# Patient Record
Sex: Male | Born: 1965 | Race: White | Hispanic: No | Marital: Married | Smoking: Never smoker
Health system: Southern US, Community
[De-identification: ages and names within clinical notes are randomized; demographics above are authoritative.]

## PROBLEM LIST (undated history)

## (undated) DIAGNOSIS — M109 Gout, unspecified: Secondary | ICD-10-CM

## (undated) DIAGNOSIS — K219 Gastro-esophageal reflux disease without esophagitis: Secondary | ICD-10-CM

## (undated) DIAGNOSIS — E785 Hyperlipidemia, unspecified: Secondary | ICD-10-CM

## (undated) DIAGNOSIS — I10 Essential (primary) hypertension: Secondary | ICD-10-CM

## (undated) HISTORY — PX: ABDOMINAL HERNIA REPAIR: SHX539

## (undated) HISTORY — DX: Gastro-esophageal reflux disease without esophagitis: K21.9

## (undated) HISTORY — DX: Essential (primary) hypertension: I10

## (undated) HISTORY — PX: HERNIA REPAIR: SHX51

---

## 2000-03-22 ENCOUNTER — Ambulatory Visit (HOSPITAL_BASED_OUTPATIENT_CLINIC_OR_DEPARTMENT_OTHER): Admission: RE | Admit: 2000-03-22 | Discharge: 2000-03-23 | Payer: Self-pay | Admitting: General Surgery

## 2012-10-14 ENCOUNTER — Ambulatory Visit (INDEPENDENT_AMBULATORY_CARE_PROVIDER_SITE_OTHER): Payer: BC Managed Care – PPO

## 2012-10-14 ENCOUNTER — Other Ambulatory Visit: Payer: Self-pay | Admitting: Orthopedic Surgery

## 2012-10-14 DIAGNOSIS — M25559 Pain in unspecified hip: Secondary | ICD-10-CM

## 2012-10-14 DIAGNOSIS — R52 Pain, unspecified: Secondary | ICD-10-CM

## 2013-05-26 LAB — LIPID PANEL
Cholesterol: 178 mg/dL (ref 0–200)
HDL: 30 mg/dL — AB (ref 35–70)
LDL Cholesterol: 118 mg/dL
Triglycerides: 149 mg/dL (ref 40–160)

## 2013-05-26 LAB — CBC AND DIFFERENTIAL
HCT: 13 % — AB (ref 41–53)
Hemoglobin: 40 g/dL — AB (ref 13.5–17.5)
PLATELETS: 184 10*3/uL (ref 150–399)
WBC: 5.8 10^3/mL

## 2013-05-26 LAB — BASIC METABOLIC PANEL
Creatinine: 1.2 mg/dL (ref 0.6–1.3)
Glucose: 103 mg/dL
Potassium: 3.9 mmol/L (ref 3.4–5.3)
Sodium: 141 mmol/L (ref 137–147)

## 2013-05-26 LAB — HEPATIC FUNCTION PANEL
ALT: 20 U/L (ref 10–40)
AST: 18 U/L (ref 14–40)

## 2013-05-26 LAB — TSH: TSH: 1.83 u[IU]/mL (ref 0.41–5.90)

## 2014-05-07 ENCOUNTER — Ambulatory Visit (INDEPENDENT_AMBULATORY_CARE_PROVIDER_SITE_OTHER): Payer: BC Managed Care – PPO | Admitting: Family Medicine

## 2014-05-07 ENCOUNTER — Encounter: Payer: Self-pay | Admitting: Family Medicine

## 2014-05-07 VITALS — BP 131/86 | HR 84 | Ht 68.0 in | Wt 188.0 lb

## 2014-05-07 DIAGNOSIS — I10 Essential (primary) hypertension: Secondary | ICD-10-CM

## 2014-05-07 DIAGNOSIS — K219 Gastro-esophageal reflux disease without esophagitis: Secondary | ICD-10-CM | POA: Insufficient documentation

## 2014-05-07 DIAGNOSIS — R252 Cramp and spasm: Secondary | ICD-10-CM

## 2014-05-07 DIAGNOSIS — R079 Chest pain, unspecified: Secondary | ICD-10-CM

## 2014-05-07 NOTE — Progress Notes (Signed)
CC: Damon Conrad is a 48 y.o. male is here for Establish Care and Chest Pain   Subjective: HPI:  Pleasant 48 year old here to establish care  Patient reports episodes of chest pain have been occurring over the past one to 2 months. They're not predictable. He describes 2 distinct recurring episodes one of which is described as a sharpness in his left breast that occurs for only a second and comes and goes in both exertional and rest situations. He's only had 2 of these over the last 2 months. He also describes a chest heaviness that comes on gradually and will persist for about 30 minutes nothing particularly makes better or worse it has only occurred when he is exerting himself at work though. He believes he's had a handful of these episodes over the past 2 months.  For the past week he's begun starting taking an aspirin daily but there's been no other interventions. He is not a smoker and has no family history of heart disease. He denies any pulmonary complaints or muscle skeletal complaints, specifically he's been unable to reproduce any component of his above pains by pressing on his chest.  Complains of right calf cramping that occurs most nights of the week over the past months. He described as a severe tightness that improves with stretching. Nothing particular makes better or worse, he's tried stopping his blood pressure medication for a few days however it did not help or worsen his cramping. He denies swelling of the extremities nor edema. Denies weakness or limb claudication when active  Review of Systems - General ROS: negative for - chills, fever, night sweats, weight gain or weight loss Ophthalmic ROS: negative for - decreased vision Psychological ROS: negative for - anxiety or depression ENT ROS: negative for - hearing change, nasal congestion, tinnitus or allergies Hematological and Lymphatic ROS: negative for - bleeding problems, bruising or swollen lymph nodes Breast ROS:  negative Respiratory ROS: no cough, shortness of breath, or wheezing Cardiovascular ROS: no dyspnea on exertion Gastrointestinal ROS: no abdominal pain, change in bowel habits, or black or bloody stools Genito-Urinary ROS: negative for - genital discharge, genital ulcers, incontinence or abnormal bleeding from genitals Musculoskeletal ROS: negative for - joint pain or muscle pain other than that described above Neurological ROS: negative for - headaches or memory loss Dermatological ROS: negative for lumps, mole changes, rash and skin lesion changes  History reviewed. No pertinent past medical history.  Past Surgical History  Procedure Laterality Date  . Abdominal hernia repair      x 2   Family History  Problem Relation Age of Onset  . Alcohol abuse Father   . Cancer Mother   . Hypertension Mother     History   Social History  . Marital Status: Single    Spouse Name: N/A    Number of Children: N/A  . Years of Education: N/A   Occupational History  . Not on file.   Social History Main Topics  . Smoking status: Never Smoker   . Smokeless tobacco: Not on file  . Alcohol Use: No  . Drug Use: No  . Sexual Activity: Yes    Partners: Female   Other Topics Concern  . Not on file   Social History Narrative  . No narrative on file     Objective: BP 131/86  Pulse 84  Ht 5\' 8"  (1.727 m)  Wt 188 lb (85.276 kg)  BMI 28.59 kg/m2  General: Alert and Oriented, No Acute Distress  HEENT: Pupils equal, round, reactive to light. Conjunctivae clear.  Moist membranes pharynx unremarkable Lungs: Clear to auscultation bilaterally, no wheezing/ronchi/rales.  Comfortable work of breathing. Good air movement. Cardiac: Regular rate and rhythm. Normal S1/S2.  No murmurs, rubs, nor gallops.  No carotid bruits Abdomen: Mild obesity soft nontender Extremities: No peripheral edema.  Strong peripheral pulses. Full range of motion and strength of the right knee and ankle. Pain is not  reproduced with palpation of the gastroc muscle belly. No overlying skin changes at the site of his calf pain. Mental Status: No depression, anxiety, nor agitation. Skin: Warm and dry. No overlying skin changes on the chest  Assessment & Plan: Damon Conrad was seen today for establish care and chest pain.  Diagnoses and associated orders for this visit:  Essential hypertension, benign  Gastroesophageal reflux disease, esophagitis presence not specified  Chest pain, unspecified chest pain type - PR ELECTROCARDIOGRAM, COMPLETE - COMPLETE METABOLIC PANEL WITH GFR - CBC - Lipid panel - Ambulatory referral to Cardiology  Calf cramp    Essential hypertension: Controlled continue Diovan pending metabolic panel Calf cramp: High suspicion of electrolyte abnormality therefore checking metabolic panel GERD: Continue Nexium Chest pain: Rule out anemia, checking labs above to look for cardiac risk factors. Discussed my suspicion that his chest pressure is angina. I encouraged him to continue taking aspirin daily until he can be seen by cardiology, I believe it is very likely that he will need a stress test and I have prepared him for this possibility. An EKG was obtained showing normal sinus rhythm, normal axis, normal intervals, a Q wave in III but no other pathologic Q waves. There was no ST segment elevation or depression, T wave morphology was unremarkable.   Return in about 4 weeks (around 06/04/2014).

## 2014-05-08 ENCOUNTER — Encounter: Payer: Self-pay | Admitting: Family Medicine

## 2014-05-08 DIAGNOSIS — E785 Hyperlipidemia, unspecified: Secondary | ICD-10-CM | POA: Insufficient documentation

## 2014-05-08 LAB — COMPLETE METABOLIC PANEL WITH GFR
ALBUMIN: 3.8 g/dL (ref 3.5–5.2)
ALT: 22 U/L (ref 0–53)
AST: 22 U/L (ref 0–37)
Alkaline Phosphatase: 67 U/L (ref 39–117)
BUN: 17 mg/dL (ref 6–23)
CALCIUM: 9.3 mg/dL (ref 8.4–10.5)
CHLORIDE: 102 meq/L (ref 96–112)
CO2: 27 mEq/L (ref 19–32)
Creat: 1.25 mg/dL (ref 0.50–1.35)
GFR, Est African American: 78 mL/min
GFR, Est Non African American: 67 mL/min
Glucose, Bld: 90 mg/dL (ref 70–99)
POTASSIUM: 3.8 meq/L (ref 3.5–5.3)
SODIUM: 139 meq/L (ref 135–145)
TOTAL PROTEIN: 7.2 g/dL (ref 6.0–8.3)
Total Bilirubin: 0.5 mg/dL (ref 0.2–1.2)

## 2014-05-08 LAB — CBC
HCT: 41.2 % (ref 39.0–52.0)
Hemoglobin: 13.8 g/dL (ref 13.0–17.0)
MCH: 29.3 pg (ref 26.0–34.0)
MCHC: 33.5 g/dL (ref 30.0–36.0)
MCV: 87.5 fL (ref 78.0–100.0)
PLATELETS: 194 10*3/uL (ref 150–400)
RBC: 4.71 MIL/uL (ref 4.22–5.81)
RDW: 13.7 % (ref 11.5–15.5)
WBC: 6.7 10*3/uL (ref 4.0–10.5)

## 2014-05-08 LAB — LIPID PANEL
Cholesterol: 168 mg/dL (ref 0–200)
HDL: 33 mg/dL — ABNORMAL LOW (ref >39)
LDL CALC: 92 mg/dL (ref 0–99)
Total CHOL/HDL Ratio: 5.1 Ratio
Triglycerides: 213 mg/dL — ABNORMAL HIGH (ref <150)
VLDL: 43 mg/dL — ABNORMAL HIGH (ref 0–40)

## 2014-05-15 ENCOUNTER — Encounter: Payer: Self-pay | Admitting: *Deleted

## 2014-05-16 ENCOUNTER — Ambulatory Visit (INDEPENDENT_AMBULATORY_CARE_PROVIDER_SITE_OTHER): Payer: PRIVATE HEALTH INSURANCE | Admitting: Cardiology

## 2014-05-16 ENCOUNTER — Ambulatory Visit: Payer: PRIVATE HEALTH INSURANCE | Admitting: Cardiology

## 2014-05-16 ENCOUNTER — Encounter: Payer: Self-pay | Admitting: Cardiology

## 2014-05-16 VITALS — BP 130/86 | HR 82 | Ht 68.0 in | Wt 190.0 lb

## 2014-05-16 DIAGNOSIS — R079 Chest pain, unspecified: Secondary | ICD-10-CM

## 2014-05-16 DIAGNOSIS — I10 Essential (primary) hypertension: Secondary | ICD-10-CM

## 2014-05-16 NOTE — Assessment & Plan Note (Signed)
Blood pressure controlled. Continue present medications. 

## 2014-05-16 NOTE — Patient Instructions (Signed)
Your physician recommends that you schedule a follow-up appointment in: AS NEEDED  Your physician has requested that you have an exercise tolerance test. For further information please visit [email or web address]. Please also follow instruction sheet, as given.   Exercise Stress Electrocardiogram An exercise stress electrocardiogram is a test to check how blood flows to your heart. It is done to find areas of poor blood flow. You will need to walk on a treadmill for this test. The electrocardiogram will record your heartbeat when you are at rest and when you are exercising. BEFORE THE PROCEDURE  Do not have drinks with caffeine or foods with caffeine for 24 hours before the test, or as told by your doctor.  Follow your doctor's instructions about eating and drinking before the test.  Ask your doctor what medicines you should or should not take before the test. Take your medicines with water unless told by your doctor not to.  If you use an inhaler, bring it with you to the test.  Bring a snack to eat after the test.  Do not  smoke for 4 hours before the test.  Wear comfortable shoes and clothing. PROCEDURE  You will have patches put on your chest. Small areas of your chest may need to be shaved. Wires will be connected to the patches.  Your heart rate will be watched while you are resting and while you are exercising.  You will walk on the treadmill. The treadmill will slowly get faster to raise your heart rate.  The test will take about 1-2 hours. AFTER THE PROCEDURE  Your heart rate and blood pressure will be watched after the test.  You may return to your normal diet, activities, and medicines or as told by your doctor. Document Released: 04/06/2008 Document Revised: 08/09/2013 Document Reviewed: 06/26/2013 ExitCare Patient Information 2015 [employer]. This information is not intended to replace advice given to you by your health care provider. Make sure you discuss any  questions you have with your health care provider.   

## 2014-05-16 NOTE — Progress Notes (Signed)
     HPI: 48 year old male for evaluation of chest pain. Laboratories in July of 2015 revealed Normal potassium, liver functions and hemoglobin. Patient has had intermittent chest pain for the past 2-3 months. The pain is in the left breast area. It is described as an ache and occasional sharp pain. There is also occasional pressure. The pain is not pleuritic, positional, related to food or exertional. No radiation. No associated symptoms. A sharp pain lasts 1-2 seconds. He had pressure that lasted one hour. He has not had pain since July 6. He did not have dyspnea on exertion, orthopnea, PND, pedal edema, syncope.  Current Outpatient Prescriptions  Medication Sig Dispense Refill  . aspirin 325 MG tablet Take 325 mg by mouth daily.      Marland Kitchen. esomeprazole (NEXIUM) 20 MG capsule Take 1 capsule (20 mg total) by mouth daily at 12 noon.      . valsartan-hydrochlorothiazide (DIOVAN HCT) 80-12.5 MG per tablet 1/2 tab po qd       No current facility-administered medications for this visit.    No Known Allergies  Past Medical History  Diagnosis Date  . HTN (hypertension), benign   . GERD (gastroesophageal reflux disease)     Past Surgical History  Procedure Laterality Date  . Abdominal hernia repair      x 2    History   Social History  . Marital Status: Single    Spouse Name: N/A    Number of Children: N/A  . Years of Education: N/A   Occupational History  . Not on file.   Social History Main Topics  . Smoking status: Never Smoker   . Smokeless tobacco: Not on file  . Alcohol Use: Yes     Comment: Rarely  . Drug Use: No  . Sexual Activity: Yes    Partners: Female   Other Topics Concern  . Not on file   Social History Narrative  . No narrative on file    Family History  Problem Relation Age of Onset  . Alcohol abuse Father   . Cancer Mother   . Hypertension Mother     ROS: no fevers or chills, productive cough, hemoptysis, dysphasia, odynophagia, melena,  hematochezia, dysuria, hematuria, rash, seizure activity, orthopnea, PND, pedal edema, claudication. Remaining systems are negative.  Physical Exam:   Blood pressure 130/86, pulse 82, height 5\' 8"  (1.727 m), weight 190 lb (86.183 kg).  General:  Well developed/well nourished in NAD Skin warm/dry Patient not depressed No peripheral clubbing Back-normal HEENT-normal/normal eyelids Neck supple/normal carotid upstroke bilaterally; no bruits; no JVD; no thyromegaly chest - CTA/ normal expansion CV - RRR/normal S1 and S2; no murmurs, rubs or gallops;  PMI nondisplaced Abdomen -NT/ND, no HSM, no mass, + bowel sounds, no bruit 2+ femoral pulses, no bruits Ext-no edema, chords, 2+ DP Neuro-grossly nonfocal  ECG 05/07/2014-sinus rhythm with no ST changes.

## 2014-05-16 NOTE — Assessment & Plan Note (Signed)
Symptoms are atypical.Electrocardiogram normal. Plan exercise treadmill for risk stratification. 

## 2014-05-24 ENCOUNTER — Encounter: Payer: Self-pay | Admitting: Family Medicine

## 2014-06-05 LAB — ANA
ANA TITER ADD: NEGATIVE
CCP IGG ANTIBODIES: 12
SED RATE: 21

## 2014-06-14 ENCOUNTER — Encounter (HOSPITAL_COMMUNITY): Payer: PRIVATE HEALTH INSURANCE

## 2014-06-19 ENCOUNTER — Telehealth (HOSPITAL_COMMUNITY): Payer: Self-pay

## 2014-06-19 NOTE — Telephone Encounter (Signed)
Encounter complete. 

## 2014-06-20 ENCOUNTER — Ambulatory Visit (HOSPITAL_COMMUNITY)
Admission: RE | Admit: 2014-06-20 | Discharge: 2014-06-20 | Disposition: A | Discharge status: Home / Self Care | Payer: PRIVATE HEALTH INSURANCE | Source: Ambulatory Visit | Attending: Cardiology | Admitting: Cardiology

## 2014-06-20 DIAGNOSIS — R079 Chest pain, unspecified: Secondary | ICD-10-CM | POA: Diagnosis not present

## 2014-06-20 NOTE — Procedures (Addendum)
Exercise Treadmill Test  Test  Exercise Tolerance Test Ordering MD: Olga MillersBrian Crenshaw, MD  Interpreting MD:   Unique Test No: 1  Treadmill:  1  Indication for ETT: chest pain - rule out ischemia  Contraindication to ETT: No   Stress Modality: exercise - treadmill  Cardiac Imaging Performed: non   Protocol: standard Bruce - maximal  Max BP:  162/77  Max MPHR (bpm): 171 85% MPR (bpm):  145  MPHR obtained (bpm):  155 % MPHR obtained:  91  Reached 85% MPHR (min:sec): 7:55 Total Exercise Time (min-sec):  9:00  Workload in METS:  10.10 Borg Scale:   Reason ETT Terminated:  fatigue    ST Segment Analysis At Rest: normal ST segments - no evidence of significant ST depression With Exercise: upsloping ST segment depression  Other Information Arrhythmia:  No Angina during ETT:  absent (0) Quality of ETT:  diagnostic  ETT Interpretation:  normal - no evidence of ischemia by ST analysis  Comments: <1 mm upsloping ST depression with exercise Good exercise tolerance Normal BP response to exercise No chest pain  Chrystie NoseKenneth C. Vyctoria Dickman, MD, [facility] Attending Cardiologist [facility]

## 2014-10-24 ENCOUNTER — Other Ambulatory Visit: Payer: Self-pay | Admitting: Family Medicine

## 2014-10-24 ENCOUNTER — Ambulatory Visit (INDEPENDENT_AMBULATORY_CARE_PROVIDER_SITE_OTHER): Payer: PRIVATE HEALTH INSURANCE

## 2014-10-24 ENCOUNTER — Ambulatory Visit (INDEPENDENT_AMBULATORY_CARE_PROVIDER_SITE_OTHER): Payer: PRIVATE HEALTH INSURANCE | Admitting: Family Medicine

## 2014-10-24 ENCOUNTER — Encounter: Payer: Self-pay | Admitting: Family Medicine

## 2014-10-24 VITALS — BP 143/90 | HR 59 | Wt 187.0 lb

## 2014-10-24 DIAGNOSIS — I10 Essential (primary) hypertension: Secondary | ICD-10-CM

## 2014-10-24 DIAGNOSIS — M79672 Pain in left foot: Secondary | ICD-10-CM

## 2014-10-24 DIAGNOSIS — M7732 Calcaneal spur, left foot: Secondary | ICD-10-CM

## 2014-10-24 MED ORDER — MELOXICAM 15 MG PO TABS: 15.0000 mg | ORAL_TABLET | Freq: Every day | ORAL | Status: DC

## 2014-10-24 MED ORDER — METOPROLOL SUCCINATE ER 25 MG PO TB24: 25.0000 mg | ORAL_TABLET | Freq: Every day | ORAL | Status: DC

## 2014-10-24 NOTE — Progress Notes (Signed)
CC: Damon Conrad is a 48 y.o. male is here for left foot pain   Subjective: HPI:  Left heel pain localized on the bottom of the heel it is nonradiating that has been present for one month on a daily basis. Slightly improved with Tylenol. He's changed his shoes from [employer] to new balance with no benefit or worsening of the pain. It is barely noticeable when resting however whenever weightbearing it is worse. He denies any recent overuse, trauma or accidents. Denies joint pain elsewhere. Denies any overlying skin changes at the site of discomfort.  Follow-up essential hypertension: Patient did some experimentation at home and noticed that he had chest pain only on the days that he was taking valsartan-hydrochlorothiazide. On days that he did not take the see had absolutely no pain in his right chest similar to the pain he was experiencing when I first met him. Since I saw him last he had an exercise stress test that was reportedly negative. Nothing else seems to influence his chest pain. He denies cough, shortness of breath, or peripheral edema   Review Of Systems Outlined In HPI  Past Medical History  Diagnosis Date  . HTN (hypertension), benign   . GERD (gastroesophageal reflux disease)     Past Surgical History  Procedure Laterality Date  . Abdominal hernia repair Bilateral     x 2   Family History  Problem Relation Age of Onset  . Alcohol abuse Father   . Cancer Mother   . Hypertension Mother     History   Social History  . Marital Status: Single    Spouse Name: N/A    Number of Children: N/A  . Years of Education: N/A   Occupational History  . Not on file.   Social History Main Topics  . Smoking status: Never Smoker   . Smokeless tobacco: Not on file  . Alcohol Use: Yes     Comment: Rarely  . Drug Use: No  . Sexual Activity:    Partners: Female   Other Topics Concern  . Not on file   Social History Narrative     Objective: BP 143/90 mmHg  Pulse 59  Wt 187 lb  (84.823 kg)  General: Alert and Oriented, No Acute Distress HEENT: Pupils equal, round, reactive to light. Conjunctivae clear.  Moist mucous membranes Lungs: Clear and comfortable work of breathing Cardiac: Regular rate and rhythm.  Left foot: No pain at the lateral or medial malleoli. No pain at the base of the fifth metatarsal. No pain with passive inversion or eversion of the ankle. No pain at the posterior aspect of the calcaneus. Pain is reproduced with firm palpation at the inferior calcaneus. No overlying skin changes at site of discomfort Extremities: No peripheral edema.  Strong peripheral pulses.  Mental Status: No depression, anxiety, nor agitation. Skin: Warm and dry.  Assessment & Plan: Damon Conrad was seen today for left foot pain.  Diagnoses and associated orders for this visit:  Heel pain, left - meloxicam (MOBIC) 15 MG tablet; Take 1 tablet (15 mg total) by mouth daily. - DG Foot 2 Views Left; Future  Essential hypertension - metoprolol succinate (TOPROL-XL) 25 MG 24 hr tablet; Take 1 tablet (25 mg total) by mouth daily.    Heel pain: Suspect plantar fasciitis versus stress fracture. Will obtain x-ray today and if normal begin meloxicam and home rehabilitative exercises that were provided to the patient. We will call him with results later today if available Essential hypertension: Uncontrolled  chronic condition agree with this decision to stop valsartan-hydrochlorothiazide, begin metoprolol   Return in about 3 months (around 01/23/2015) for BP.

## 2014-11-13 ENCOUNTER — Telehealth: Payer: Self-pay

## 2014-11-13 NOTE — Telephone Encounter (Signed)
Damon FearingJames states he had an allergic reaction to meloxicam. He has a rash on both legs. He stopped taking the medication and the rash resolved. He restarted the meloxicam and the rash returned. He would like a different medication. Please advise.

## 2014-11-14 MED ORDER — DICLOFENAC SODIUM 50 MG PO TBEC: DELAYED_RELEASE_TABLET | ORAL | Status: DC

## 2014-11-14 NOTE — Telephone Encounter (Signed)
Left detailed message.   

## 2014-11-14 NOTE — Telephone Encounter (Signed)
Diclofenac has been sent to his [facility] as a substitution for meloxicam.

## 2015-01-03 ENCOUNTER — Ambulatory Visit (INDEPENDENT_AMBULATORY_CARE_PROVIDER_SITE_OTHER): Payer: PRIVATE HEALTH INSURANCE | Admitting: Family Medicine

## 2015-01-03 ENCOUNTER — Encounter: Payer: Self-pay | Admitting: Family Medicine

## 2015-01-03 ENCOUNTER — Ambulatory Visit (INDEPENDENT_AMBULATORY_CARE_PROVIDER_SITE_OTHER): Payer: PRIVATE HEALTH INSURANCE

## 2015-01-03 VITALS — BP 138/91 | HR 68 | Wt 189.0 lb

## 2015-01-03 DIAGNOSIS — R079 Chest pain, unspecified: Secondary | ICD-10-CM

## 2015-01-03 DIAGNOSIS — B9689 Other specified bacterial agents as the cause of diseases classified elsewhere: Secondary | ICD-10-CM

## 2015-01-03 DIAGNOSIS — I10 Essential (primary) hypertension: Secondary | ICD-10-CM

## 2015-01-03 DIAGNOSIS — K219 Gastro-esophageal reflux disease without esophagitis: Secondary | ICD-10-CM

## 2015-01-03 DIAGNOSIS — A499 Bacterial infection, unspecified: Secondary | ICD-10-CM

## 2015-01-03 DIAGNOSIS — J329 Chronic sinusitis, unspecified: Secondary | ICD-10-CM

## 2015-01-03 LAB — CBC WITH DIFFERENTIAL/PLATELET
Basophils Absolute: 0 10*3/uL (ref 0.0–0.1)
Basophils Relative: 0 % (ref 0–1)
EOS ABS: 0.1 10*3/uL (ref 0.0–0.7)
Eosinophils Relative: 2 % (ref 0–5)
HEMATOCRIT: 42.2 % (ref 39.0–52.0)
Hemoglobin: 14.3 g/dL (ref 13.0–17.0)
LYMPHS ABS: 1.9 10*3/uL (ref 0.7–4.0)
Lymphocytes Relative: 27 % (ref 12–46)
MCH: 29.9 pg (ref 26.0–34.0)
MCHC: 33.9 g/dL (ref 30.0–36.0)
MCV: 88.1 fL (ref 78.0–100.0)
MONO ABS: 0.7 10*3/uL (ref 0.1–1.0)
MONOS PCT: 10 % (ref 3–12)
MPV: 10.5 fL (ref 8.6–12.4)
Neutro Abs: 4.2 10*3/uL (ref 1.7–7.7)
Neutrophils Relative %: 61 % (ref 43–77)
Platelets: 189 10*3/uL (ref 150–400)
RBC: 4.79 MIL/uL (ref 4.22–5.81)
RDW: 13.9 % (ref 11.5–15.5)
WBC: 6.9 10*3/uL (ref 4.0–10.5)

## 2015-01-03 LAB — COMPLETE METABOLIC PANEL WITH GFR
ALBUMIN: 4 g/dL (ref 3.5–5.2)
ALT: 23 U/L (ref 0–53)
AST: 18 U/L (ref 0–37)
Alkaline Phosphatase: 67 U/L (ref 39–117)
BUN: 12 mg/dL (ref 6–23)
CALCIUM: 9.1 mg/dL (ref 8.4–10.5)
CHLORIDE: 104 meq/L (ref 96–112)
CO2: 27 mEq/L (ref 19–32)
Creat: 1.07 mg/dL (ref 0.50–1.35)
GFR, Est African American: >89 mL/min
GFR, Est Non African American: 81 mL/min
Glucose, Bld: 95 mg/dL (ref 70–99)
POTASSIUM: 4.6 meq/L (ref 3.5–5.3)
Sodium: 139 mEq/L (ref 135–145)
TOTAL PROTEIN: 6.8 g/dL (ref 6.0–8.3)
Total Bilirubin: 0.6 mg/dL (ref 0.2–1.2)

## 2015-01-03 MED ORDER — ESOMEPRAZOLE MAGNESIUM 20 MG PO CPDR: 20.0000 mg | DELAYED_RELEASE_CAPSULE | Freq: Every day | ORAL | Status: DC

## 2015-01-03 MED ORDER — METOPROLOL SUCCINATE ER 50 MG PO TB24: 50.0000 mg | ORAL_TABLET | Freq: Every day | ORAL | Status: DC

## 2015-01-03 MED ORDER — LEVOFLOXACIN 500 MG PO TABS: 500.0000 mg | ORAL_TABLET | Freq: Every day | ORAL | Status: DC

## 2015-01-03 NOTE — Progress Notes (Signed)
CC: Damon Conrad is a 49 y.o. male is here for Hypertension and f/u chest pain   Subjective: HPI:  Follow-up of GERD: Since I saw him last he ran out of Nexium. He had a return of an acidic sensation in the back of his throat that radiates down to his epigastric region. There is no exertional component to this pain. It's worse with spicy food. Pain is nonradiating other than that described above. Nothing else makes better or worse. It's mild in severity he is requesting refills  Follow-up chest pain: Since I saw him last he denies any chest pain other than an episode this last Saturday where he felt like his left breast was swelling and throbbing. Pain is different than the chest pain he experienced which led to a unremarkable exercise stress test. Nothing seemed to make his pain recently better or worse.  It went away after a few hours without any intervention. There is no exertional component to his pain. It was not accompanied by shortness of breath wheezing or any other motor or sensory disturbances. There were no overlying skin changes at the site of his discomfort.  Follow-up essential hypertension: He's been checking his blood pressure at home over the past week it has been in the high stage I and low stage II hypertensive range. He's been taking metoprolol 25 mg daily without any known side effects.  Complains of facial pressure localized in the forehead, thick nasal discharge, nonproductive cough and sore throat on the left and fatigue that has been present since late last week. Symptoms came on abruptly and have not been getting any better or worse since onset currently moderate in severity. No benefit from over-the-counter cough and cold products. Denies blood in sputum or productive cough. Only chest pain as that described above. Does not hurt to breathe and has had no shortness of breath or wheezing.  He is extremely concerned that these symptoms are suggestive of a lung cancer. There's been  no unintentional weight loss or persistent chest pain.  Review Of Systems Outlined In HPI  Past Medical History  Diagnosis Date  . HTN (hypertension), benign   . GERD (gastroesophageal reflux disease)     Past Surgical History  Procedure Laterality Date  . Abdominal hernia repair Bilateral     x 2   Family History  Problem Relation Age of Onset  . Alcohol abuse Father   . Cancer Mother   . Hypertension Mother     History   Social History  . Marital Status: Single    Spouse Name: N/A  . Number of Children: N/A  . Years of Education: N/A   Occupational History  . Not on file.   Social History Main Topics  . Smoking status: Never Smoker   . Smokeless tobacco: Not on file  . Alcohol Use: Yes     Comment: Rarely  . Drug Use: No  . Sexual Activity:    Partners: Female   Other Topics Concern  . Not on file   Social History Narrative     Objective: BP 138/91 mmHg  Pulse 68  Wt 189 lb (85.73 kg)  General: Alert and Oriented, No Acute Distress HEENT: Pupils equal, round, reactive to light. Conjunctivae clear.  External ears unremarkable, canals clear with intact TMs with appropriate landmarks.  Middle ear appears open without effusion. Pink inferior turbinates.  Moist mucous membranes, pharynx with mild left-sided erythema around the tonsils but no tonsillar hypertrophy or deviated uvula..  Neck  supple without palpable lymphadenopathy nor abnormal masses. Voice is hoarse Lungs: Clear to auscultation bilaterally, no wheezing/ronchi/rales.  Comfortable work of breathing. Good air movement. Cardiac: Regular rate and rhythm. Normal S1/S2.  No murmurs, rubs, nor gallops.   Chest: No palpable abnormality at the site of his remote chest pain nor reproduction of chest pain on the left breast Extremities: No peripheral edema.  Strong peripheral pulses.  Mental Status: No depression nor agitation. Mild anxiety Skin: Warm and dry.  Assessment & Plan: Tasha was seen today for  hypertension and f/u chest pain.  Diagnoses and all orders for this visit:  Gastroesophageal reflux disease, esophagitis presence not specified Orders: -     esomeprazole (NEXIUM) 20 MG capsule; Take 1 capsule (20 mg total) by mouth daily.  Chest pain, unspecified chest pain type Orders: -     CBC w/Diff -     COMPLETE METABOLIC PANEL WITH GFR -     DG Chest 2 View; Future  Essential hypertension, benign  Essential hypertension Orders: -     Discontinue: metoprolol succinate (TOPROL-XL) 50 MG 24 hr tablet; Take 1 tablet (50 mg total) by mouth daily. -     metoprolol succinate (TOPROL-XL) 50 MG 24 hr tablet; Take 1 tablet (50 mg total) by mouth daily.  Bacterial sinusitis Orders: -     levofloxacin (LEVAQUIN) 500 MG tablet; Take 1 tablet (500 mg total) by mouth daily.   GERD: Uncontrolled chronic condition restart Nexium Chest pain: Reassurance provided that it sounds like his pain was either costochondritis or muscle strain very low suspicion of cardiac disease. Will rule out anemia with CBC. Given his strong concern for lung cancer we'll check chest x-ray and metabolic panel . Essential hypertension: Uncontrolled chronic condition increasing metoprolol Bacterial sinusitis: Start levofloxacin, reassurance was provided that his recent symptoms do not suggest any concern for lung cancer however given his chest pain and extreme concern for this agreed to do blood work and chest x-ray above. Start levofloxacin for sinusitis. Consider nasal saline washes as well  40 minutes spent face-to-face during visit today of which at least 50% was counseling or coordinating care regarding: 1. Gastroesophageal reflux disease, esophagitis presence not specified   2. Chest pain, unspecified chest pain type   3. Essential hypertension, benign   4. Essential hypertension   5. Bacterial sinusitis      Return in about 3 months (around 04/05/2015).

## 2015-02-12 ENCOUNTER — Ambulatory Visit: Payer: PRIVATE HEALTH INSURANCE | Admitting: Family Medicine

## 2015-02-20 ENCOUNTER — Ambulatory Visit (INDEPENDENT_AMBULATORY_CARE_PROVIDER_SITE_OTHER): Payer: PRIVATE HEALTH INSURANCE

## 2015-02-20 ENCOUNTER — Encounter: Payer: Self-pay | Admitting: Family Medicine

## 2015-02-20 ENCOUNTER — Ambulatory Visit (INDEPENDENT_AMBULATORY_CARE_PROVIDER_SITE_OTHER): Payer: PRIVATE HEALTH INSURANCE | Admitting: Family Medicine

## 2015-02-20 VITALS — BP 133/85 | HR 64 | Wt 190.0 lb

## 2015-02-20 DIAGNOSIS — M7918 Myalgia, other site: Secondary | ICD-10-CM

## 2015-02-20 DIAGNOSIS — M791 Myalgia: Secondary | ICD-10-CM | POA: Diagnosis not present

## 2015-02-20 DIAGNOSIS — M25551 Pain in right hip: Secondary | ICD-10-CM

## 2015-02-20 MED ORDER — KETOROLAC TROMETHAMINE 60 MG/2ML IM SOLN
60.0000 mg | Freq: Once | INTRAMUSCULAR | Status: AC
  Administered 2015-02-20: 60 mg via INTRAMUSCULAR

## 2015-02-20 MED ORDER — TRAMADOL HCL 50 MG PO TABS
50.0000 mg | ORAL_TABLET | Freq: Three times a day (TID) | ORAL | Status: DC | PRN
Start: 2015-02-20 — End: 2015-03-07

## 2015-02-20 NOTE — Progress Notes (Signed)
CC: Damon Conrad is a 49 y.o. male is here for right hip pain   Subjective: HPI:  Right hip pain that has been present for about a month now. He describes it as localized in the posterior hip around the buttock. It begins 30 seconds after he has been standing. It's worse with walking and improves greatly but slowly if he sits down. After 30 minutes of bearing with the pain while walking it will slowly begin to subside but never goes away while walking. It radiates down the lateral side of the hip andthigh and approaches the knee but never goes beyond the knee. He had similar pain a little over a year ago and he tells me he had a MRI of his lumbar spine which was normal. The orthopedist that he was seen at that time believe that maybe something was wrong with the leg and a steroid injection was given somewhere in the leg but this was not beneficial. He denies any motor or sensory disturbances other than that described above in the right leg. There's been no recent or remote trauma. He denies midline back pain. No  Relief from Tylenol, Tylenol, Aleve or diclofenac. No gastrointestinal complaints or genitourinary complaints.   Review Of Systems Outlined In HPI  Past Medical History  Diagnosis Date  . HTN (hypertension), benign   . GERD (gastroesophageal reflux disease)     Past Surgical History  Procedure Laterality Date  . Abdominal hernia repair Bilateral     x 2   Family History  Problem Relation Age of Onset  . Alcohol abuse Father   . Cancer Mother   . Hypertension Mother     History   Social History  . Marital Status: Single    Spouse Name: Damon Conrad  . Number of Children: Damon Conrad  . Years of Education: Damon Conrad   Occupational History  . Not on file.   Social History Main Topics  . Smoking status: Never Smoker   . Smokeless tobacco: Not on file  . Alcohol Use: Yes     Comment: Rarely  . Drug Use: No  . Sexual Activity:    Partners: Female   Other Topics Concern  . Not on file    Social History Narrative     Objective: BP 133/85 mmHg  Pulse 64  Wt 190 lb (86.183 kg)  Vital signs reviewed. General: Alert and Oriented, No Acute Distress HEENT: Pupils equal, round, reactive to light. Conjunctivae clear.  External ears unremarkable.  Moist mucous membranes. Lungs: Clear and comfortable work of breathing, speaking in full sentences without accessory muscle use. Cardiac: Regular rate and rhythm.  Neuro: CN II-XII grossly intact, gait normal. Extremities: No peripheral edema.  Strong peripheral pulses.  Right leg exam: Full range of motion and strength in the right lower extremity. No pain with internal or external rotation of the femur. Faber positive, FADIR positive, no pain with forced directed into the acetabulum with internal and external rotation. Negative straight leg raise. Pain is reproduced with palpation of the right SI joint. Mental Status: No depression, anxiety, nor agitation. Logical though process. Skin: Warm and dry.  Assessment & Plan: [name] was seen today for right hip pain.  Diagnoses and all orders for this visit:  Right buttock pain Orders: -     traMADol (ULTRAM) 50 MG tablet; Take 1 tablet (50 mg total) by mouth every 8 (eight) hours as needed. -     DG HIP UNILAT WITH PELVIS 2-3 VIEWS RIGHT; Future  Right buttock pain is suspicious for SI joint arthritis therefore x-rays will be obtained to confirm this. If confirmed will refer to Dr. Karie Schwalbe in sports medicine for consideration of injection. He received 60 mg of Toradol here in the clinic and also a prescription for tramadol, sedation warning provided.  Return if symptoms worsen or fail to improve.

## 2015-02-20 NOTE — Addendum Note (Signed)
Addended by: Wyline BeadyMCCRIMMON, Derel Mcglasson C on: 02/20/2015 03:45 PM   Modules accepted: Orders

## 2015-02-21 ENCOUNTER — Telehealth: Payer: Self-pay | Admitting: Family Medicine

## 2015-02-21 MED ORDER — PREDNISONE 20 MG PO TABS: ORAL_TABLET | ORAL | Status: AC

## 2015-02-21 NOTE — Telephone Encounter (Signed)
Yes of course, Rx sent.

## 2015-02-21 NOTE — Telephone Encounter (Signed)
Pt.notified

## 2015-02-21 NOTE — Telephone Encounter (Signed)
Patient called request to know if he can have prednisone called into his pharmacy for his hip pain to [facility] in [place]. Thanks

## 2015-02-25 ENCOUNTER — Encounter: Payer: Self-pay | Admitting: Sports Medicine

## 2015-02-25 ENCOUNTER — Ambulatory Visit (INDEPENDENT_AMBULATORY_CARE_PROVIDER_SITE_OTHER): Payer: PRIVATE HEALTH INSURANCE | Admitting: Sports Medicine

## 2015-02-25 ENCOUNTER — Ambulatory Visit (INDEPENDENT_AMBULATORY_CARE_PROVIDER_SITE_OTHER): Payer: PRIVATE HEALTH INSURANCE

## 2015-02-25 VITALS — BP 128/71 | HR 50 | Temp 97.5°F | Resp 18 | Ht 66.0 in | Wt 192.0 lb

## 2015-02-25 DIAGNOSIS — IMO0001 Reserved for inherently not codable concepts without codable children: Secondary | ICD-10-CM

## 2015-02-25 DIAGNOSIS — M791 Myalgia: Secondary | ICD-10-CM | POA: Diagnosis not present

## 2015-02-25 DIAGNOSIS — M5416 Radiculopathy, lumbar region: Secondary | ICD-10-CM

## 2015-02-25 DIAGNOSIS — M5135 Other intervertebral disc degeneration, thoracolumbar region: Secondary | ICD-10-CM

## 2015-02-25 DIAGNOSIS — M609 Myositis, unspecified: Secondary | ICD-10-CM | POA: Diagnosis not present

## 2015-02-25 MED ORDER — NAPROXEN-ESOMEPRAZOLE 500-20 MG PO TBEC: 1.0000 | DELAYED_RELEASE_TABLET | Freq: Two times a day (BID) | ORAL | Status: DC

## 2015-02-25 NOTE — Assessment & Plan Note (Signed)
Right sided L4 versus L5 radiculopathy, with right-sided sacroiliac joint dysfunction.  Formal physical therapy, meloxicam, sacral iliac joint injection on the right side under ultrasound guidance. Next line return to see me in one month, MRI for interventional if no better.

## 2015-02-25 NOTE — Progress Notes (Signed)
   Subjective:    I'm seeing this patient as a consultation for:  Dr. Ivan AnchorsHommel  CC:  Right sided low back pain  HPI: This is a pleasant 49 year old male with a several month history of pain that he localizes in the right side of his low back, moderate, persistent with occasional radiation down the back of the thigh, and occasionally to the dorsum and medial foot. His pain is most pronounced over the right sacroiliac joint. No bowel or bladder dysfunction, saddle numbness. He did have some hip x-rays done at a previous visit.  Past medical history, Surgical history, Family history not pertinant except as noted below, Social history, Allergies, and medications have been entered into the medical record, reviewed, and no changes needed.   Review of Systems: No headache, visual changes, nausea, vomiting, diarrhea, constipation, dizziness, abdominal pain, skin rash, fevers, chills, night sweats, weight loss, swollen lymph nodes, body aches, joint swelling, muscle aches, chest pain, shortness of breath, mood changes, visual or auditory hallucinations.   Objective:   General: Well Developed, well nourished, and in no acute distress.  Neuro/Psych: Alert and oriented x3, extra-ocular muscles intact, able to move all 4 extremities, sensation grossly intact. Skin: Warm and dry, no rashes noted.  Respiratory: Not using accessory muscles, speaking in full sentences, trachea midline.  Cardiovascular: Pulses palpable, no extremity edema. Abdomen: Does not appear distended. Bilateral Hips: ROM IR: 60 Deg, ER: 60 Deg, Flexion: 120 Deg, Extension: 100 Deg, Abduction: 45 Deg, Adduction: 45 Deg Strength IR: 5/5, ER: 5/5, Flexion: 5/5, Extension: 5/5, Abduction: 5/5, Adduction: 5/5 Pelvic alignment unremarkable to inspection and palpation. Standing hip rotation and gait without trendelenburg / unsteadiness. Greater trochanter without tenderness to palpation. No tenderness over piriformis. No SI joint  tenderness and normal minimal SI movement. Back Exam:  Inspection: Unremarkable  Motion: Flexion 45 deg, Extension 45 deg, Side Bending to 45 deg bilaterally,  Rotation to 45 deg bilaterally  SLR laying: Negative  XSLR laying: Negative  Palpable tenderness: Right sacroiliac joint. FABER: negative. Sensory change: Gross sensation intact to all lumbar and sacral dermatomes.  Reflexes: 2+ at both patellar tendons, 2+ at achilles tendons, Babinski's downgoing.  Strength at foot  Plantar-flexion: 5/5 Dorsi-flexion: 5/5 Eversion: 5/5 Inversion: 5/5  Leg strength  Quad: 5/5 Hamstring: 5/5 Hip flexor: 5/5 Hip abductors: 5/5  Gait unremarkable.  Procedure: Real-time Ultrasound Guided Injection of right sacroiliac joint Device: GE Logiq E  Verbal informed consent obtained.  Time-out conducted.  Noted no overlying erythema, induration, or other signs of local infection.  Skin prepped in a sterile fashion.  Local anesthesia: Topical Ethyl chloride.  With sterile technique and under real time ultrasound guidance:  Spinal needle advanced over the sacral premonitory taking care to avoid the S1 neural foramen, once the sacroiliac joint was entered I injected a total of 1 mL Kenalog 40, 4 mL lidocaine. Completed without difficulty  Pain immediately resolved suggesting accurate placement of the medication.  Advised to call if fevers/chills, erythema, induration, drainage, or persistent bleeding.  Images permanently stored and available for review in the ultrasound unit.  Impression: Technically successful ultrasound guided injection.  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  Impression and Recommendations:   This case required medical decision making of moderate complexity.

## 2015-03-04 ENCOUNTER — Ambulatory Visit: Payer: PRIVATE HEALTH INSURANCE | Admitting: Physical Therapy

## 2015-03-06 ENCOUNTER — Ambulatory Visit (INDEPENDENT_AMBULATORY_CARE_PROVIDER_SITE_OTHER): Payer: PRIVATE HEALTH INSURANCE | Admitting: Physical Therapy

## 2015-03-06 ENCOUNTER — Encounter: Payer: Self-pay | Admitting: Physical Therapy

## 2015-03-06 DIAGNOSIS — R262 Difficulty in walking, not elsewhere classified: Secondary | ICD-10-CM

## 2015-03-06 DIAGNOSIS — M5430 Sciatica, unspecified side: Secondary | ICD-10-CM

## 2015-03-06 DIAGNOSIS — M545 Low back pain: Secondary | ICD-10-CM

## 2015-03-06 DIAGNOSIS — R531 Weakness: Secondary | ICD-10-CM | POA: Diagnosis not present

## 2015-03-06 DIAGNOSIS — M5440 Lumbago with sciatica, unspecified side: Secondary | ICD-10-CM

## 2015-03-06 NOTE — Patient Instructions (Signed)
Knee-to-Chest Stretch: Unilateral  [place] [phone]    With hand behind right knee, pull knee in to chest until a comfortable stretch is felt in lower back and buttocks. Keep back relaxed. Hold _20___ seconds. Repeat __1__ times per set. Do _1___ sets per session. Do __2__ sessions per day.  http://orth.exer.us/126  Lumbar Rotation Stretch   Lie on back with left knee drawn toward chest. Slowly bring bent leg across body until stretch is felt in lower back/hip area. Hold __20__ seconds. Repeat __1__ times per set. Do __1__ sets per session. Do ____ sessions per day.  http://orth.exer.us/198   Lumbar Rotation (Non-Weight Bearing)   Feet on floor, slowly rock knees from side to side in small, pain-free range of motion. Allow lower back to rotate slightly. Repeat _10___ times per set. Do __1__ sets per session. Do __2__ sessions per day.  http://orth.exer.us/160   Copyright  VHI. All rights reserved.

## 2015-03-06 NOTE — Therapy (Addendum)
[place], [place], [address] Phone: [phone]   Fax:  [phone]  Physical Therapy Evaluation  Patient Details  Name: Damon Conrad MRN: [id number] Date of Birth: 01-24-1966 Referring Provider:  Silverio Decamp,*  Encounter Date: 03/06/2015      PT End of Session - 03/06/15 1411    Visit Number 1   Number of Visits 8   Date for PT Re-Evaluation 04/03/15   PT Start Time 0942   PT Stop Time 1034   PT Time Calculation (min) 52 min   Activity Tolerance Patient tolerated treatment well  liked the stim, discussed him ordering home TENS unit      Past Medical History  Diagnosis Date  . HTN (hypertension), benign   . GERD (gastroesophageal reflux disease)     Past Surgical History  Procedure Laterality Date  . Abdominal hernia repair Bilateral     x 2    There were no vitals filed for this visit.  Visit Diagnosis:  Weakness generalized - Plan: PT plan of care cert/re-cert  Back pain of lumbar region with sciatica - Plan: PT plan of care cert/re-cert  Difficulty walking - Plan: PT plan of care cert/re-cert      Subjective Assessment - 03/06/15 0949    Subjective Pt reports he developed Rt sided low back pain, insidious onset about 2 months ago. The pain hs been intermittent and this is the third epidsode of pain and it's not getting better.    Pertinent History Has been on prednisone, today is the last day and he had noticed a little improvement.    How long can you sit comfortably? no problem   How long can you stand comfortably? pain is very bad for about 25 minutes and then it eases off some.    How long can you walk comfortably? has some pain however better than standing still    Diagnostic tests x-rays showed degenerative changes.    Patient Stated Goals take the pain away   Currently in Pain? Yes   Pain Score 5   varies from 10/10 with initial standing then settles to a  5/10    Pain Orientation Right   Pain Type Acute pain   Pain Radiating Towards to the Rt calf   Pain Onset More than a month ago   Pain Frequency Intermittent   Aggravating Factors  standing and weight bearing    Pain Relieving Factors sitting and lying down take the pain away            [facility] PT Assessment - 03/06/15 0001    Assessment   Medical Diagnosis Rt lumbar radiculopathy   Onset Date 01/04/15   Next MD Visit 03/27/15   Precautions   Precautions None   Balance Screen   Has the patient fallen in the past 6 months No   Has the patient had a decrease in activity level because of a fear of falling?  No   Is the patient reluctant to leave their home because of a fear of falling?  No   Home Environment   Living Enviornment Private residence   Additional Comments able to get up/down stairs ok   Prior Function   Level of Independence --  I with all activity   Vocation Full time employment  texticle   Vocation Requirements walking, bending some, difficulty at work due to walking   Leisure no trouble   Observation/Other Assessments   Focus on  Therapeutic Outcomes (FOTO)  47% limited   Posture/Postural Control   Posture/Postural Control Postural limitations  Lt LE sligthly shorter than Rt    Postural Limitations Forward head;Rounded Shoulders  extra abdominal girth   ROM / Strength   AROM / PROM / Strength AROM;Strength   AROM   Overall AROM  --  lumbar WNL, some Rt hip pain with extension   Overall AROM Comments slight tightness in bilat hamstrings.    Strength   Overall Strength Deficits   Overall Strength Comments --  multifidis fair Lt, poor Rt.    Strength Assessment Site Ankle;Hip;Knee   Right/Left Hip Right;Left   Right Hip Flexion 5/5   Right Hip Extension 4-/5   Right Hip ABduction 4/5   Left Hip Flexion 5/5   Left Hip Extension 4+/5   Left Hip ABduction 5/5   Right/Left Knee --  Bilat WNL   Right/Left Ankle --  WNL except Rt DF 4/5   Palpation    Palpation very tight and tender in Lt quadratus lumborum and lumbar paraspinals.  Hypersensitive to mobs at L4-5 CPA and Rt UPA. Also has edema in low back L4 to sacrum.   Special Tests    Special Tests Lumbar  (+) slump Rt                            PT Education - 03/06/15 1014    Education provided Yes   Education Details HEP   Person(s) Educated Patient   Methods Explanation;Demonstration;Handout   Comprehension Returned demonstration             PT Long Term Goals - 03/06/15 1415    PT LONG TERM GOAL #1   Title I with advanced HEP   Time 4   Period Weeks   Status New   PT LONG TERM GOAL #2   Title report pain decrease =/> 75% with standing/walking   Time 4   Period Weeks   Status New   PT LONG TERM GOAL #3   Title increase strength core and bilat LEs =/> 5-/5   Time 4   Period Weeks   Status New   PT LONG TERM GOAL #4   Title improve FOTO =/< 31% limited, 4 weeks, new goal   PT LONG TERM GOAL #5   Title I with home TENS unit is patient purchases one   Time 4   Period Weeks   Status New               Plan - 03/06/15 1412    Clinical Impression Statement 49 yo male presents with h/o intermittent LBP with this most recent episode starting a couple months ago and not resolving on its own.  The pain radiates into his Rt calf with weight bearing.  He is finishing up a prednisone dose pack with minimal difference.  He does have a small leg length difference and this could be contributing to his pain.    Pt will benefit from skilled therapeutic intervention in order to improve on the following deficits Difficulty walking;Increased edema;Postural dysfunction;Pain;Decreased strength;Decreased mobility   Rehab Potential Good   PT Frequency 2x / week   PT Duration 4 weeks   PT Treatment/Interventions Moist Heat;Patient/family education;Therapeutic activities;Therapeutic exercise;Traction;Ultrasound;Manual techniques;Cryotherapy;Electrical  Stimulation   PT Next Visit Plan add in strengthening to HEP, STW/TPR to QL   Consulted and Agree with Plan of Care Patient  Problem List Patient Active Problem List   Diagnosis Date Noted  . Right lumbar radiculopathy 02/25/2015  . Hypertriglyceridemia 05/08/2014  . Essential hypertension, benign 05/07/2014  . GERD (gastroesophageal reflux disease) 05/07/2014  . Pain in the chest 05/07/2014    Jeral Pinch, PT 03/06/2015, 2:21 PM  [facility] [place], [place], [address] Phone: [phone]   Fax:  [phone]    PHYSICAL THERAPY DISCHARGE SUMMARY  Visits from Start of Care: 1  Current functional level related to goals / functional outcomes: Unknown d/c status - patient attended evaluation visit only. He verbalized concerns about cost of therapy at time of initial visit.   Remaining deficits: unknown   Education / Equipment: Provided HEP at initial visit Plan: Patient agrees to discharge.  Patient goals were not met. Patient is being discharged due to not returning since the last visit.  ?????   Celyn P. Helene Kelp, PT, MPH 04/23/15 11:40 a

## 2015-03-07 ENCOUNTER — Other Ambulatory Visit: Payer: Self-pay | Admitting: Family Medicine

## 2015-03-13 ENCOUNTER — Encounter: Payer: PRIVATE HEALTH INSURANCE | Admitting: Physical Therapy

## 2015-03-19 ENCOUNTER — Encounter: Payer: PRIVATE HEALTH INSURANCE | Admitting: Physical Therapy

## 2015-03-25 ENCOUNTER — Ambulatory Visit: Payer: PRIVATE HEALTH INSURANCE | Admitting: Sports Medicine

## 2015-04-02 ENCOUNTER — Other Ambulatory Visit: Payer: Self-pay | Admitting: Family Medicine

## 2015-04-02 ENCOUNTER — Ambulatory Visit: Payer: PRIVATE HEALTH INSURANCE | Admitting: Sports Medicine

## 2015-04-03 NOTE — Telephone Encounter (Signed)
Andrea, Rx placed in in-box ready for pickup/faxing.  

## 2015-07-04 ENCOUNTER — Other Ambulatory Visit: Payer: Self-pay | Admitting: Family Medicine

## 2015-08-17 ENCOUNTER — Other Ambulatory Visit: Payer: Self-pay | Admitting: Sports Medicine

## 2015-10-09 ENCOUNTER — Other Ambulatory Visit: Payer: Self-pay

## 2015-10-09 MED ORDER — METOPROLOL SUCCINATE ER 50 MG PO TB24: 50.0000 mg | ORAL_TABLET | Freq: Every day | ORAL | Status: DC

## 2015-10-10 ENCOUNTER — Other Ambulatory Visit: Payer: Self-pay | Admitting: Family Medicine

## 2015-10-24 ENCOUNTER — Encounter: Payer: Self-pay | Admitting: Family Medicine

## 2015-10-24 ENCOUNTER — Ambulatory Visit (INDEPENDENT_AMBULATORY_CARE_PROVIDER_SITE_OTHER): Payer: Commercial Managed Care - PPO | Admitting: Family Medicine

## 2015-10-24 VITALS — BP 147/96 | HR 67 | Wt 198.0 lb

## 2015-10-24 DIAGNOSIS — I10 Essential (primary) hypertension: Secondary | ICD-10-CM

## 2015-10-24 DIAGNOSIS — R079 Chest pain, unspecified: Secondary | ICD-10-CM | POA: Diagnosis not present

## 2015-10-24 MED ORDER — METOPROLOL SUCCINATE ER 100 MG PO TB24: ORAL_TABLET | ORAL | Status: DC

## 2015-10-24 NOTE — Progress Notes (Signed)
CC: Damon Conrad is a 49 y.o. male is here for Chest Pain and Medication Refill   Subjective: HPI:  Follow-up essential hypertension: Checking blood pressure at home is usually no stage I or stage II range. He tells me the chest pains have become less frequent ever since we increased metoprolol earlier this year. Denies orthopnea peripheral edema nor shortness of breath. Denies any racing heartbeat or irregular heartbeat. No formal exercise routine.  Follow-up chest pain: Since I saw him last it's been less frequent but occurring about once a month. He describes it as a stabbing sensation in the left side of the chest that's going from front to back. It can happen at rest or when walking. Usually lasts about an hour. He is able to reproduce it by pressing on the left chest wall. Its mild in severity. It never has any other accompanied symptoms. Denies cough, wheezing, shortness of breath or overlying skin changes   Review Of Systems Outlined In HPI  Past Medical History  Diagnosis Date  . HTN (hypertension), benign   . GERD (gastroesophageal reflux disease)     Past Surgical History  Procedure Laterality Date  . Abdominal hernia repair Bilateral     x 2   Family History  Problem Relation Age of Onset  . Alcohol abuse Father   . Cancer Mother   . Hypertension Mother     Social History   Social History  . Marital Status: Single    Spouse Name: N/A  . Number of Children: N/A  . Years of Education: N/A   Occupational History  . Not on file.   Social History Main Topics  . Smoking status: Never Smoker   . Smokeless tobacco: Not on file  . Alcohol Use: Yes     Comment: Rarely  . Drug Use: No  . Sexual Activity:    Partners: Female   Other Topics Concern  . Not on file   Social History Narrative     Objective: BP 147/96 mmHg  Pulse 67  Wt 198 lb (89.812 kg)  General: Alert and Oriented, No Acute Distress HEENT: Pupils equal, round, reactive to light. Conjunctivae  clear.  Moist mucous membranes pharynx unremarkable Lungs: Clear to auscultation bilaterally, no wheezing/ronchi/rales.  Comfortable work of breathing. Good air movement. Cardiac: Regular rate and rhythm. Normal S1/S2.  No murmurs, rubs, nor gallops.  Unable to reproduce pain pressing on the left breast and chest wall. Extremities: No peripheral edema.  Strong peripheral pulses.  Mental Status: No depression, anxiety, nor agitation. Skin: Warm and dry.  Assessment & Plan: Damon Conrad was seen today for chest pain and medication refill.  Diagnoses and all orders for this visit:  Essential hypertension, benign -     metoprolol succinate (TOPROL-XL) 100 MG 24 hr tablet; TAKE ONE TABLET BY MOUTH ONCE DAILY  Chest pain, unspecified chest pain type   Essential hypertension: Uncontrolled chronic condition increasing metoprolol Chest pain: Improving, hopefully this will continue to improve until his blood pressure is normal. I've asked him to call me for a nurse visit if he ever is experiencing the pain so we get an EKG while he is having the pain, tried to explain to him that getting an EKG that he requested today would be of low yield given that he is not having pain.  Return if symptoms worsen or fail to improve.

## 2015-11-05 ENCOUNTER — Telehealth: Payer: Self-pay

## 2015-11-05 MED ORDER — AMOXICILLIN-POT CLAVULANATE 500-125 MG PO TABS: ORAL_TABLET | ORAL | Status: AC

## 2015-11-05 NOTE — Telephone Encounter (Signed)
Marylene LandAngela, We did talk about this at his visit but I didn't document it, I'll send in a Rx for Augmentin to his [facility] in [place].

## 2015-11-05 NOTE — Telephone Encounter (Signed)
Left message advising of recommendations.  

## 2015-11-05 NOTE — Telephone Encounter (Signed)
Coughing, drainage and runny nose for 7 days. He states he talked to Dr Ivan AnchorsHommel about his runny nose and was advised to take zyrtec. He wants an antibiotic. I advised him he will need to come in for an office visit. He was not happy about this. He stated he was seen already for this problem. Please advise.

## 2016-01-30 ENCOUNTER — Encounter: Payer: Self-pay | Admitting: Family Medicine

## 2016-01-30 ENCOUNTER — Ambulatory Visit (INDEPENDENT_AMBULATORY_CARE_PROVIDER_SITE_OTHER): Payer: Commercial Managed Care - PPO | Admitting: Family Medicine

## 2016-01-30 VITALS — BP 108/71 | HR 84 | Temp 99.7°F | Wt 198.0 lb

## 2016-01-30 DIAGNOSIS — R509 Fever, unspecified: Secondary | ICD-10-CM

## 2016-01-30 LAB — POC INFLUENZA A&B (BINAX/QUICKVUE)
Influenza A, POC: NEGATIVE
Influenza B, POC: NEGATIVE

## 2016-01-30 MED ORDER — AZITHROMYCIN 250 MG PO TABS: ORAL_TABLET | ORAL | Status: AC

## 2016-01-30 MED ORDER — HYDROCODONE-HOMATROPINE 5-1.5 MG/5ML PO SYRP: 5.0000 mL | ORAL_SOLUTION | Freq: Three times a day (TID) | ORAL | Status: DC | PRN

## 2016-01-30 NOTE — Addendum Note (Signed)
Addended by: Thom ChimesHENRY, Mickelle Goupil M on: 01/30/2016 01:04 PM   Modules accepted: Orders, SmartSet

## 2016-01-30 NOTE — Progress Notes (Signed)
CC: Damon Conrad is a 50 y.o. male is here for URI   Subjective: HPI:  Ever since Monday of this week he's been experiencing fevers and chills with a maximum temperature 101.0. He's also had a nonproductive cough and myalgias in the legs. No benefit from Tylenol no other interventions as of yet. He denies shortness of breath or wheezing. He's had some postnasal drip but denies any nasal congestion or sore throat. No rash photophobia or confusion. Symptoms are persistent and interfering with sleep.   Review Of Systems Outlined In HPI  Past Medical History  Diagnosis Date  . HTN (hypertension), benign   . GERD (gastroesophageal reflux disease)     Past Surgical History  Procedure Laterality Date  . Abdominal hernia repair Bilateral     x 2   Family History  Problem Relation Age of Onset  . Alcohol abuse Father   . Cancer Mother   . Hypertension Mother     Social History   Social History  . Marital Status: Single    Spouse Name: N/A  . Number of Children: N/A  . Years of Education: N/A   Occupational History  . Not on file.   Social History Main Topics  . Smoking status: Never Smoker   . Smokeless tobacco: Not on file  . Alcohol Use: Yes     Comment: Rarely  . Drug Use: No  . Sexual Activity:    Partners: Female   Other Topics Concern  . Not on file   Social History Narrative     Objective: BP 108/71 mmHg  Pulse 84  Temp(Src) 99.7 F (37.6 C) (Oral)  Wt 198 lb (89.812 kg)  SpO2 93%  General: Alert and Oriented, No Acute Distress HEENT: Pupils equal, round, reactive to light. Conjunctivae clear.  External ears unremarkable, canals clear with intact TMs with appropriate landmarks.  Middle ear appears open without effusion. Pink inferior turbinates.  Moist mucous membranes, pharynx without inflammation nor lesions.  Neck supple without palpable lymphadenopathy nor abnormal masses. Lungs: Clear to auscultation bilaterally, no wheezing/ronchi/rales.  Comfortable  work of breathing. Good air movement. Cardiac: Regular rate and rhythm. Normal S1/S2.  No murmurs, rubs, nor gallops.   Extremities: No peripheral edema.  Strong peripheral pulses.  Mental Status: No depression, anxiety, nor agitation. Skin: Warm and dry.  Assessment & Plan: [name] was seen today for uri.  Diagnoses and all orders for this visit:  Fever chills -     HYDROcodone-homatropine (HYCODAN) 5-1.5 MG/5ML syrup; Take 5 mLs by mouth every 8 (eight) hours as needed for cough. -     azithromycin (ZITHROMAX) 250 MG tablet; Take two tabs at once on day 1, then one tab daily on days 2-5.   Rapid flu is negative therefore start azithromycin for suspected bacterial sinusitis and Hycodan to help with sleep.  Return if symptoms worsen or fail to improve. .Marland Kitchen

## 2016-02-03 ENCOUNTER — Telehealth: Payer: Self-pay

## 2016-02-03 MED ORDER — PREDNISONE 20 MG PO TABS: ORAL_TABLET | ORAL | Status: AC

## 2016-02-03 NOTE — Telephone Encounter (Signed)
Pt was seen last Thursday and is still having the same Sx.  He wants to know is there something else he could possibly take? Please advise.

## 2016-02-03 NOTE — Telephone Encounter (Signed)
5 day prednisone regimen sent to [facility] on [address]

## 2016-02-03 NOTE — Telephone Encounter (Signed)
Pt.notified

## 2016-02-19 ENCOUNTER — Ambulatory Visit (INDEPENDENT_AMBULATORY_CARE_PROVIDER_SITE_OTHER): Payer: Commercial Managed Care - PPO

## 2016-02-19 ENCOUNTER — Ambulatory Visit (INDEPENDENT_AMBULATORY_CARE_PROVIDER_SITE_OTHER): Payer: Commercial Managed Care - PPO | Admitting: Family Medicine

## 2016-02-19 ENCOUNTER — Encounter: Payer: Self-pay | Admitting: Family Medicine

## 2016-02-19 VITALS — BP 144/96 | HR 67 | Wt 193.0 lb

## 2016-02-19 DIAGNOSIS — M7732 Calcaneal spur, left foot: Secondary | ICD-10-CM | POA: Diagnosis not present

## 2016-02-19 DIAGNOSIS — M1000 Idiopathic gout, unspecified site: Secondary | ICD-10-CM

## 2016-02-19 DIAGNOSIS — M109 Gout, unspecified: Secondary | ICD-10-CM | POA: Insufficient documentation

## 2016-02-19 LAB — CBC
HEMATOCRIT: 41.5 % (ref 38.5–50.0)
Hemoglobin: 13.8 g/dL (ref 13.2–17.1)
MCH: 29.4 pg (ref 27.0–33.0)
MCHC: 33.3 g/dL (ref 32.0–36.0)
MCV: 88.5 fL (ref 80.0–100.0)
MPV: 10.7 fL (ref 7.5–12.5)
Platelets: 209 10*3/uL (ref 140–400)
RBC: 4.69 MIL/uL (ref 4.20–5.80)
RDW: 13.8 % (ref 11.0–15.0)
WBC: 13.8 10*3/uL — ABNORMAL HIGH (ref 3.8–10.8)

## 2016-02-19 LAB — COMPREHENSIVE METABOLIC PANEL
ALBUMIN: 3.7 g/dL (ref 3.6–5.1)
ALK PHOS: 80 U/L (ref 40–115)
ALT: 20 U/L (ref 9–46)
AST: 16 U/L (ref 10–35)
BUN: 17 mg/dL (ref 7–25)
CALCIUM: 8.9 mg/dL (ref 8.6–10.3)
CO2: 24 mmol/L (ref 20–31)
Chloride: 103 mmol/L (ref 98–110)
Creat: 1.18 mg/dL (ref 0.70–1.33)
Glucose, Bld: 115 mg/dL — ABNORMAL HIGH (ref 65–99)
POTASSIUM: 4.6 mmol/L (ref 3.5–5.3)
Sodium: 136 mmol/L (ref 135–146)
TOTAL PROTEIN: 6.9 g/dL (ref 6.1–8.1)
Total Bilirubin: 0.8 mg/dL (ref 0.2–1.2)

## 2016-02-19 LAB — URIC ACID: Uric Acid, Serum: 9 mg/dL — ABNORMAL HIGH (ref 4.0–7.8)

## 2016-02-19 MED ORDER — HYDROCODONE-ACETAMINOPHEN 5-325 MG PO TABS: 1.0000 | ORAL_TABLET | Freq: Four times a day (QID) | ORAL | Status: DC | PRN

## 2016-02-19 MED ORDER — COLCHICINE 0.6 MG PO CAPS: 1.0000 | ORAL_CAPSULE | Freq: Every day | ORAL | Status: DC

## 2016-02-19 MED ORDER — COLCHICINE 0.6 MG PO TABS: 0.6000 mg | ORAL_TABLET | Freq: Every day | ORAL | Status: DC

## 2016-02-19 NOTE — Patient Instructions (Signed)
Thank you for coming in today. I think this is probably gout. Get labs drawn today. Go to the pharmacy and fill either the colchicine tablets or capsules based on best price. Take Norco for pain as needed. Do not drive or operate heavy machinery after taking Norco. Return in a few weeks for recheck and reevaluation. Return sooner if needed.  Gout Gout is an inflammatory arthritis caused by a buildup of uric acid crystals in the joints. Uric acid is a chemical that is normally present in the blood. When the level of uric acid in the blood is too high it can form crystals that deposit in your joints and tissues. This causes joint redness, soreness, and swelling (inflammation). Repeat attacks are common. Over time, uric acid crystals can form into masses (tophi) near a joint, destroying bone and causing disfigurement. Gout is treatable and often preventable. CAUSES  The disease begins with elevated levels of uric acid in the blood. Uric acid is produced by your body when it breaks down a naturally found substance called purines. Certain foods you eat, such as meats and fish, contain high amounts of purines. Causes of an elevated uric acid level include:  Being passed down from parent to child (heredity).  Diseases that cause increased uric acid production (such as obesity, psoriasis, and certain cancers).  Excessive alcohol use.  Diet, especially diets rich in meat and seafood.  Medicines, including certain cancer-fighting medicines (chemotherapy), water pills (diuretics), and aspirin.  Chronic kidney disease. The kidneys are no longer able to remove uric acid well.  Problems with metabolism. Conditions strongly associated with gout include:  Obesity.  High blood pressure.  High cholesterol.  Diabetes. Not everyone with elevated uric acid levels gets gout. It is not understood why some people get gout and others do not. Surgery, joint injury, and eating too much of certain foods are  some of the factors that can lead to gout attacks. SYMPTOMS   An attack of gout comes on quickly. It causes intense pain with redness, swelling, and warmth in a joint.  Fever can occur.  Often, only one joint is involved. Certain joints are more commonly involved:  Base of the big toe.  Knee.  Ankle.  Wrist.  Finger. Without treatment, an attack usually goes away in a few days to weeks. Between attacks, you usually will not have symptoms, which is different from many other forms of arthritis. DIAGNOSIS  Your caregiver will suspect gout based on your symptoms and exam. In some cases, tests may be recommended. The tests may include:  Blood tests.  Urine tests.  X-rays.  Joint fluid exam. This exam requires a needle to remove fluid from the joint (arthrocentesis). Using a microscope, gout is confirmed when uric acid crystals are seen in the joint fluid. TREATMENT  There are two phases to gout treatment: treating the sudden onset (acute) attack and preventing attacks (prophylaxis).  Treatment of an Acute Attack.  Medicines are used. These include anti-inflammatory medicines or steroid medicines.  An injection of steroid medicine into the affected joint is sometimes necessary.  The painful joint is rested. Movement can worsen the arthritis.  You may use warm or cold treatments on painful joints, depending which works best for you.  Treatment to Prevent Attacks.  If you suffer from frequent gout attacks, your caregiver may advise preventive medicine. These medicines are started after the acute attack subsides. These medicines either help your kidneys eliminate uric acid from your body or decrease your uric  acid production. You may need to stay on these medicines for a very long time.  The early phase of treatment with preventive medicine can be associated with an increase in acute gout attacks. For this reason, during the first few months of treatment, your caregiver may also  advise you to take medicines usually used for acute gout treatment. Be sure you understand your caregiver's directions. Your caregiver may make several adjustments to your medicine dose before these medicines are effective.  Discuss dietary treatment with your caregiver or dietitian. Alcohol and drinks high in sugar and fructose and foods such as meat, poultry, and seafood can increase uric acid levels. Your caregiver or dietitian can advise you on drinks and foods that should be limited. HOME CARE INSTRUCTIONS   Do not take aspirin to relieve pain. This raises uric acid levels.  Only take over-the-counter or prescription medicines for pain, discomfort, or fever as directed by your caregiver.  Rest the joint as much as possible. When in bed, keep sheets and blankets off painful areas.  Keep the affected joint raised (elevated).  Apply warm or cold treatments to painful joints. Use of warm or cold treatments depends on which works best for you.  Use crutches if the painful joint is in your leg.  Drink enough fluids to keep your urine clear or pale yellow. This helps your body get rid of uric acid. Limit alcohol, sugary drinks, and fructose drinks.  Follow your dietary instructions. Pay careful attention to the amount of protein you eat. Your daily diet should emphasize fruits, vegetables, whole grains, and fat-free or low-fat milk products. Discuss the use of coffee, vitamin C, and cherries with your caregiver or dietitian. These may be helpful in lowering uric acid levels.  Maintain a healthy body weight. SEEK MEDICAL CARE IF:   You develop diarrhea, vomiting, or any side effects from medicines.  You do not feel better in 24 hours, or you are getting worse. SEEK IMMEDIATE MEDICAL CARE IF:   Your joint becomes suddenly more tender, and you have chills or a fever. MAKE SURE YOU:   Understand these instructions.  Will watch your condition.  Will get help right away if you are not  doing well or get worse.   This information is not intended to replace advice given to you by your health care provider. Make sure you discuss any questions you have with your health care provider.   Document Released: 10/16/2000 Document Revised: 11/09/2014 Document Reviewed: 06/01/2012 Elsevier Interactive Patient Education [employer]

## 2016-02-19 NOTE — Progress Notes (Signed)
   Subjective:    I'm seeing this patient as a consultation for:  Dr. Ivan AnchorsHommel  CC: Left great toe pain  HPI: Patient has a 1-1/2 month history of intermittent pain and swelling of the left great toe. Symptoms worsened dramatically this morning at about 4 AM. The pain woke him from sleep. He notes pain swelling and redness. He has difficulty walking due to the pain. He has tried  voltaren which did not work well. He denies any fevers chills nausea vomiting or diarrhea. He denies any specific injury that might explain his pain. He denies a personal history of gout but notes a positive family history for gout.  Past medical history, Surgical history, Family history not pertinant except as noted below, Social history, Allergies, and medications have been entered into the medical record, reviewed, and no changes needed.   Review of Systems: No headache, visual changes, nausea, vomiting, diarrhea, constipation, dizziness, abdominal pain, skin rash, fevers, chills, night sweats, weight loss, swollen lymph nodes, body aches, j muscle aches, chest pain, shortness of breath, mood changes, visual or auditory hallucinations.   Objective:    Filed Vitals:   02/19/16 0838  BP: 144/96  Pulse: 67   General: Well Developed, well nourished, and in no acute distress.  Neuro/Psych: Alert and oriented x3, extra-ocular muscles intact, able to move all 4 extremities, sensation grossly intact. Skin: Warm and dry, no rashes noted.  Respiratory: Not using accessory muscles, speaking in full sentences, trachea midline.  Cardiovascular: Pulses palpable, no extremity edema. Abdomen: Does not appear distended. MSK: Left foot with swelling and erythema at the great toe MTP. Tender to palpation at the MTP. Foot is nontender otherwise. Normal pulses capillary refill and sensation. Normal ankle motion. Painful gait.    No results found for this or any previous visit (from the past 24 hour(s)). Dg Foot Complete  Left  02/19/2016  CLINICAL DATA:  Pain with swelling, primarily first digit region EXAM: LEFT FOOT - COMPLETE 3+ VIEW COMPARISON:  October 24, 2014 FINDINGS: Frontal, oblique, and lateral views were obtained. There is no fracture or dislocation. There is no appreciable joint space narrowing. There is no erosive change or periostitis. There is soft tissue swelling in the region of the first MTP joint. There are small posterior and inferior calcaneal spurs. IMPRESSION: Soft tissue swelling in the region the first MTP joint without joint space narrowing or erosion appreciable radiographically. There are small calcaneal spurs. No fracture or dislocation. The joint spaces appear normal. Note that soft tissue swelling may be the earliest radiographically evident changes of a variety of arthropathies. Electronically Signed   By: Bretta BangWilliam  Woodruff III M.D.   On: 02/19/2016 09:02    Impression and Recommendations:   50 year old male with left foot first MTP pain. Likely due to gout. Plan to obtain CBC CMP and uric acid levels. Empiric treatment with colchicine tablets or capsules (patient will pick one based on price). Additionally will use a postoperative shoe and Norco. Return in about 4 weeks.  Follow-up with PCP.   This case required medical decision making of moderate complexity.

## 2016-02-20 NOTE — Progress Notes (Signed)
Quick Note:  Xray shows the soft tissue swelling but no broken bones ______

## 2016-02-21 ENCOUNTER — Encounter: Payer: Self-pay | Admitting: Family Medicine

## 2016-02-21 DIAGNOSIS — M1A9XX0 Chronic gout, unspecified, without tophus (tophi): Secondary | ICD-10-CM | POA: Insufficient documentation

## 2016-02-21 NOTE — Progress Notes (Signed)
Quick Note:  Uric acid is very elevated. You almost certainly have gout. Please follow up with Dr. Ivan AnchorsHommel to discuss options for managing gout going forward. ______

## 2016-02-24 ENCOUNTER — Encounter: Payer: Self-pay | Admitting: Family Medicine

## 2016-02-24 ENCOUNTER — Ambulatory Visit (INDEPENDENT_AMBULATORY_CARE_PROVIDER_SITE_OTHER): Payer: Commercial Managed Care - PPO

## 2016-02-24 ENCOUNTER — Ambulatory Visit (INDEPENDENT_AMBULATORY_CARE_PROVIDER_SITE_OTHER): Payer: Commercial Managed Care - PPO | Admitting: Family Medicine

## 2016-02-24 VITALS — BP 129/92 | HR 84 | Wt 192.0 lb

## 2016-02-24 DIAGNOSIS — J9811 Atelectasis: Secondary | ICD-10-CM

## 2016-02-24 DIAGNOSIS — J189 Pneumonia, unspecified organism: Secondary | ICD-10-CM

## 2016-02-24 DIAGNOSIS — R05 Cough: Secondary | ICD-10-CM | POA: Diagnosis not present

## 2016-02-24 DIAGNOSIS — J209 Acute bronchitis, unspecified: Secondary | ICD-10-CM

## 2016-02-24 DIAGNOSIS — R059 Cough, unspecified: Secondary | ICD-10-CM

## 2016-02-24 MED ORDER — ALBUTEROL SULFATE 108 (90 BASE) MCG/ACT IN AEPB: 1.0000 | INHALATION_SPRAY | RESPIRATORY_TRACT | Status: DC | PRN

## 2016-02-24 MED ORDER — IPRATROPIUM-ALBUTEROL 0.5-2.5 (3) MG/3ML IN SOLN
3.0000 mL | Freq: Once | RESPIRATORY_TRACT | Status: AC
  Administered 2016-02-24: 3 mL via RESPIRATORY_TRACT

## 2016-02-24 MED ORDER — CEFDINIR 300 MG PO CAPS: 300.0000 mg | ORAL_CAPSULE | Freq: Two times a day (BID) | ORAL | Status: DC

## 2016-02-24 MED ORDER — PREDNISONE 10 MG (48) PO TBPK: ORAL_TABLET | Freq: Every day | ORAL | Status: DC

## 2016-02-24 NOTE — Patient Instructions (Addendum)
Thank you for coming in today. Take prednisone for 12 days.  Use the inhaler as needed.  Call or go to the emergency room if you get worse, have trouble breathing, have chest pains, or palpitations.  Take omnicef antibiotics.  Return in 1 week if not better.    Acute Bronchitis Bronchitis is inflammation of the airways that extend from the windpipe into the lungs (bronchi). The inflammation often causes mucus to develop. This leads to a cough, which is the most common symptom of bronchitis.  In acute bronchitis, the condition usually develops suddenly and goes away over time, usually in a couple weeks. Smoking, allergies, and asthma can make bronchitis worse. Repeated episodes of bronchitis may cause further lung problems.  CAUSES Acute bronchitis is most often caused by the same virus that causes a cold. The virus can spread from person to person (contagious) through coughing, sneezing, and touching contaminated objects. SIGNS AND SYMPTOMS   Cough.   Fever.   Coughing up mucus.   Body aches.   Chest congestion.   Chills.   Shortness of breath.   Sore throat.  DIAGNOSIS  Acute bronchitis is usually diagnosed through a physical exam. Your health care provider will also ask you questions about your medical history. Tests, such as chest X-rays, are sometimes done to rule out other conditions.  TREATMENT  Acute bronchitis usually goes away in a couple weeks. Oftentimes, no medical treatment is necessary. Medicines are sometimes given for relief of fever or cough. Antibiotic medicines are usually not needed but may be prescribed in certain situations. In some cases, an inhaler may be recommended to help reduce shortness of breath and control the cough. A cool mist vaporizer may also be used to help thin bronchial secretions and make it easier to clear the chest.  HOME CARE INSTRUCTIONS  Get plenty of rest.   Drink enough fluids to keep your urine clear or pale yellow (unless  you have a medical condition that requires fluid restriction). Increasing fluids may help thin your respiratory secretions (sputum) and reduce chest congestion, and it will prevent dehydration.   Take medicines only as directed by your health care provider.  If you were prescribed an antibiotic medicine, finish it all even if you start to feel better.  Avoid smoking and secondhand smoke. Exposure to cigarette smoke or irritating chemicals will make bronchitis worse. If you are a smoker, consider using nicotine gum or skin patches to help control withdrawal symptoms. Quitting smoking will help your lungs heal faster.   Reduce the chances of another bout of acute bronchitis by washing your hands frequently, avoiding people with cold symptoms, and trying not to touch your hands to your mouth, nose, or eyes.   Keep all follow-up visits as directed by your health care provider.  SEEK MEDICAL CARE IF: Your symptoms do not improve after 1 week of treatment.  SEEK IMMEDIATE MEDICAL CARE IF:  You develop an increased fever or chills.   You have chest pain.   You have severe shortness of breath.  You have bloody sputum.   You develop dehydration.  You faint or repeatedly feel like you are going to pass out.  You develop repeated vomiting.  You develop a severe headache. MAKE SURE YOU:   Understand these instructions.  Will watch your condition.  Will get help right away if you are not doing well or get worse.   This information is not intended to replace advice given to you by your health  care provider. Make sure you discuss any questions you have with your health care provider.   Document Released: 11/26/2004 Document Revised: 11/09/2014 Document Reviewed: 04/11/2013 Elsevier Interactive Patient Education [employer].

## 2016-02-24 NOTE — Progress Notes (Signed)
Damon Conrad is a 50 y.o. male [facility] today for discuss cough. Patient was recently seen for foot pain thought to be due to gout. He was given colchicine and notes significant improvement in symptoms. However he has a persistent cough. He was seen about 4 weeks ago for bronchitis and treated with azithromycin and prednisone. He has the cough is worsening. He also notes body aches. No current fevers or chills or trouble breathing.   Past Medical History  Diagnosis Date  . HTN (hypertension), benign   . GERD (gastroesophageal reflux disease)    Past Surgical History  Procedure Laterality Date  . Abdominal hernia repair Bilateral     x 2   Social History  Substance Use Topics  . Smoking status: Never Smoker   . Smokeless tobacco: Not on file  . Alcohol Use: Yes     Comment: Rarely   family history includes Alcohol abuse in his father; Cancer in his mother; Hypertension in his mother.  ROS as above Medications: Current Outpatient Prescriptions  Medication Sig Dispense Refill  . aspirin 325 MG tablet Take 325 mg by mouth daily.    . colchicine 0.6 MG tablet Take 1 tablet (0.6 mg total) by mouth daily. 30 tablet 0  . diclofenac (VOLTAREN) 50 MG EC tablet Take one tablet every 8 hours only as needed for pain, take with small snack. 45 tablet 2  . esomeprazole (NEXIUM) 20 MG capsule Take 1 capsule (20 mg total) by mouth daily. 30 capsule 3  . HYDROcodone-acetaminophen (NORCO/VICODIN) 5-325 MG tablet Take 1 tablet by mouth every 6 (six) hours as needed for moderate pain. 30 tablet 0  . metoprolol succinate (TOPROL-XL) 100 MG 24 hr tablet TAKE ONE TABLET BY MOUTH ONCE DAILY 90 tablet 1  . Albuterol Sulfate (PROAIR RESPICLICK) 108 (90 Base) MCG/ACT AEPB Inhale 1-2 puffs into the lungs every 4 (four) hours as needed. 1 each 1  . cefdinir (OMNICEF) 300 MG capsule Take  1 capsule (300 mg total) by mouth 2 (two) times daily. 14 capsule 0  . predniSONE (STERAPRED UNI-PAK 48 TAB) 10 MG (48) TBPK tablet Take by mouth daily. 12 day dosepack po 48 tablet 0   No current facility-administered medications for this visit.   Allergies  Allergen Reactions  . Meloxicam     rash     Exam:  BP 129/92 mmHg  Pulse 84  Wt 192 lb (87.091 kg) Gen: Well NAD HEENT: EOMI,  MMM Lungs: Normal work of breathing. Coarse breath sounds bilaterally. Heart: RRR no MRG Abd: NABS, Soft. Nondistended, Nontender Exts: Brisk capillary refill, warm and well perfused.   Patient was given a 2.5/0.5 mg DuoNeb nebulizer treatment and Felt a little better  Chest x-ray  No results found for this or any previous visit (from the past 24 hour(s)). Dg Chest 2 View  02/24/2016  CLINICAL DATA:  Right lateral rib pain. Coughing over the last 2 weeks. EXAM: CHEST  2 VIEW COMPARISON:  01/03/2015 FINDINGS: Heart size is normal. Mediastinal shadows are normal. There is patchy left lower lobe pneumonia/ atelectasis. Chronic calcified granuloma in the left mid lung. The remainder of the lungs are clear. No effusions. No bone abnormality. IMPRESSION: Patchy left lower lobe pneumonia/ atelectasis. No bone abnormality. Electronically Signed   By: Paulina FusiMark  Shogry M.D.   On: 02/24/2016 [id number]      50 year old male: 50 year old male with bronchitis. X-ray results not available at  the time of discharge. He was prescribed prednisone and empiric Omnicef. Additionally he was prescribed albuterol. I feel this is a reasonable regimen given his diagnosis of bronchitis/pneumonia. We will call patient with x-ray results and follow-up next week.

## 2016-02-25 ENCOUNTER — Ambulatory Visit: Payer: Commercial Managed Care - PPO | Admitting: Family Medicine

## 2016-02-25 MED ORDER — AZITHROMYCIN 250 MG PO TABS: 250.0000 mg | ORAL_TABLET | Freq: Every day | ORAL | Status: DC

## 2016-02-25 NOTE — Addendum Note (Signed)
Addended by: Rodolph BongOREY, EVAN S on: 02/25/2016 06:32 AM   Modules accepted: Orders

## 2016-02-25 NOTE — Progress Notes (Signed)
Quick Note:  Xray show a pneumonia verses condition called Atelectasis. You should continue the current treatment. I will also call in a second antibiotic to make sure we are treating the pneumonia well. It is normal to have a cough after the pneumonia for a few weeks. ______

## 2016-03-02 ENCOUNTER — Ambulatory Visit (INDEPENDENT_AMBULATORY_CARE_PROVIDER_SITE_OTHER): Payer: Commercial Managed Care - PPO | Admitting: Family Medicine

## 2016-03-02 ENCOUNTER — Encounter: Payer: Self-pay | Admitting: Family Medicine

## 2016-03-02 VITALS — BP 135/88 | HR 59 | Wt 190.0 lb

## 2016-03-02 DIAGNOSIS — J189 Pneumonia, unspecified organism: Secondary | ICD-10-CM | POA: Diagnosis not present

## 2016-03-02 MED ORDER — HYDROCODONE-HOMATROPINE 5-1.5 MG/5ML PO SYRP: 5.0000 mL | ORAL_SOLUTION | Freq: Three times a day (TID) | ORAL | Status: DC | PRN

## 2016-03-02 MED ORDER — BUDESONIDE-FORMOTEROL FUMARATE 80-4.5 MCG/ACT IN AERO: 2.0000 | INHALATION_SPRAY | Freq: Two times a day (BID) | RESPIRATORY_TRACT | Status: DC

## 2016-03-02 NOTE — Progress Notes (Signed)
CC: Damon Conrad is a 50 y.o. male is here for Pneumonia   Subjective: HPI:  Last week was diagnosed with left lobar pneumonia. His not complain of any left sided chest pain but he does have some right-sided chest pain when actively coughing. He tells me does not hurt to breathe. He tells me that he was feeling feverish last week but that has improved. Additionally he feels like he is getting better with respect to shortness of breath but still has mild shortness of breath with activity. He tells me the cough and that discomfort in his side of the main things that bother him. His wife tells him that he wheezes while he is sleeping. He denies any production of sputum.   Review Of Systems Outlined In HPI  Past Medical History  Diagnosis Date  . HTN (hypertension), benign   . GERD (gastroesophageal reflux disease)     Past Surgical History  Procedure Laterality Date  . Abdominal hernia repair Bilateral     x 2   Family History  Problem Relation Age of Onset  . Alcohol abuse Father   . Cancer Mother   . Hypertension Mother     Social History   Social History  . Marital Status: Single    Spouse Name: N/A  . Number of Children: N/A  . Years of Education: N/A   Occupational History  . Not on file.   Social History Main Topics  . Smoking status: Never Smoker   . Smokeless tobacco: Not on file  . Alcohol Use: Yes     Comment: Rarely  . Drug Use: No  . Sexual Activity:    Partners: Female   Other Topics Concern  . Not on file   Social History Narrative     Objective: BP 135/88 mmHg  Pulse 59  Wt 190 lb (86.183 kg)  SpO2 95%  General: Alert and Oriented, No Acute Distress HEENT: Pupils equal, round, reactive to light. Conjunctivae clear.  Moist mucous membranes Lungs: Comfortable work of breathing with trace rhonchi in the lower lung fields. No wheezing or rales. Cardiac: Regular rate and rhythm. Normal S1/S2.  No murmurs, rubs, nor gallops.   Extremities: No  peripheral edema.  Strong peripheral pulses.  Mental Status: No depression, anxiety, nor agitation. Skin: Warm and dry.  Assessment & Plan: [name] was seen today for pneumonia.  Diagnoses and all orders for this visit:  CAP (community acquired pneumonia)  Other orders -     HYDROcodone-homatropine (HYCODAN) 5-1.5 MG/5ML syrup; Take 5 mLs by mouth every 8 (eight) hours as needed for cough. -     budesonide-formoterol (SYMBICORT) 80-4.5 MCG/ACT inhaler; Inhale 2 puffs into the lungs 2 (two) times daily.   Community-acquired pneumonia: Improving overall however I like to see if Symbicort will help him with wheezing and continued cough, albuterol and is not much help.. Additionally start as needed Hycodan that should help suppress the cough and alleviate some of the pain in his right side which I attribute to intercostal strain   Return if symptoms worsen or fail to improve.

## 2016-03-04 ENCOUNTER — Telehealth: Payer: Self-pay | Admitting: Family Medicine

## 2016-03-04 MED ORDER — PREDNISONE 20 MG PO TABS: ORAL_TABLET | ORAL | Status: AC

## 2016-03-04 MED ORDER — LEVOFLOXACIN 500 MG PO TABS: 500.0000 mg | ORAL_TABLET | Freq: Every day | ORAL | Status: DC

## 2016-03-04 NOTE — Telephone Encounter (Signed)
Patient called and states that he Seen Dr.Hommel Monday and was told to call back if he is not feeling ANY better. Patient states he hears himself wheezing in his chest and still having side pain. He said Dr.Hommel mentioned calling him in something else. Please Have nurse to call pt and let him know

## 2016-03-04 NOTE — Telephone Encounter (Signed)
Rx of levofloxacin and higher dose prednisone sent to his [facility] pharmacy

## 2016-03-04 NOTE — Telephone Encounter (Signed)
vm left with Rx changes

## 2016-03-11 ENCOUNTER — Ambulatory Visit (INDEPENDENT_AMBULATORY_CARE_PROVIDER_SITE_OTHER): Payer: Commercial Managed Care - PPO

## 2016-03-11 ENCOUNTER — Telehealth: Payer: Self-pay

## 2016-03-11 ENCOUNTER — Telehealth: Payer: Self-pay | Admitting: Family Medicine

## 2016-03-11 DIAGNOSIS — R059 Cough, unspecified: Secondary | ICD-10-CM

## 2016-03-11 DIAGNOSIS — J9811 Atelectasis: Secondary | ICD-10-CM | POA: Diagnosis not present

## 2016-03-11 DIAGNOSIS — R05 Cough: Secondary | ICD-10-CM

## 2016-03-11 MED ORDER — HYDROCODONE-HOMATROPINE 5-1.5 MG/5ML PO SYRP: 5.0000 mL | ORAL_SOLUTION | Freq: Three times a day (TID) | ORAL | Status: DC | PRN

## 2016-03-11 NOTE — Telephone Encounter (Signed)
Damon Conrad, Rx placed in in-box ready for pickup/faxing. CXR order is placed, he can go to radiology at his convenience.

## 2016-03-11 NOTE — Telephone Encounter (Signed)
Pt reports that he is still having a cough and now its causing chest pain.  He finished his last dose of hycodan yesterday and while taking it had a slight cough but that's he's out its worse. He is asking for an chest x-ray. Please advise.

## 2016-03-11 NOTE — Telephone Encounter (Signed)
Pt stated he is still coughing and having pain in his ribs. He want to know if he can get a refill on his cough medicine and wants to know if you would recommend him coming back in. Thanks

## 2016-03-11 NOTE — Telephone Encounter (Signed)
Pt noitfied

## 2016-03-17 ENCOUNTER — Other Ambulatory Visit: Payer: Self-pay | Admitting: Family Medicine

## 2016-03-17 NOTE — Telephone Encounter (Signed)
Are you okay with filling this? Dr. Denyse Amassorey sent it last time and pt has never followed up with you and is not on a daily preventative.

## 2016-03-17 NOTE — Telephone Encounter (Signed)
approved

## 2016-04-16 ENCOUNTER — Telehealth: Payer: Self-pay | Admitting: Family Medicine

## 2016-04-16 NOTE — Telephone Encounter (Signed)
Pt needs to return to clinic for recheck.

## 2016-04-16 NOTE — Telephone Encounter (Signed)
Pt need to return to clinic for recheck

## 2016-04-16 NOTE — Telephone Encounter (Signed)
Previous treatment from Dr. Denyse Amassorey, routing to him for advice.

## 2016-04-16 NOTE — Telephone Encounter (Signed)
Pt reports he is on COLCRYS 0.6 MG daily to prevent Gout flare ups. Pt states he has been having the flare up for about 2 weeks. Questions if he can double up on Rx? Will route to PCP.

## 2016-04-16 NOTE — Telephone Encounter (Signed)
Pt added to PCP schedule for tomorrow.

## 2016-04-17 ENCOUNTER — Ambulatory Visit (INDEPENDENT_AMBULATORY_CARE_PROVIDER_SITE_OTHER): Payer: Commercial Managed Care - PPO | Admitting: Family Medicine

## 2016-04-17 ENCOUNTER — Encounter: Payer: Self-pay | Admitting: Family Medicine

## 2016-04-17 DIAGNOSIS — Z0289 Encounter for other administrative examinations: Secondary | ICD-10-CM

## 2016-04-17 NOTE — Progress Notes (Signed)
No show 04/17/2016  

## 2016-04-21 ENCOUNTER — Telehealth: Payer: Self-pay | Admitting: Family Medicine

## 2016-04-21 ENCOUNTER — Ambulatory Visit (INDEPENDENT_AMBULATORY_CARE_PROVIDER_SITE_OTHER): Payer: Commercial Managed Care - PPO | Admitting: Family Medicine

## 2016-04-21 ENCOUNTER — Encounter: Payer: Self-pay | Admitting: Family Medicine

## 2016-04-21 ENCOUNTER — Ambulatory Visit (INDEPENDENT_AMBULATORY_CARE_PROVIDER_SITE_OTHER): Payer: Commercial Managed Care - PPO

## 2016-04-21 VITALS — BP 146/93 | HR 64 | Wt 196.0 lb

## 2016-04-21 DIAGNOSIS — M1A9XX0 Chronic gout, unspecified, without tophus (tophi): Secondary | ICD-10-CM | POA: Diagnosis not present

## 2016-04-21 DIAGNOSIS — R7989 Other specified abnormal findings of blood chemistry: Secondary | ICD-10-CM

## 2016-04-21 DIAGNOSIS — M79672 Pain in left foot: Secondary | ICD-10-CM | POA: Diagnosis not present

## 2016-04-21 DIAGNOSIS — M84375A Stress fracture, left foot, initial encounter for fracture: Secondary | ICD-10-CM

## 2016-04-21 DIAGNOSIS — I10 Essential (primary) hypertension: Secondary | ICD-10-CM

## 2016-04-21 MED ORDER — TRAMADOL HCL 50 MG PO TABS: 50.0000 mg | ORAL_TABLET | Freq: Three times a day (TID) | ORAL | Status: DC | PRN

## 2016-04-21 MED ORDER — DICLOFENAC SODIUM 50 MG PO TBEC: DELAYED_RELEASE_TABLET | ORAL | Status: DC

## 2016-04-21 MED ORDER — METOPROLOL SUCCINATE ER 100 MG PO TB24: ORAL_TABLET | ORAL | Status: DC

## 2016-04-21 NOTE — Progress Notes (Signed)
CC: Damon Conrad is a 50 y.o. male is here for Gout and Heel Spurs   Subjective: HPI:  Follow-up essential hypertension: He is requesting refills on metoprolol. No outside blood pressures report but when he's in our clinic without pain pressures are more optimal. Denies chest pain shortness of breath orthopnea nor peripheral edema other than some swelling in the left lateral foot.  His major complaint today is left-sided foot pain that he suspect is due to gout. It's been present for the last 3 weeks. It's deep within the foot and accompanied by some swelling and redness. It's tender to the touch and when bearing weight. He denies any recent trauma or overexertion. It feels better if he is a postop shoe, worse if wearing sneakers that are flexible. Other than occasional stiffness in the hands he denies any other pain. He denies fevers, chills, night sweats or unintentional weight loss   Review Of Systems Outlined In HPI  Past Medical History  Diagnosis Date  . HTN (hypertension), benign   . GERD (gastroesophageal reflux disease)     Past Surgical History  Procedure Laterality Date  . Abdominal hernia repair Bilateral     x 2   Family History  Problem Relation Age of Onset  . Alcohol abuse Father   . Cancer Mother   . Hypertension Mother     Social History   Social History  . Marital Status: Single    Spouse Name: N/A  . Number of Children: N/A  . Years of Education: N/A   Occupational History  . Not on file.   Social History Main Topics  . Smoking status: Never Smoker   . Smokeless tobacco: Not on file  . Alcohol Use: Yes     Comment: Rarely  . Drug Use: No  . Sexual Activity:    Partners: Female   Other Topics Concern  . Not on file   Social History Narrative     Objective: BP 146/93 mmHg  Pulse 64  Wt 196 lb (88.905 kg)  Vital signs reviewed. General: Alert and Oriented, No Acute Distress HEENT: Pupils equal, round, reactive to light. Conjunctivae  clear.  External ears unremarkable.  Moist mucous membranes. Lungs: Clear and comfortable work of breathing, speaking in full sentences without accessory muscle use. Cardiac: Regular rate and rhythm.  Neuro: CN II-XII grossly intact, gait normal. Extremities:.  Strong peripheral pulses.  Exam of the left foot reveals mild swelling and redness over the base of the fifth metatarsal.  Slightly tender to touch at the base of the fifth metatarsal.  No other overlying skin changes. Full range of motion in the ankle and forefoot Mental Status: No depression, anxiety, nor agitation. Logical though process. Skin: Warm and dry.  Assessment & Plan: Mechel was seen today for gout and heel spurs.  Diagnoses and all orders for this visit:  Essential hypertension, benign -     metoprolol succinate (TOPROL-XL) 100 MG 24 hr tablet; TAKE ONE TABLET BY MOUTH ONCE DAILY  Chronic gout without tophus, unspecified cause, unspecified site  Left foot pain -     DG Foot Complete Left; Future  Other orders -     diclofenac (VOLTAREN) 50 MG EC tablet; Take one tablet every 8 hours only as needed for pain, take with small snack.    Essential hypertension: Most likely uncontrolled due to his pain today, refill metoprolol with no changes to antihypertensive regimen. Left foot pain: Possibly gout however I like to get an x-ray  to rule out a stress fracture. If x-ray is normal oral send and prednisone taper.  25 minutes spent face-to-face during visit today of which at least 50% was counseling or coordinating care regarding: 1. Essential hypertension, benign   2. Chronic gout without tophus, unspecified cause, unspecified site   3. Left foot pain      Return if symptoms worsen or fail to improve.

## 2016-04-21 NOTE — Telephone Encounter (Signed)
vm left for pt to return call to pharmacy

## 2016-04-21 NOTE — Telephone Encounter (Signed)
Will you please let patient or his wife know that there wasn't any signs of gout on his xray but he actually has a stress fracture on where he's having his pain and swelling.  Treatment for this involves using a solid soled velcro shoe that he's already using and trying to stay off the foot as much as possible.  Since his job requires standing I'd be happy to write him a letter asking that he be switched to a seated job for four weeks.  Also, we need to get a Vitamin D level and a bone density scan sometime in the next few weeks to look into why he got this fracture in the first place. (lab slip/Rx in inbox, will be called about density scan). F/u with me in 2 weeks.

## 2016-04-21 NOTE — Telephone Encounter (Signed)
Wife notified of recommendations

## 2016-04-23 ENCOUNTER — Telehealth: Payer: Self-pay

## 2016-04-23 NOTE — Telephone Encounter (Signed)
Pt returned clinic call, states his work has no option that would allow him to sit. Pt questions if it will be OK for standing at work in the boot or if he should be written out of work completely. Will route.

## 2016-04-23 NOTE — Telephone Encounter (Signed)
Left information on Pt's VM, callback provided for any questions and updates.

## 2016-04-23 NOTE — Telephone Encounter (Signed)
The knee rolling scooter is a perfect idea as long as it lets him still work normally.  Ideally he would use this for at least three weeks.  These can be rented from medical supply stores.

## 2016-04-23 NOTE — Telephone Encounter (Signed)
What I would propose is that he give it a week standing with the boot and if the pain isn't improving then it would be wise for me to write him out of work for 2-3 weeks.

## 2016-04-23 NOTE — Telephone Encounter (Signed)
Pt job don't have any seated positions.  They told him you could write him out for 4 weeks or Rx him one of those knee rolling scooters.  He states that if the fracture isn't that bad then maybe he could continue to work.  He takes 3 20 minute breaks through his work day. Please advise.

## 2016-04-23 NOTE — Telephone Encounter (Signed)
Pt.notified

## 2016-05-07 ENCOUNTER — Ambulatory Visit: Payer: Commercial Managed Care - PPO | Admitting: Sports Medicine

## 2016-05-08 ENCOUNTER — Encounter: Payer: Self-pay | Admitting: Sports Medicine

## 2016-05-08 ENCOUNTER — Ambulatory Visit (INDEPENDENT_AMBULATORY_CARE_PROVIDER_SITE_OTHER): Payer: Commercial Managed Care - PPO | Admitting: Sports Medicine

## 2016-05-08 DIAGNOSIS — M84375D Stress fracture, left foot, subsequent encounter for fracture with routine healing: Secondary | ICD-10-CM | POA: Diagnosis not present

## 2016-05-08 LAB — VITAMIN D 25 HYDROXY (VIT D DEFICIENCY, FRACTURES): Vit D, 25-Hydroxy: 26 ng/mL — ABNORMAL LOW (ref 30–100)

## 2016-05-08 NOTE — Assessment & Plan Note (Signed)
With tenderness mostly over the fourth and third metatarsal shafts, and consistent with a stress fracture. Continue postop shoe, he's been in the shoe now for 3 weeks, lives do it for another 5 weeks. Foot was strapped with compressive dressing. Return in 5 weeks, x-ray before visit.

## 2016-05-08 NOTE — Progress Notes (Signed)
   Subjective:    I'm seeing this patient as a consultation for:  Dr. Laren BoomSean Hommel  CC: Left foot pain  HPI: For over a month this 50 year old male has had pain that he localizes over the third and fourth metatarsals with swelling. X-ray showed some periosteal reaction over the fourth metatarsal. He has only been in a postop shoe now for about 3 weeks. Symptoms are moderate, persistent without radiation. Worse with weightbearing.  Past medical history, Surgical history, Family history not pertinant except as noted below, Social history, Allergies, and medications have been entered into the medical record, reviewed, and no changes needed.   Review of Systems: No headache, visual changes, nausea, vomiting, diarrhea, constipation, dizziness, abdominal pain, skin rash, fevers, chills, night sweats, weight loss, swollen lymph nodes, body aches, joint swelling, muscle aches, chest pain, shortness of breath, mood changes, visual or auditory hallucinations.   Objective:   General: Well Developed, well nourished, and in no acute distress.  Neuro/Psych: Alert and oriented x3, extra-ocular muscles intact, able to move all 4 extremities, sensation grossly intact. Skin: Warm and dry, no rashes noted.  Respiratory: Not using accessory muscles, speaking in full sentences, trachea midline.  Cardiovascular: Pulses palpable, no extremity edema. Abdomen: Does not appear distended. Left Foot: No visible erythema or swelling. Range of motion is full in all directions. Strength is 5/5 in all directions. No hallux valgus. No pes cavus or pes planus. No abnormal callus noted. No pain over the navicular prominence, or base of fifth metatarsal. No tenderness to palpation of the calcaneal insertion of plantar fascia. No pain at the Achilles insertion. No pain over the calcaneal bursa. No pain of the retrocalcaneal bursa. Visibly swollen with tenderness over the third and fourth metatarsal shafts No hallux  rigidus or limitus. No tenderness palpation over interphalangeal joints. No pain with compression of the metatarsal heads. Neurovascularly intact distally.  Impression and Recommendations:   This case required medical decision making of moderate complexity.

## 2016-05-11 NOTE — Addendum Note (Signed)
Addended by: Deno EtienneBARKLEY, Marajade Lei L on: 05/11/2016 07:58 AM   Modules accepted: Orders

## 2016-06-12 ENCOUNTER — Ambulatory Visit: Payer: Commercial Managed Care - PPO | Admitting: Sports Medicine

## 2016-06-15 ENCOUNTER — Ambulatory Visit (INDEPENDENT_AMBULATORY_CARE_PROVIDER_SITE_OTHER): Payer: Commercial Managed Care - PPO | Admitting: Sports Medicine

## 2016-06-15 DIAGNOSIS — M722 Plantar fascial fibromatosis: Secondary | ICD-10-CM | POA: Diagnosis not present

## 2016-06-15 DIAGNOSIS — M84375D Stress fracture, left foot, subsequent encounter for fracture with routine healing: Secondary | ICD-10-CM

## 2016-06-15 NOTE — Progress Notes (Signed)
  Subjective:    CC: F/u L 4th and 5th metatarsal stress fracture   HPI: 50 yo presenting for follow-up 8 weeks after being diagnosed with L 4th and 5th metatarsal stress fractures. He says he has been doing well since his last appointment 5 weeks ago.  He continues to wear post-op shoe but has also tried walking around in tennis shoes.  No pain when wearing post-op shoe or tennis shoes.  Pain has resolved, however, he endorses some intermittent tingling over L lateral 5th toe.  He also complains of pain in his R heel that is worse in the morning - he has orthotics which help a little but pain is continues.        Past medical history, Surgical history, Family history not pertinant except as noted below, Social history, Allergies, and medications have been entered into the medical record, reviewed, and no changes needed.   Review of Systems: No fevers, chills, night sweats, weight loss, chest pain, or shortness of breath.   Objective:    General: Well Developed, well nourished, and in no acute distress.  Neuro: Alert and oriented x3, extra-ocular muscles intact, sensation grossly intact.  HEENT: Normocephalic, atraumatic, pupils equal round reactive to light, neck supple, no masses, no lymphadenopathy, thyroid nonpalpable.  Skin: Warm and dry, no rashes. Cardiac: Regular rate and rhythm, no murmurs rubs or gallops, no lower extremity edema.  Respiratory: Clear to auscultation bilaterally. Not using accessory muscles, speaking in full sentences. Left Foot: No visible erythema or swelling. Range of motion is full in all directions. Strength is 5/5 in all directions. No hallux valgus. No pes cavus or pes planus. No abnormal callus noted. No pain over the navicular prominence, or base of fifth metatarsal. No tenderness to palpation of the calcaneal insertion of plantar fascia. No pain at the Achilles insertion. No pain over the calcaneal bursa. No pain of the retrocalcaneal bursa.  Slight  tenderness to palpation over base of 4th and 5th metatarsal shafts. No hallux rigidus or limitus. No tenderness palpation over interphalangeal joints. No pain with compression of the metatarsal heads. Neurovascularly intact distally. R Foot: No visible erythema or swelling. Range of motion is full in all directions. Strength is 5/5 in all directions. No hallux valgus. No pes cavus or pes planus. No abnormal callus noted. No pain over the navicular prominence, or base of fifth metatarsal. Tenderness to palpation over plantar fascia.  No pain at the Achilles insertion. No pain over the calcaneal bursa. No pain of the retrocalcaneal bursa. No tenderness to palpation over the tarsals, metatarsals, or phalanges. No hallux rigidus or limitus. No tenderness palpation over interphalangeal joints. No pain with compression of the metatarsal heads. Neurovascularly intact distally.   Impression and Recommendations:    1. L 4th and 5th metatarsal stress fracture  -Appears to have improved to the point that there is no pain with walking.  Can transition from post-op shoe to normal footwear.  Call in 1-2 weeks if pain returns, can consider MRI at that time.     2. R heel pain due to plantar fasciitis -Return for custom orthotics  -Given PT exercises

## 2016-06-15 NOTE — Assessment & Plan Note (Signed)
Return for custom orthotics, rehabilitation exercises given.

## 2016-06-15 NOTE — Assessment & Plan Note (Signed)
Essentially pain-free now, may discontinue postop shoe, he has been wearing it for 8 weeks. If persistent pain after a week and a half he'll call me and we can order an MRI.

## 2016-08-21 ENCOUNTER — Other Ambulatory Visit: Payer: Self-pay | Admitting: Family Medicine

## 2016-09-02 ENCOUNTER — Telehealth: Payer: Self-pay | Admitting: Family Medicine

## 2016-09-02 NOTE — Telephone Encounter (Signed)
Damon Conrad pt called in wanting to transfer his care to you. I explained that you are no longer accepting new patients. He was insistent that I at least ask you to accept him. I explained that it was not likely but I would at least ask you. Let me know. Thanks!

## 2016-09-03 NOTE — Telephone Encounter (Signed)
Please schedule him with me.   Thanks for asking.  

## 2016-09-07 ENCOUNTER — Ambulatory Visit: Payer: Commercial Managed Care - PPO | Admitting: Family Medicine

## 2016-09-10 ENCOUNTER — Ambulatory Visit (INDEPENDENT_AMBULATORY_CARE_PROVIDER_SITE_OTHER): Payer: Commercial Managed Care - PPO | Admitting: Family Medicine

## 2016-09-10 VITALS — BP 135/91 | HR 80 | Wt 201.0 lb

## 2016-09-10 DIAGNOSIS — E781 Pure hyperglyceridemia: Secondary | ICD-10-CM

## 2016-09-10 DIAGNOSIS — I10 Essential (primary) hypertension: Secondary | ICD-10-CM | POA: Diagnosis not present

## 2016-09-10 DIAGNOSIS — M1A9XX0 Chronic gout, unspecified, without tophus (tophi): Secondary | ICD-10-CM | POA: Diagnosis not present

## 2016-09-10 DIAGNOSIS — Z114 Encounter for screening for human immunodeficiency virus [HIV]: Secondary | ICD-10-CM

## 2016-09-10 DIAGNOSIS — K219 Gastro-esophageal reflux disease without esophagitis: Secondary | ICD-10-CM | POA: Diagnosis not present

## 2016-09-10 DIAGNOSIS — Z1159 Encounter for screening for other viral diseases: Secondary | ICD-10-CM

## 2016-09-10 MED ORDER — OMEPRAZOLE 40 MG PO CPDR: 40.0000 mg | DELAYED_RELEASE_CAPSULE | Freq: Every day | ORAL | 3 refills | Status: DC

## 2016-09-10 NOTE — Progress Notes (Signed)
       Damon Conrad is a 50 y.o. male who [facility] today for acid reflux and hypertension.  Patient has a history of acid reflux. He notes his symptoms have been worsening recently. He's been taking over-the-counter medication which helps only a little. He no longer is taking Nexium. He notes burning and regurgitation. No shortness of breath chest pain or palpitations.  Attention: Doing well with metoprolol. No chest pain palpitations or shortness of breath. He feels well otherwise.  Gout: Patient has a history of gout with elevated uric acid. It's been sometime since his last gout flare. He takes colchicine and diclofenac only intermittently.   Past Medical History:  Diagnosis Date  . GERD (gastroesophageal reflux disease)   . HTN (hypertension), benign    Past Surgical History:  Procedure Laterality Date  . ABDOMINAL HERNIA REPAIR Bilateral    x 2   Social History  Substance Use Topics  . Smoking status: Never Smoker  . Smokeless tobacco: Not on file  . Alcohol use Yes     Comment: Rarely   family history includes Alcohol abuse in his father; Cancer in his mother; Hypertension in his mother.  ROS as above:  Medications: Current Outpatient Prescriptions  Medication Sig Dispense Refill  . aspirin 325 MG tablet Take 325 mg by mouth daily.    Marland Kitchen. COLCRYS 0.6 MG tablet TAKE ONE TABLET BY MOUTH ONCE DAILY 90 tablet 1  . metoprolol succinate (TOPROL-XL) 100 MG 24 hr tablet TAKE ONE TABLET BY MOUTH ONCE DAILY 90 tablet 1  . omeprazole (PRILOSEC) 40 MG capsule Take 1 capsule (40 mg total) by mouth daily. 30 capsule 3   No current facility-administered medications for this visit.    Allergies  Allergen Reactions  . Meloxicam     rash    Health Maintenance Health Maintenance  Topic Date Due  . Hepatitis C Screening  Jun 28, 1966  . HIV Screening   06/21/1979  . COLONOSCOPY  06/20/2014  . TETANUS/TDAP  09/20/2026 (Originally 06/21/1983)  . INFLUENZA VACCINE  07/03/2028 (Originally 06/02/2016)     Exam:  BP (!) 135/91   Pulse 80   Wt 201 lb (91.2 kg)   BMI 32.44 kg/m  Gen: Well NAD HEENT: EOMI,  MMM Posterior pharynx with tonsillar hypertrophy bilateral. No cervical lymphadenopathy present. Lungs: Normal work of breathing. CTABL Heart: RRR no MRG Abd: NABS, Soft. Nondistended, Nontender Exts: Brisk capillary refill, warm and well perfused.    No results found for this or any previous visit (from the past 72 hour(s)). No results found.    Assessment and Plan: 50 y.o. male with  Acid reflux: Not well-controlled. Plan for daily omeprazole 40 mg. Recheck in one month.  Hypertension reasonably well controlled. Obtain basic fasting labs. Recheck in one month.  Chronic gout: Check uric acid level.  Health maintenance: Obtain HIV and hepatitis C antibodies. Patient declines flu and Tdap.   Orders Placed This Encounter  Procedures  . CBC  . COMPLETE METABOLIC PANEL WITH GFR  . Hemoglobin A1c  . Lipid panel  . Uric acid  . HIV antibody  . Hepatitis C antibody    Discussed warning signs or symptoms. Please see discharge instructions. Patient expresses understanding.

## 2016-09-10 NOTE — Patient Instructions (Signed)
Thank you for coming in today. Start taking omeprazole daily for acid reflux.  Get fasting labs soon.  Recheck in 1 month.    Gastroesophageal Reflux Disease, Adult Normally, food travels down the esophagus and stays in the stomach to be digested. However, when a person has gastroesophageal reflux disease (GERD), food and stomach acid move back up into the esophagus. When this happens, the esophagus becomes sore and inflamed. Over time, GERD can create small holes (ulcers) in the lining of the esophagus.  CAUSES This condition is caused by a problem with the muscle between the esophagus and the stomach (lower esophageal sphincter, or LES). Normally, the LES muscle closes after food passes through the esophagus to the stomach. When the LES is weakened or abnormal, it does not close properly, and that allows food and stomach acid to go back up into the esophagus. The LES can be weakened by certain dietary substances, medicines, and medical conditions, including:  Tobacco use.  Pregnancy.  Having a hiatal hernia.  Heavy alcohol use.  Certain foods and beverages, such as coffee, chocolate, onions, and peppermint. RISK FACTORS This condition is more likely to develop in:  People who have an increased body weight.  People who have connective tissue disorders.  People who use NSAID medicines. SYMPTOMS Symptoms of this condition include:  Heartburn.  Difficult or painful swallowing.  The feeling of having a lump in the throat.  Abitter taste in the mouth.  Bad breath.  Having a large amount of saliva.  Having an upset or bloated stomach.  Belching.  Chest pain.  Shortness of breath or wheezing.  Ongoing (chronic) cough or a night-time cough.  Wearing away of tooth enamel.  Weight loss. Different conditions can cause chest pain. Make sure to see your health care provider if you experience chest pain. DIAGNOSIS Your health care provider will take a medical history and  perform a physical exam. To determine if you have mild or severe GERD, your health care provider may also monitor how you respond to treatment. You may also have other tests, including:  An endoscopy toexamine your stomach and esophagus with a small camera.  A test thatmeasures the acidity level in your esophagus.  A test thatmeasures how much pressure is on your esophagus.  A barium swallow or modified barium swallow to show the shape, size, and functioning of your esophagus. TREATMENT The goal of treatment is to help relieve your symptoms and to prevent complications. Treatment for this condition may vary depending on how severe your symptoms are. Your health care provider may recommend:  Changes to your diet.  Medicine.  Surgery. HOME CARE INSTRUCTIONS Diet  Follow a diet as recommended by your health care provider. This may involve avoiding foods and drinks such as:  Coffee and tea (with or without caffeine).  Drinks that containalcohol.  Energy drinks and sports drinks.  Carbonated drinks or sodas.  Chocolate and cocoa.  Peppermint and mint flavorings.  Garlic and onions.  Horseradish.  Spicy and acidic foods, including peppers, chili powder, curry powder, vinegar, hot sauces, and barbecue sauce.  Citrus fruit juices and citrus fruits, such as oranges, lemons, and limes.  Tomato-based foods, such as red sauce, chili, salsa, and pizza with red sauce.  Fried and fatty foods, such as donuts, french fries, potato chips, and high-fat dressings.  High-fat meats, such as hot dogs and fatty cuts of red and white meats, such as rib eye steak, sausage, ham, and bacon.  High-fat dairy items,  such as whole milk, butter, and cream cheese.  Eat small, frequent meals instead of large meals.  Avoid drinking large amounts of liquid with your meals.  Avoid eating meals during the 2-3 hours before bedtime.  Avoid lying down right after you eat.  Do not exercise right  after you eat. General Instructions  Pay attention to any changes in your symptoms.  Take over-the-counter and prescription medicines only as told by your health care provider. Do not take aspirin, ibuprofen, or other NSAIDs unless your health care provider told you to do so.  Do not use any tobacco products, including cigarettes, chewing tobacco, and e-cigarettes. If you need help quitting, ask your health care provider.  Wear loose-fitting clothing. Do not wear anything tight around your waist that causes pressure on your abdomen.  Raise (elevate) the head of your bed 6 inches (15cm).  Try to reduce your stress, such as with yoga or meditation. If you need help reducing stress, ask your health care provider.  If you are overweight, reduce your weight to an amount that is healthy for you. Ask your health care provider for guidance about a safe weight loss goal.  Keep all follow-up visits as told by your health care provider. This is important. SEEK MEDICAL CARE IF:  You have new symptoms.  You have unexplained weight loss.  You have difficulty swallowing, or it hurts to swallow.  You have wheezing or a persistent cough.  Your symptoms do not improve with treatment.  You have a hoarse voice. SEEK IMMEDIATE MEDICAL CARE IF:  You have pain in your arms, neck, jaw, teeth, or back.  You feel sweaty, dizzy, or light-headed.  You have chest pain or shortness of breath.  You vomit and your vomit looks like blood or coffee grounds.  You faint.  Your stool is bloody or black.  You cannot swallow, drink, or eat.   This information is not intended to replace advice given to you by your health care provider. Make sure you discuss any questions you have with your health care provider.   Document Released: 07/29/2005 Document Revised: 07/10/2015 Document Reviewed: 02/13/2015 [employer] Interactive Patient Education [employer]2016 [employer]

## 2016-09-15 ENCOUNTER — Ambulatory Visit (INDEPENDENT_AMBULATORY_CARE_PROVIDER_SITE_OTHER): Payer: Commercial Managed Care - PPO | Admitting: Family Medicine

## 2016-09-15 ENCOUNTER — Telehealth: Payer: Self-pay | Admitting: Family Medicine

## 2016-09-15 DIAGNOSIS — M9261 Juvenile osteochondrosis of tarsus, right ankle: Secondary | ICD-10-CM

## 2016-09-15 MED ORDER — HYDROCODONE-ACETAMINOPHEN 5-325 MG PO TABS: 1.0000 | ORAL_TABLET | Freq: Four times a day (QID) | ORAL | 0 refills | Status: DC | PRN

## 2016-09-15 MED ORDER — DICLOFENAC SODIUM 1 % TD GEL: 2.0000 g | Freq: Four times a day (QID) | TRANSDERMAL | 11 refills | Status: DC

## 2016-09-15 MED ORDER — TRAMADOL HCL 50 MG PO TABS
50.0000 mg | ORAL_TABLET | Freq: Every evening | ORAL | 0 refills | Status: DC | PRN
Start: 2016-09-15 — End: 2016-09-15

## 2016-09-15 NOTE — Patient Instructions (Signed)
Thank you for coming in today. USe the heel lifts.  Do the exercises we discussed.  Use the voltaren gel and take tramadol at bedtime for pain.  Attend Physical Therapy.    Achilles Tendinitis With Rehab Achilles tendinitis is a disorder of the Achilles tendon. The Achilles tendon connects the large calf muscles (Gastrocnemius and Soleus) to the heel bone (calcaneus). This tendon is sometimes called the heel cord. It is important for pushing-off and standing on your toes and is important for walking, running, or jumping. Tendinitis is often caused by overuse and repetitive microtrauma. SYMPTOMS  Pain, tenderness, swelling, warmth, and redness may occur over the Achilles tendon even at rest.  Pain with pushing off, or flexing or extending the ankle.  Pain that is worsened after or during activity. CAUSES   Overuse sometimes seen with rapid increase in exercise programs or in sports requiring running and jumping.  Poor physical conditioning (strength and flexibility or endurance).  Running sports, especially training running down hills.  Inadequate warm-up before practice or play or failure to stretch before participation.  Injury to the tendon. PREVENTION   Warm up and stretch before practice or competition.  Allow time for adequate rest and recovery between practices and competition.  Keep up conditioning.  Keep up ankle and leg flexibility.  Improve or keep muscle strength and endurance.  Improve cardiovascular fitness.  Use proper technique.  Use proper equipment (shoes, skates).  To help prevent recurrence, taping, protective strapping, or an adhesive bandage may be recommended for several weeks after healing is complete. PROGNOSIS   Recovery may take weeks to several months to heal.  Longer recovery is expected if symptoms have been prolonged.  Recovery is usually quicker if the inflammation is due to a direct blow as compared with overuse or sudden  strain. RELATED COMPLICATIONS   Healing time will be prolonged if the condition is not correctly treated. The injury must be given plenty of time to heal.  Symptoms can reoccur if activity is resumed too soon.  Untreated, tendinitis may increase the risk of tendon rupture requiring additional time for recovery and possibly surgery. TREATMENT   The first treatment consists of rest anti-inflammatory medication, and ice to relieve the pain.  Stretching and strengthening exercises after resolution of pain will likely help reduce the risk of recurrence. Referral to a physical therapist or athletic trainer for further evaluation and treatment may be helpful.  A walking boot or cast may be recommended to rest the Achilles tendon. This can help break the cycle of inflammation and microtrauma.  Arch supports (orthotics) may be prescribed or recommended by your caregiver as an adjunct to therapy and rest.  Surgery to remove the inflamed tendon lining or degenerated tendon tissue is rarely necessary and has shown less than predictable results. MEDICATION   Nonsteroidal anti-inflammatory medications, such as aspirin and ibuprofen, may be used for pain and inflammation relief. Do not take within 7 days before surgery. Take these as directed by your caregiver. Contact your caregiver immediately if any bleeding, stomach upset, or signs of allergic reaction occur. Other minor pain relievers, such as acetaminophen, may also be used.  Pain relievers may be prescribed as necessary by your caregiver. Do not take prescription pain medication for longer than 4 to 7 days. Use only as directed and only as much as you need.  Cortisone injections are rarely indicated. Cortisone injections may weaken tendons and predispose to rupture. It is better to give the condition more time  to heal than to use them. HEAT AND COLD  Cold is used to relieve pain and reduce inflammation for acute and chronic Achilles tendinitis.  Cold should be applied for 10 to 15 minutes every 2 to 3 hours for inflammation and pain and immediately after any activity that aggravates your symptoms. Use ice packs or an ice massage.  Heat may be used before performing stretching and strengthening activities prescribed by your caregiver. Use a heat pack or a warm soak. SEEK MEDICAL CARE IF:  Symptoms get worse or do not improve in 2 weeks despite treatment.  New, unexplained symptoms develop. Drugs used in treatment may produce side effects. EXERCISES RANGE OF MOTION (ROM) AND STRETCHING EXERCISES - Achilles Tendinitis  These exercises may help you when beginning to rehabilitate your injury. Your symptoms may resolve with or without further involvement from your physician, physical therapist or athletic trainer. While completing these exercises, remember:   Restoring tissue flexibility helps normal motion to return to the joints. This allows healthier, less painful movement and activity.  An effective stretch should be held for at least 30 seconds.  A stretch should never be painful. You should only feel a gentle lengthening or release in the stretched tissue. STRETCH - Gastroc, Standing   Place hands on wall.  Extend right / left leg, keeping the front knee somewhat bent.  Slightly point your toes inward on your back foot.  Keeping your right / left heel on the floor and your knee straight, shift your weight toward the wall, not allowing your back to arch.  You should feel a gentle stretch in the right / left calf. Hold this position for __________ seconds. Repeat __________ times. Complete this stretch __________ times per day. STRETCH - Soleus, Standing   Place hands on wall.  Extend right / left leg, keeping the other knee somewhat bent.  Slightly point your toes inward on your back foot.  Keep your right / left heel on the floor, bend your back knee, and slightly shift your weight over the back leg so that you feel a  gentle stretch deep in your back calf.  Hold this position for __________ seconds. Repeat __________ times. Complete this stretch __________ times per day. STRETCH - Gastrocsoleus, Standing  Note: This exercise can place a lot of stress on your foot and ankle. Please complete this exercise only if specifically instructed by your caregiver.   Place the ball of your right / left foot on a step, keeping your other foot firmly on the same step.  Hold on to the wall or a rail for balance.  Slowly lift your other foot, allowing your body weight to press your heel down over the edge of the step.  You should feel a stretch in your right / left calf.  Hold this position for __________ seconds.  Repeat this exercise with a slight bend in your knee. Repeat __________ times. Complete this stretch __________ times per day.  STRENGTHENING EXERCISES - Achilles Tendinitis These exercises may help you when beginning to rehabilitate your injury. They may resolve your symptoms with or without further involvement from your physician, physical therapist or athletic trainer. While completing these exercises, remember:   Muscles can gain both the endurance and the strength needed for everyday activities through controlled exercises.  Complete these exercises as instructed by your physician, physical therapist or athletic trainer. Progress the resistance and repetitions only as guided.  You may experience muscle soreness or fatigue, but the pain or  discomfort you are trying to eliminate should never worsen during these exercises. If this pain does worsen, stop and make certain you are following the directions exactly. If the pain is still present after adjustments, discontinue the exercise until you can discuss the trouble with your clinician. STRENGTH - Plantar-flexors   Sit with your right / left leg extended. Holding onto both ends of a rubber exercise band/tubing, loop it around the ball of your foot. Keep  a slight tension in the band.  Slowly push your toes away from you, pointing them downward.  Hold this position for __________ seconds. Return slowly, controlling the tension in the band/tubing. Repeat __________ times. Complete this exercise __________ times per day.  STRENGTH - Plantar-flexors   Stand with your feet shoulder width apart. Steady yourself with a wall or table using as little support as needed.  Keeping your weight evenly spread over the width of your feet, rise up on your toes.*  Hold this position for __________ seconds. Repeat __________ times. Complete this exercise __________ times per day.  *If this is too easy, shift your weight toward your right / left leg until you feel challenged. Ultimately, you may be asked to do this exercise with your right / left foot only. STRENGTH - Plantar-flexors, Eccentric  Note: This exercise can place a lot of stress on your foot and ankle. Please complete this exercise only if specifically instructed by your caregiver.   Place the balls of your feet on a step. With your hands, use only enough support from a wall or rail to keep your balance.  Keep your knees straight and rise up on your toes.  Slowly shift your weight entirely to your right / left toes and pick up your opposite foot. Gently and with controlled movement, lower your weight through your right / left foot so that your heel drops below the level of the step. You will feel a slight stretch in the back of your calf at the end position.  Use the healthy leg to help rise up onto the balls of both feet, then lower weight only on the right / left leg again. Build up to 15 repetitions. Then progress to 3 consecutive sets of 15 repetitions.*  After completing the above exercise, complete the same exercise with a slight knee bend (about 30 degrees). Again, build up to 15 repetitions. Then progress to 3 consecutive sets of 15 repetitions.* Perform this exercise __________ times per  day.  *When you easily complete 3 sets of 15, your physician, physical therapist or athletic trainer may advise you to add resistance by wearing a backpack filled with additional weight. STRENGTH - Plantar Flexors, Seated   Sit on a chair that allows your feet to rest flat on the ground. If necessary, sit at the edge of the chair.  Keeping your toes firmly on the ground, lift your right / left heel as far as you can without increasing any discomfort in your ankle. Repeat __________ times. Complete this exercise __________ times a day. *If instructed by your physician, physical therapist or athletic trainer, you may add ____________________ of resistance by placing a weighted object on your right / left knee.   This information is not intended to replace advice given to you by your health care provider. Make sure you discuss any questions you have with your health care provider.   Document Released: 05/20/2005 Document Revised: 11/09/2014 Document Reviewed: 01/31/2009 Elsevier Interactive Patient Education [employer]2016 Elsevier Inc.

## 2016-09-15 NOTE — Progress Notes (Addendum)
Damon Conrad is a 50 y.o. male who presents to [facility] today for right heel pain. Patient has a three-day history of right posterior calcaneus pain. He denies any injury. He notes pain has been slowly worsening. He developed a significant limp and worsening pain after working today. He denies any fevers chills nausea vomiting or diarrhea. He's tried some gel heel pads and over-the-counter medicines for pain which have not helped much.   Past Medical History:  Diagnosis Date  . GERD (gastroesophageal reflux disease)   . HTN (hypertension), benign    Past Surgical History:  Procedure Laterality Date  . ABDOMINAL HERNIA REPAIR Bilateral    x 2   Social History  Substance Use Topics  . Smoking status: Never Smoker  . Smokeless tobacco: Not on file  . Alcohol use Yes     Comment: Rarely     ROS:  As above   Medications: Current Outpatient Prescriptions  Medication Sig Dispense Refill  . aspirin 325 MG tablet Take 325 mg by mouth daily.    Marland Kitchen. COLCRYS 0.6 MG tablet TAKE ONE TABLET BY MOUTH ONCE DAILY 90 tablet 1  . metoprolol succinate (TOPROL-XL) 100 MG 24 hr tablet TAKE ONE TABLET BY MOUTH ONCE DAILY 90 tablet 1  . omeprazole (PRILOSEC) 40 MG capsule Take 1 capsule (40 mg total) by mouth daily. 30 capsule 3  . diclofenac sodium (VOLTAREN) 1 % GEL Apply 2 g topically 4 (four) times daily. To affected joint. 100 g 11  . traMADol (ULTRAM) 50 MG tablet Take 1 tablet (50 mg total) by mouth at bedtime as needed for severe pain. 15 tablet 0   No current facility-administered medications for this visit.    Allergies  Allergen Reactions  . Meloxicam     rash     Exam:  BP 134/87   Pulse (!) 55   Wt 200 lb (90.7 kg)   BMI 32.28 kg/m  General: Well Developed, well nourished, and in no acute distress.  Neuro/Psych: Alert and oriented x3, extra-ocular muscles intact, able to move all 4 extremities, sensation grossly intact. Skin:  Warm and dry, no rashes noted.  Respiratory: Not using accessory muscles, speaking in full sentences, trachea midline.  Cardiovascular: Pulses palpable, no extremity edema. Abdomen: Does not appear distended. MSK: Right Foot is normal-appearing.  Tender palpation posterior calcaneus at the insertion of the Achilles tendon. Foot and ankle motion are intact. Pulses capillary refill and sensation are intact distally. Significant antalgic gait.  Limited musculoskeletal ultrasound of the right posterior ankle: Achilles tendon is intact appearing under the insertion of the calcaneus. Hyperechoic calcifications seen within the distal insertion consistent with agonal deformity. Increased vascular activity surrounding the calcium deposits are present indicating inflammation.     No results found for this or any previous visit (from the past 48 hour(s)). No results found.    Assessment and Plan: 50 y.o. male with insertional Achilles tendinitis with Haglund deformity. Plan to treat with diclofenac gel physical therapy home therapy heel lifts and rest. Recheck in 2 weeks. Work note provided.    Orders Placed This Encounter  Procedures  . Ambulatory referral to Physical Therapy    Referral Priority:   Routine    Referral Type:   Physical Medicine    Referral Reason:   Specialty Services Required    Requested Specialty:   Physical Therapy    Number of Visits Requested:   1    Discussed warning signs or  symptoms. Please see discharge instructions. Patient expresses understanding.   Patient return to clinic on November 15. He was no better. Plan for prednisone Dosepak and Norco. Patient return tramadol prescription. Patient was uncomfortable with cam walker boot. Plan to recheck in the near future if not better.

## 2016-09-15 NOTE — Telephone Encounter (Signed)
Pt called. He will come by tomorrow to pick up script for Norco and also will bring script for Tramadol.

## 2016-09-15 NOTE — Telephone Encounter (Signed)
Pt seen Dr. Denyse Amassorey today and he was given Tramadol and he [facility] and he has some left over from last time because the pain med does not work for him. Patient wants to know if you can change his med to something else? Call patient back at [phone]

## 2016-09-15 NOTE — Telephone Encounter (Signed)
[facility]  Pt should bring in the pills or the prescription for Tramadol in exchange for the Rx of Norco.

## 2016-09-16 ENCOUNTER — Encounter: Payer: Self-pay | Admitting: Family Medicine

## 2016-09-16 LAB — CBC
HEMATOCRIT: 41.2 % (ref 38.5–50.0)
HEMOGLOBIN: 13.8 g/dL (ref 13.2–17.1)
MCH: 29.9 pg (ref 27.0–33.0)
MCHC: 33.5 g/dL (ref 32.0–36.0)
MCV: 89.4 fL (ref 80.0–100.0)
MPV: 10.3 fL (ref 7.5–12.5)
Platelets: 173 10*3/uL (ref 140–400)
RBC: 4.61 MIL/uL (ref 4.20–5.80)
RDW: 13.4 % (ref 11.0–15.0)
WBC: 6.5 10*3/uL (ref 3.8–10.8)

## 2016-09-16 LAB — URIC ACID: URIC ACID, SERUM: 9.1 mg/dL — AB (ref 4.0–8.0)

## 2016-09-16 LAB — COMPLETE METABOLIC PANEL WITH GFR
ALBUMIN: 3.8 g/dL (ref 3.6–5.1)
ALK PHOS: 64 U/L (ref 40–115)
ALT: 30 U/L (ref 9–46)
AST: 24 U/L (ref 10–35)
BUN: 13 mg/dL (ref 7–25)
CALCIUM: 9.4 mg/dL (ref 8.6–10.3)
CO2: 28 mmol/L (ref 20–31)
CREATININE: 1.16 mg/dL (ref 0.70–1.33)
Chloride: 104 mmol/L (ref 98–110)
GFR, Est African American: 83 mL/min (ref >=60)
GFR, Est Non African American: 72 mL/min (ref >=60)
GLUCOSE: 105 mg/dL — AB (ref 65–99)
Potassium: 4.6 mmol/L (ref 3.5–5.3)
Sodium: 139 mmol/L (ref 135–146)
TOTAL PROTEIN: 6.8 g/dL (ref 6.1–8.1)
Total Bilirubin: 0.5 mg/dL (ref 0.2–1.2)

## 2016-09-16 LAB — HIV ANTIBODY (ROUTINE TESTING W REFLEX): HIV 1&2 Ab, 4th Generation: NONREACTIVE

## 2016-09-16 LAB — LIPID PANEL
Cholesterol: 162 mg/dL (ref <200)
HDL: 26 mg/dL — AB (ref >40)
LDL CALC: 98 mg/dL (ref <100)
Total CHOL/HDL Ratio: 6.2 Ratio — ABNORMAL HIGH (ref <5.0)
Triglycerides: 190 mg/dL — ABNORMAL HIGH (ref <150)
VLDL: 38 mg/dL — AB (ref <30)

## 2016-09-16 LAB — HEMOGLOBIN A1C
HEMOGLOBIN A1C: 5.5 % (ref <5.7)
Mean Plasma Glucose: 111 mg/dL

## 2016-09-16 LAB — HEPATITIS C ANTIBODY: HCV AB: NEGATIVE

## 2016-09-16 MED ORDER — PREDNISONE 5 MG (48) PO TBPK: ORAL_TABLET | ORAL | 0 refills | Status: DC

## 2016-09-16 NOTE — Addendum Note (Signed)
Addended by: Rodolph BongOREY, EVAN S on: 09/16/2016 11:59 AM   Modules accepted: Orders

## 2016-10-08 ENCOUNTER — Ambulatory Visit (INDEPENDENT_AMBULATORY_CARE_PROVIDER_SITE_OTHER): Payer: Commercial Managed Care - PPO | Admitting: Family Medicine

## 2016-10-08 ENCOUNTER — Encounter: Payer: Self-pay | Admitting: Family Medicine

## 2016-10-08 VITALS — BP 138/95 | HR 67 | Wt 200.0 lb

## 2016-10-08 DIAGNOSIS — M9261 Juvenile osteochondrosis of tarsus, right ankle: Secondary | ICD-10-CM | POA: Diagnosis not present

## 2016-10-08 DIAGNOSIS — I10 Essential (primary) hypertension: Secondary | ICD-10-CM | POA: Diagnosis not present

## 2016-10-08 DIAGNOSIS — J358 Other chronic diseases of tonsils and adenoids: Secondary | ICD-10-CM

## 2016-10-08 MED ORDER — LISINOPRIL 10 MG PO TABS: 10.0000 mg | ORAL_TABLET | Freq: Every day | ORAL | 1 refills | Status: DC

## 2016-10-08 NOTE — Progress Notes (Signed)
Damon Conrad is a 50 y.o. male who presents to [facility] today for follow up of insertional Achilles tendinitis with Haglund deformity.  His heel pain is much better, which he attributes to the course of oral prednisone that he finished 1 week ago. 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 Also has been doing heel lift exercises at home. Feels like shoe inserts may have helped somewhat, but diclofenac gel has not really helped.   Additionally he notes continued hypertension. He occasionally will feel occasional chest pain. This is nonradiating and nonexertional. This is been seen previously. He attributes this to his elevated blood pressure. He checks his blood pressure at home and notes it's typically in the 130s to 140s. He feels well and like to address this issue further at an upcoming visit.  Additionally he notes a continued feeling of fullness at the posterior aspect of his throat. He notes this is not particularly painful. He does see a small whitish lesion on the left side of his tonsil erythema wonders what this could be. He denies any problems swallowing or gagging.  Past Medical History:  Diagnosis Date  . GERD (gastroesophageal reflux disease)   . HTN (hypertension), benign    Past Surgical History:  Procedure Laterality Date  . ABDOMINAL HERNIA REPAIR Bilateral    x 2   Social History  Substance Use Topics  . Smoking status: Never Smoker  . Smokeless tobacco: Not on file  . Alcohol use Yes     Comment: Rarely     ROS:  As above   Medications: Current Outpatient Prescriptions  Medication Sig Dispense Refill  . aspirin 325 MG tablet Take 325 mg by mouth daily.    Marland Kitchen. COLCRYS 0.6 MG tablet TAKE ONE TABLET BY MOUTH ONCE DAILY 90 tablet 1  . diclofenac sodium (VOLTAREN) 1 % GEL Apply 2 g topically 4 (four) times daily. To affected joint. 100 g 11  . HYDROcodone-acetaminophen  (NORCO/VICODIN) 5-325 MG tablet Take 1 tablet by mouth every 6 (six) hours as needed. 15 tablet 0  . metoprolol succinate (TOPROL-XL) 100 MG 24 hr tablet TAKE ONE TABLET BY MOUTH ONCE DAILY 90 tablet 1  . omeprazole (PRILOSEC) 40 MG capsule Take 1 capsule (40 mg total) by mouth daily. 30 capsule 3  . predniSONE (STERAPRED UNI-PAK 48 TAB) 5 MG (48) TBPK tablet 12 day dosepack po 48 tablet 0   No current facility-administered medications for this visit.    Allergies  Allergen Reactions  . Meloxicam     rash     Exam:  BP (!) 138/95   Pulse 67   Wt 200 lb (90.7 kg)   BMI 32.28 kg/m  General: Well Developed, well nourished, and in no acute distress.  HEENT: Tonsil stone with mild tonsillar erythema left side. No uvula deviation. Neuro/Psych: Alert and oriented x3, extra-ocular muscles intact, able to move all 4 extremities, sensation grossly intact. Skin: Warm and dry, no rashes noted.  Respiratory: Not using accessory muscles, speaking in full sentences, trachea midline.  Cardiovascular: Pulses palpable, no extremity edema. Abdomen: Does not appear distended. MSK:  Right foot:  Normal appearing, no edema Mild tenderness to palpation at the insertion of the Achilles tendon on the posterior calcaneus Full ROM of foot and ankle Pulses, capillary refill, and sensation are intact distally   No results found for this or any previous visit (from the past 48  hour(s)). No results found.    Assessment and Plan: 50 y.o. male with insertional Achilles tendinitis with Haglund deformity, hypertension, and tonsil stone.  Insertional Achilles tendonitis with Haglund deformity: Improved after prednisone. Continue home therapy  Hypertension: Continue metoprolol, start lisinopril. We'll work this issue up further in about a month.  Tonsil stone: Expected to self-resolve. ENT referral if no improvement.  Follow up in 1 month.  No orders of the defined types were placed in this  encounter.   Discussed warning signs or symptoms. Please see discharge instructions. Patient expresses understanding.  This patient was seen and interviewed and examined independently by myself. Clementeen GrahamEvan Corey, MD

## 2016-10-08 NOTE — Patient Instructions (Addendum)
Thank you for coming in today. Continue the exercises.  Return as needed.   Continue metoprolol  START lisinopril.  You have a tonsil stone.   This will get better.  If it does not we will get you to an ENT doctor to remove it.   Return in 1 month.  We will recheck blood pressure then.

## 2016-10-15 ENCOUNTER — Other Ambulatory Visit: Payer: Self-pay | Admitting: Family Medicine

## 2016-10-16 ENCOUNTER — Telehealth: Payer: Self-pay | Admitting: Family Medicine

## 2016-10-16 MED ORDER — PREDNISONE 5 MG (48) PO TBPK: ORAL_TABLET | ORAL | 0 refills | Status: DC

## 2016-10-16 NOTE — Telephone Encounter (Signed)
Patient called and states his Rt foot is hurting again and you wanted him to call and let you know if it does. He also states that the prednisone worked well but he is out of it, so can you please call [facility] [address] [place]? Call and let paitent know

## 2016-10-16 NOTE — Telephone Encounter (Signed)
Prednisone sent to pharmacy

## 2016-10-27 ENCOUNTER — Other Ambulatory Visit: Payer: Self-pay

## 2016-10-27 DIAGNOSIS — I10 Essential (primary) hypertension: Secondary | ICD-10-CM

## 2016-10-27 MED ORDER — METOPROLOL SUCCINATE ER 100 MG PO TB24: ORAL_TABLET | ORAL | 0 refills | Status: DC

## 2016-11-09 ENCOUNTER — Ambulatory Visit: Payer: Commercial Managed Care - PPO | Admitting: Family Medicine

## 2016-12-01 ENCOUNTER — Other Ambulatory Visit: Payer: Self-pay

## 2016-12-01 DIAGNOSIS — K219 Gastro-esophageal reflux disease without esophagitis: Secondary | ICD-10-CM

## 2016-12-01 MED ORDER — ESOMEPRAZOLE MAGNESIUM 20 MG PO CPDR: 20.0000 mg | DELAYED_RELEASE_CAPSULE | Freq: Every day | ORAL | 3 refills | Status: DC

## 2016-12-01 NOTE — Progress Notes (Signed)
Pt called stating that he would like to change his rx for omeprazole to esomeprazole. rx ordered. Pt advised to discontinue omeprazole.

## 2017-01-12 ENCOUNTER — Ambulatory Visit (INDEPENDENT_AMBULATORY_CARE_PROVIDER_SITE_OTHER): Payer: Commercial Managed Care - PPO | Admitting: Family Medicine

## 2017-01-12 VITALS — BP 146/50 | HR 59 | Temp 98.2°F | Wt 200.0 lb

## 2017-01-12 DIAGNOSIS — J40 Bronchitis, not specified as acute or chronic: Secondary | ICD-10-CM | POA: Diagnosis not present

## 2017-01-12 DIAGNOSIS — J039 Acute tonsillitis, unspecified: Secondary | ICD-10-CM | POA: Diagnosis not present

## 2017-01-12 DIAGNOSIS — J358 Other chronic diseases of tonsils and adenoids: Secondary | ICD-10-CM | POA: Diagnosis not present

## 2017-01-12 MED ORDER — AZITHROMYCIN 250 MG PO TABS: 250.0000 mg | ORAL_TABLET | Freq: Every day | ORAL | 0 refills | Status: DC

## 2017-01-12 MED ORDER — CEFDINIR 300 MG PO CAPS: 300.0000 mg | ORAL_CAPSULE | Freq: Two times a day (BID) | ORAL | 0 refills | Status: DC

## 2017-01-12 MED ORDER — PREDNISONE 10 MG PO TABS: 30.0000 mg | ORAL_TABLET | Freq: Every day | ORAL | 0 refills | Status: DC

## 2017-01-12 MED ORDER — GUAIFENESIN-CODEINE 100-10 MG/5ML PO SOLN: 5.0000 mL | Freq: Every evening | ORAL | 0 refills | Status: DC | PRN

## 2017-01-12 NOTE — Patient Instructions (Signed)
Thank you for coming in today. Take prednisone and the two antibiotics.  Recheck as needed.  Use the cough medicine at night as needed.  Call or go to the emergency room if you get worse, have trouble breathing, have chest pains, or palpitations.    Acute Bronchitis, Adult Acute bronchitis is when air tubes (bronchi) in the lungs suddenly get swollen. The condition can make it hard to breathe. It can also cause these symptoms:  A cough.  Coughing up clear, yellow, or green mucus.  Wheezing.  Chest congestion.  Shortness of breath.  A fever.  Body aches.  Chills.  A sore throat. Follow these instructions at home: Medicines   Take over-the-counter and prescription medicines only as told by your doctor.  If you were prescribed an antibiotic medicine, take it as told by your doctor. Do not stop taking the antibiotic even if you start to feel better. General instructions   Rest.  Drink enough fluids to keep your pee (urine) clear or pale yellow.  Avoid smoking and secondhand smoke. If you smoke and you need help quitting, ask your doctor. Quitting will help your lungs heal faster.  Use an inhaler, cool mist vaporizer, or humidifier as told by your doctor.  Keep all follow-up visits as told by your doctor. This is important. How is this prevented? To lower your risk of getting this condition again:  Wash your hands often with soap and water. If you cannot use soap and water, use hand sanitizer.  Avoid contact with people who have cold symptoms.  Try not to touch your hands to your mouth, nose, or eyes.  Make sure to get the flu shot every year. Contact a doctor if:  Your symptoms do not get better in 2 weeks. Get help right away if:  You cough up blood.  You have chest pain.  You have very bad shortness of breath.  You become dehydrated.  You faint (pass out) or keep feeling like you are going to pass out.  You keep throwing up (vomiting).  You have a  very bad headache.  Your fever or chills gets worse. This information is not intended to replace advice given to you by your health care provider. Make sure you discuss any questions you have with your health care provider. Document Released: 04/06/2008 Document Revised: 05/27/2016 Document Reviewed: 04/08/2016 Elsevier Interactive Patient Education  2017 [employer]

## 2017-01-12 NOTE — Progress Notes (Signed)
Damon Conrad is a 51 y.o. male who [facility] today for cough congestion runny nose and sore throat. Symptoms present for a few days now. Patient notes he had an episode of pneumonia previously that was hard to treat. He denies vomiting diarrhea chest pain or palpitation or shortness of breath. He denies significant wheezing. He's been trying over-the-counter medications which have not helped well.   Past Medical History:  Diagnosis Date  . GERD (gastroesophageal reflux disease)   . HTN (hypertension), benign    Past Surgical History:  Procedure Laterality Date  . ABDOMINAL HERNIA REPAIR Bilateral    x 2   Social History  Substance Use Topics  . Smoking status: Never Smoker  . Smokeless tobacco: Not on file  . Alcohol use Yes     Comment: Rarely   family history includes Alcohol abuse in his father; Cancer in his mother; Hypertension in his mother.  ROS as above:  Medications: Current Outpatient Prescriptions  Medication Sig Dispense Refill  . aspirin 325 MG tablet Take 325 mg by mouth daily.    Marland Kitchen. COLCRYS 0.6 MG tablet TAKE ONE TABLET BY MOUTH ONCE DAILY 90 tablet 1  . diclofenac sodium (VOLTAREN) 1 % GEL Apply 2 g topically 4 (four) times daily. To affected joint. 100 g 11  . esomeprazole (NEXIUM) 20 MG capsule Take 1 capsule (20 mg total) by mouth daily. 30 capsule 3  . lisinopril (PRINIVIL,ZESTRIL) 10 MG tablet Take 1 tablet (10 mg total) by mouth daily. 30 tablet 1  . metoprolol succinate (TOPROL-XL) 100 MG 24 hr tablet TAKE ONE TABLET BY MOUTH ONCE DAILY 90 tablet 0  . azithromycin (ZITHROMAX) 250 MG tablet Take 1 tablet (250 mg total) by mouth daily. Take first 2 tablets together, then 1 every day until finished. 6 tablet 0  . cefdinir (OMNICEF) 300 MG capsule Take 1 capsule (300 mg total) by mouth 2 (two) times daily. 14 capsule 0  .  guaiFENesin-codeine 100-10 MG/5ML syrup Take 5 mLs by mouth at bedtime as needed for cough. 236 mL 0  . predniSONE (DELTASONE) 10 MG tablet Take 3 tablets (30 mg total) by mouth daily with breakfast. 15 tablet 0   No current facility-administered medications for this visit.    Allergies  Allergen Reactions  . Meloxicam     rash    Health Maintenance Health Maintenance  Topic Date Due  . COLONOSCOPY  06/20/2014  . TETANUS/TDAP  09/20/2026 (Originally 06/21/1983)  . INFLUENZA VACCINE  07/03/2028 (Originally 06/02/2016)  . Hepatitis C Screening  Completed  . HIV Screening  Completed     Exam:  BP (!) 146/50   Pulse (!) 59   Temp 98.2 F (36.8 C) (Oral)   Wt 200 lb (90.7 kg)   SpO2 96%   BMI 32.28 kg/m  Gen: Well NAD HEENT: EOMI,  MMM Posterior pharynx shows an enlarged left tonsil with a tonsil stone and mild erythema. Posterior pharynx is otherwise normal appearing no midline shift or uvula deviation. No trismus. Tympanic membranes are normal-appearing bilaterally. Clear nasal discharge is present bilaterally. Tender palpation bilateral maxillary sinuses. Inflamed nasal turbinates are present bilateral Lungs: Normal work of breathing. CTABL Heart: RRR no MRG Abd: NABS, Soft. Nondistended, Nontender Exts: Brisk capillary refill, warm and well perfused.    No results found for this or any previous visit (from the past 72 hour(s)). No results found.    Assessment and  Plan: 51 y.o. male with viral sinusitis with bronchitis and possible tonsillitis with tonsil stone. Treat empirically with prednisone Omnicef and azithromycin. Use codeine cough syrup. Return as needed.   No orders of the defined types were placed in this encounter.  Meds ordered this encounter  Medications  . predniSONE (DELTASONE) 10 MG tablet    Sig: Take 3 tablets (30 mg total) by mouth daily with breakfast.    Dispense:  15 tablet    Refill:  0  . cefdinir (OMNICEF) 300 MG capsule    Sig: Take 1  capsule (300 mg total) by mouth 2 (two) times daily.    Dispense:  14 capsule    Refill:  0  . azithromycin (ZITHROMAX) 250 MG tablet    Sig: Take 1 tablet (250 mg total) by mouth daily. Take first 2 tablets together, then 1 every day until finished.    Dispense:  6 tablet    Refill:  0  . guaiFENesin-codeine 100-10 MG/5ML syrup    Sig: Take 5 mLs by mouth at bedtime as needed for cough.    Dispense:  236 mL    Refill:  0     Discussed warning signs or symptoms. Please see discharge instructions. Patient expresses understanding.

## 2017-01-26 ENCOUNTER — Other Ambulatory Visit: Payer: Self-pay | Admitting: Family Medicine

## 2017-01-26 DIAGNOSIS — I10 Essential (primary) hypertension: Secondary | ICD-10-CM

## 2017-01-27 IMAGING — DX DG CHEST 2V
2 series · 2 of 2 positions shown · non-contrast
Comparison: 02/24/2016

CLINICAL DATA: Pneumonia 2 weeks ago

EXAM:
CHEST  2 VIEW

[chest pa]
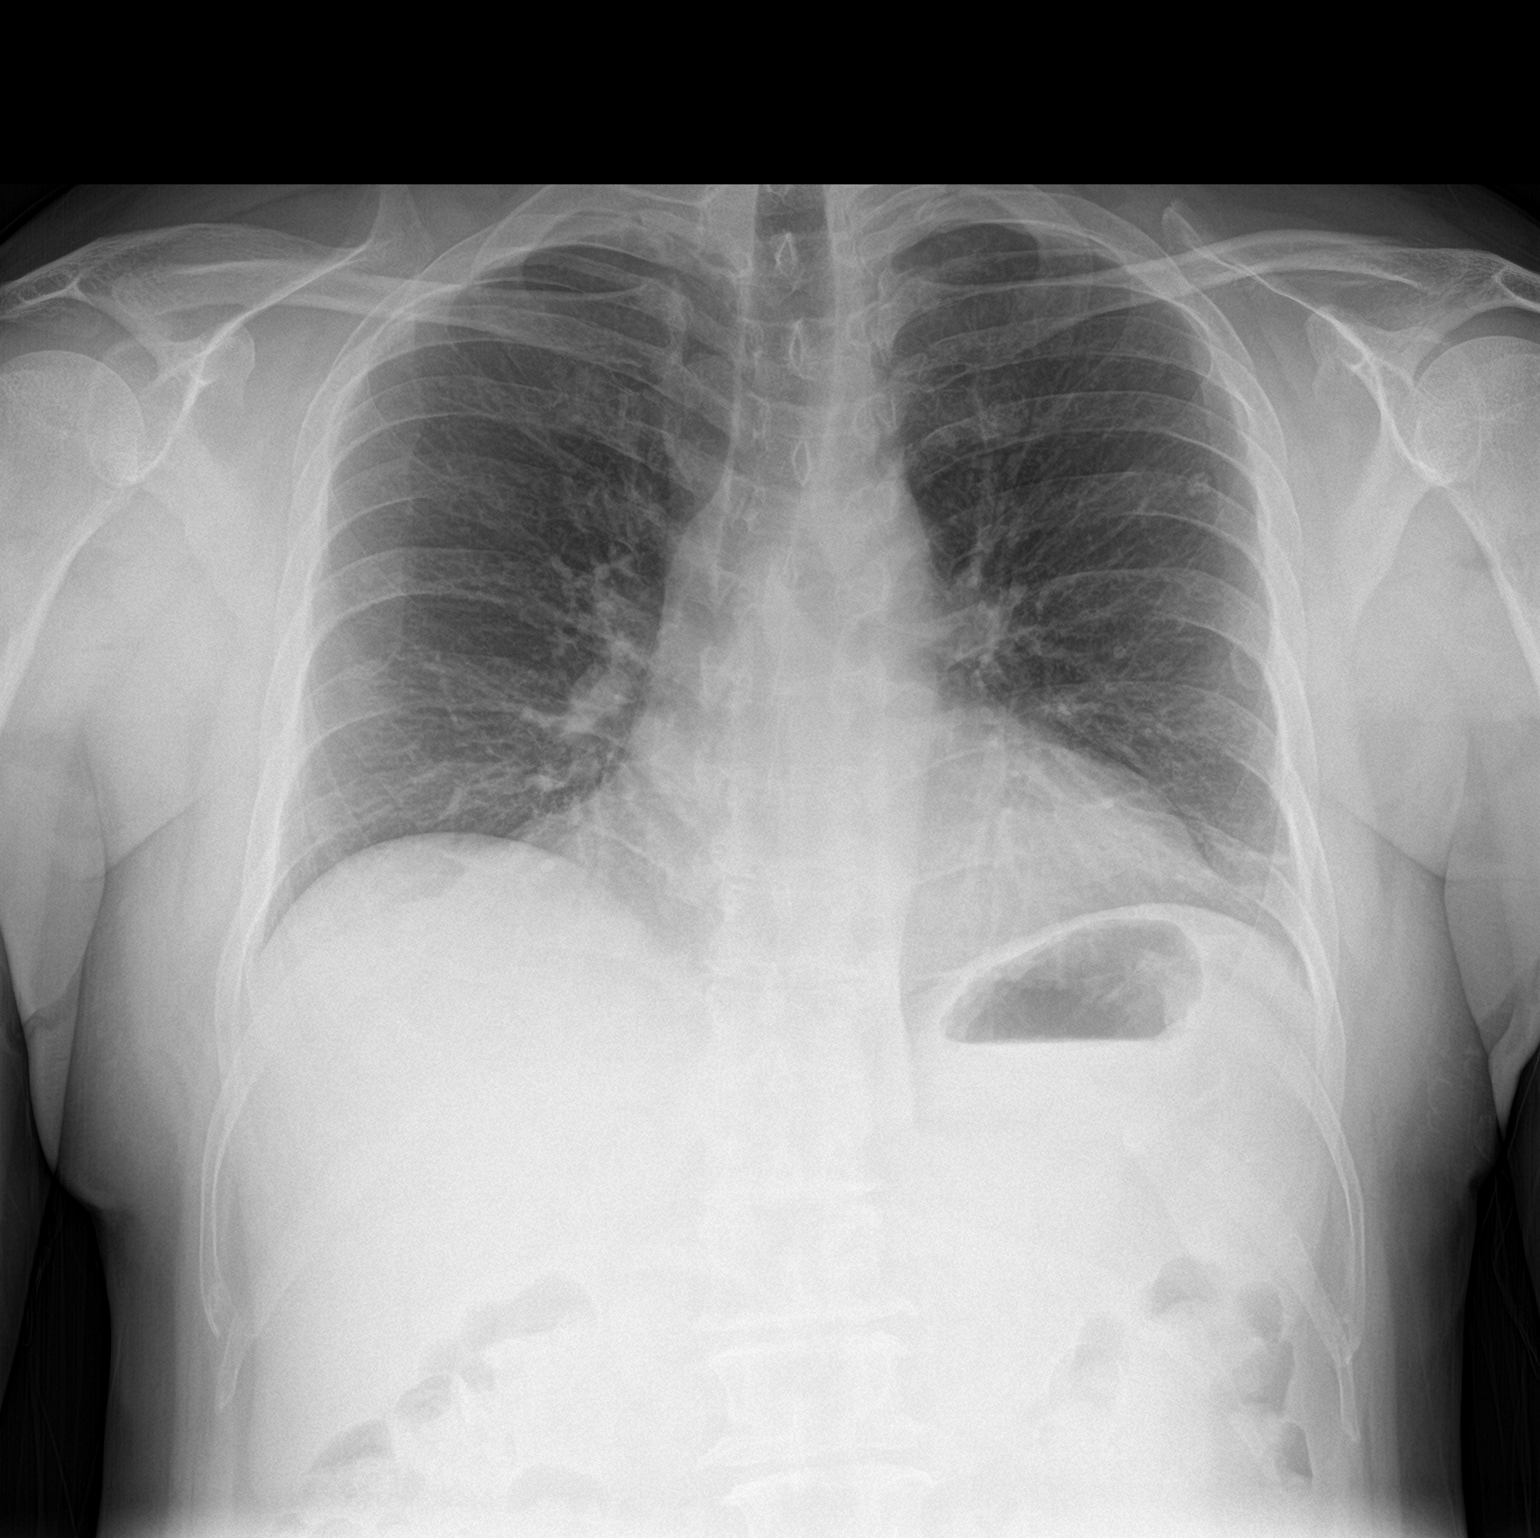

[chest lat]
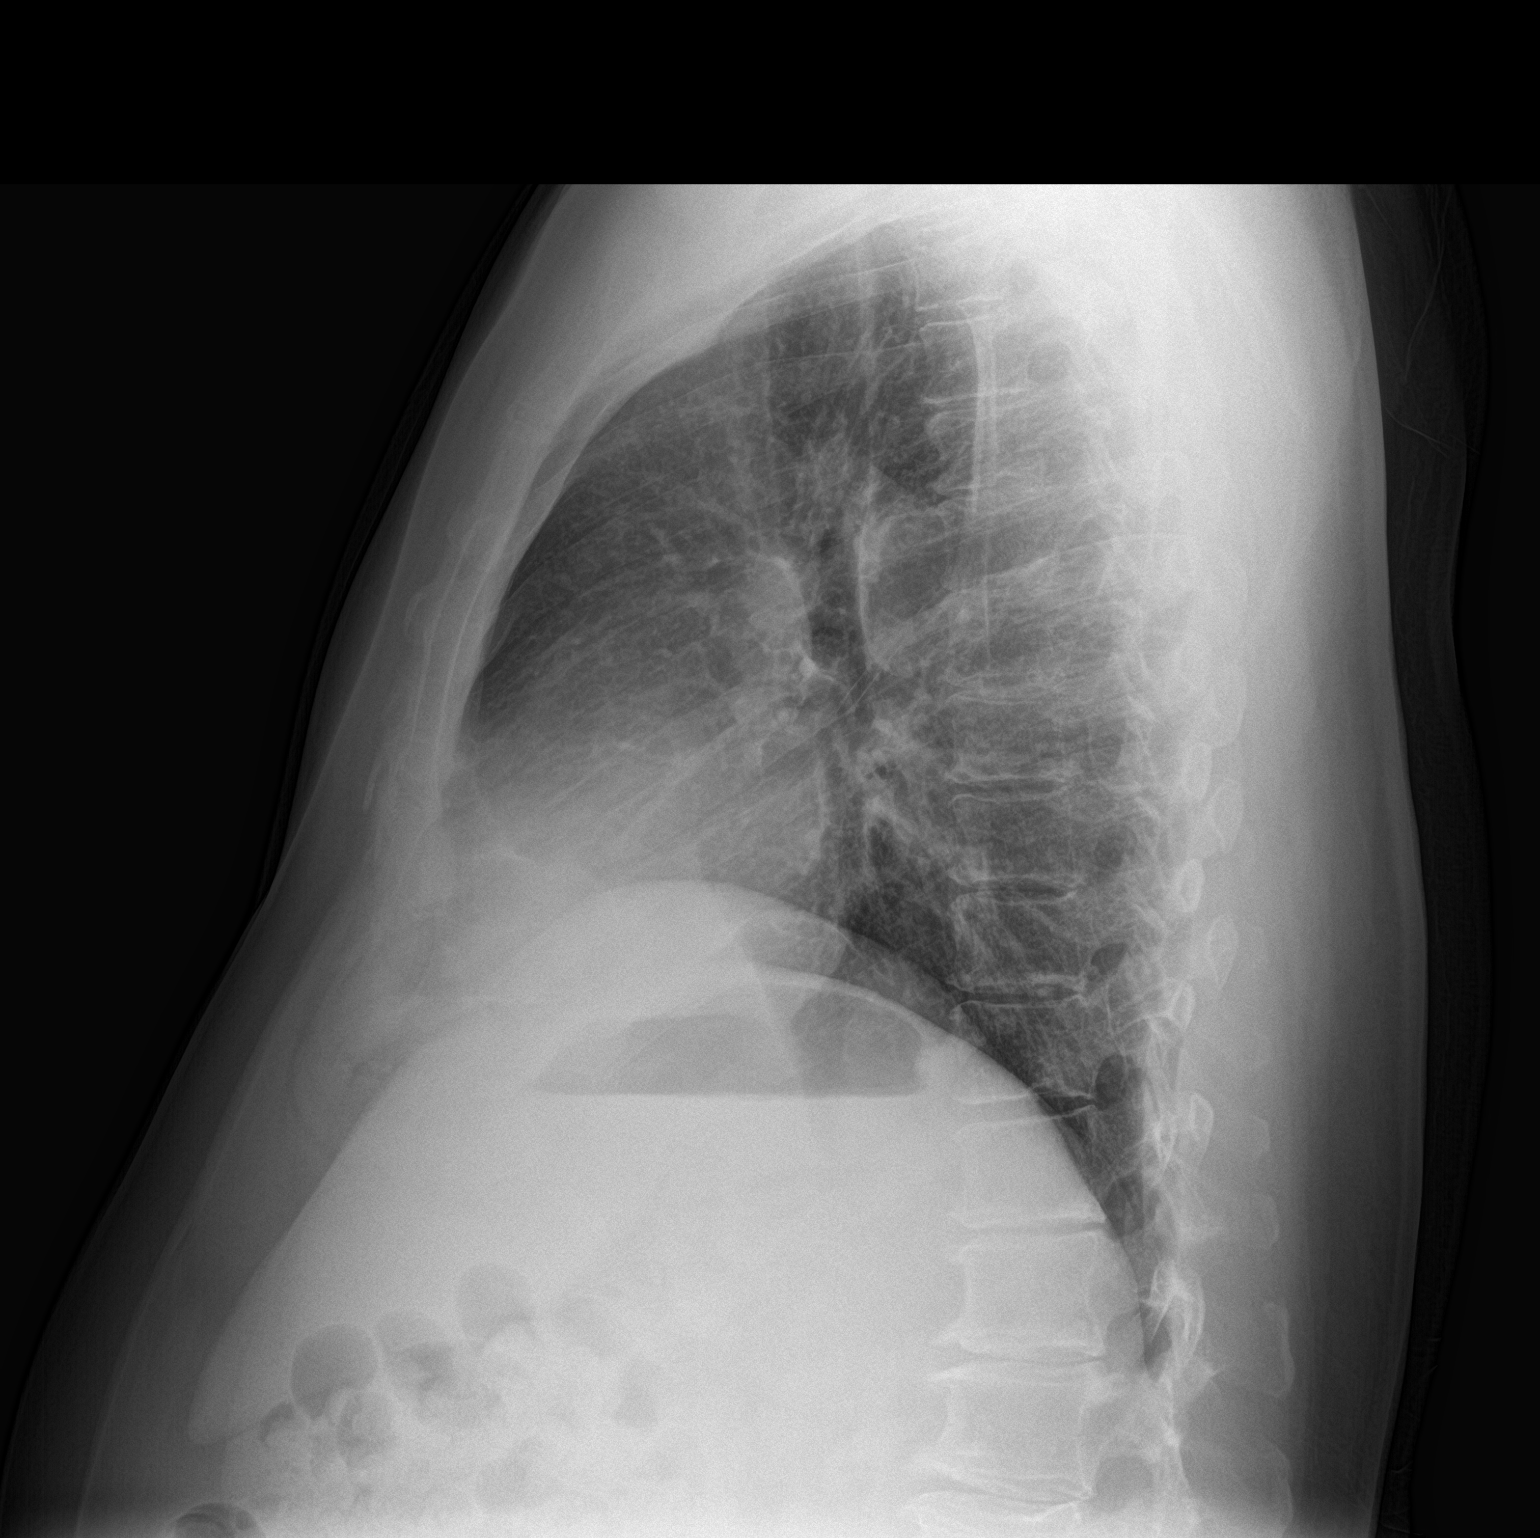

[2 of 2 positions shown; findings below may reference images not displayed]

FINDINGS: Low volumes. Bibasilar atelectasis. Calcified left upper lobe nodule
is a granuloma. Normal heart size. No pneumothorax.
IMPRESSION: Bibasilar atelectasis.

## 2017-01-28 ENCOUNTER — Other Ambulatory Visit: Payer: Self-pay | Admitting: Family Medicine

## 2017-04-13 ENCOUNTER — Other Ambulatory Visit: Payer: Self-pay | Admitting: Family Medicine

## 2017-04-13 DIAGNOSIS — K219 Gastro-esophageal reflux disease without esophagitis: Secondary | ICD-10-CM

## 2017-04-26 ENCOUNTER — Other Ambulatory Visit: Payer: Self-pay | Admitting: Family Medicine

## 2017-04-26 DIAGNOSIS — I10 Essential (primary) hypertension: Secondary | ICD-10-CM

## 2017-05-20 ENCOUNTER — Other Ambulatory Visit: Payer: Self-pay | Admitting: *Deleted

## 2017-05-20 MED ORDER — COLCHICINE 0.6 MG PO TABS: 0.6000 mg | ORAL_TABLET | Freq: Every day | ORAL | 1 refills | Status: DC

## 2017-08-01 ENCOUNTER — Other Ambulatory Visit: Payer: Self-pay | Admitting: Family Medicine

## 2017-08-01 DIAGNOSIS — I10 Essential (primary) hypertension: Secondary | ICD-10-CM

## 2017-08-14 ENCOUNTER — Other Ambulatory Visit: Payer: Self-pay | Admitting: Family Medicine

## 2017-08-14 DIAGNOSIS — K219 Gastro-esophageal reflux disease without esophagitis: Secondary | ICD-10-CM

## 2017-08-22 ENCOUNTER — Encounter: Payer: Self-pay | Admitting: Emergency Medicine

## 2017-08-22 ENCOUNTER — Emergency Department
Admission: EM | Admit: 2017-08-22 | Discharge: 2017-08-22 | Disposition: A | Discharge status: Home / Self Care | Payer: Commercial Managed Care - PPO | Source: Home / Self Care | Attending: Family Medicine | Admitting: Family Medicine

## 2017-08-22 DIAGNOSIS — J069 Acute upper respiratory infection, unspecified: Secondary | ICD-10-CM | POA: Diagnosis not present

## 2017-08-22 DIAGNOSIS — Z8701 Personal history of pneumonia (recurrent): Secondary | ICD-10-CM

## 2017-08-22 DIAGNOSIS — J040 Acute laryngitis: Secondary | ICD-10-CM

## 2017-08-22 LAB — POCT RAPID STREP A (OFFICE): Rapid Strep A Screen: NEGATIVE

## 2017-08-22 MED ORDER — BENZONATATE 100 MG PO CAPS: 100.0000 mg | ORAL_CAPSULE | Freq: Three times a day (TID) | ORAL | 0 refills | Status: DC

## 2017-08-22 MED ORDER — AZITHROMYCIN 250 MG PO TABS: 250.0000 mg | ORAL_TABLET | Freq: Every day | ORAL | 0 refills | Status: DC

## 2017-08-22 MED ORDER — PREDNISONE 20 MG PO TABS
ORAL_TABLET | ORAL | 0 refills | Status: DC
Start: 2017-08-22 — End: 2017-12-16

## 2017-08-22 MED ORDER — ALBUTEROL SULFATE HFA 108 (90 BASE) MCG/ACT IN AERS: 1.0000 | INHALATION_SPRAY | Freq: Four times a day (QID) | RESPIRATORY_TRACT | 0 refills | Status: DC | PRN

## 2017-08-22 NOTE — Discharge Instructions (Signed)
°  You may take 500mg acetaminophen every 4-6 hours or in combination with ibuprofen 400-600mg every 6-8 hours as needed for pain, inflammation, and fever. ° °Be sure to drink at least eight 8oz glasses of water to stay well hydrated and get at least 8 hours of sleep at night, preferably more while sick.  ° °Please take antibiotics as prescribed and be sure to complete entire course even if you start to feel better to ensure infection does not come back. ° °

## 2017-08-22 NOTE — ED Triage Notes (Signed)
Patient complaining of sore throat x 4 days, fever, non-productive cough, HA, loss of voice, been taking Advil and Mucinex.

## 2017-08-22 NOTE — ED Provider Notes (Signed)
[facility]    CSN: [id number] Arrival date & time: 08/22/17  1310     History   Chief Complaint Chief Complaint  Patient presents with  . Sore Throat    HPI Damon Conrad is a 51 y.o. male.   HPI Damon Conrad is a 51 y.o. male presenting to UC with c/o sore throat for 4 days with associated non-productive cough, generalized headache and loss of voice.  He has taken Advil and Mucinex with mild relief.  Last does was last night. No known fever. No n/v/d. Hx of pneumonia 4 times in the past. He has had prednisone and an inhaler in the past but does not currently have an inhaler.    Past Medical History:  Diagnosis Date  . GERD (gastroesophageal reflux disease)   . HTN (hypertension), benign     Patient Active Problem List   Diagnosis Date Noted  . Tonsil stone 10/08/2016  . 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 Plantar fasciitis, right 06/15/2016  . Metatarsal stress fracture of left foot 04/21/2016  . Chronic gout 02/21/2016  . 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 Right lumbar radiculopathy 02/25/2015  . Hypertriglyceridemia 05/08/2014  . Essential hypertension, benign 05/07/2014  . GERD (gastroesophageal reflux disease) 05/07/2014  . Pain in the chest 05/07/2014    Past Surgical History:  Procedure Laterality Date  . ABDOMINAL HERNIA REPAIR Bilateral    x 2       Home Medications    Prior to Admission medications   Medication Sig Start Date End Date Taking? Authorizing Provider  albuterol (PROVENTIL HFA;VENTOLIN HFA) 108 (90 Base) MCG/ACT inhaler Inhale 1-2 puffs into the lungs every 6 (six) hours as needed for wheezing or shortness of breath. 08/22/17   Lurene Shadow, PA-C  aspirin 325 MG tablet Take 325 mg by mouth daily.    [provider]  azithromycin (ZITHROMAX) 250 MG tablet Take 1 tablet (250 mg total) by mouth daily. Take first 2 tablets together, then 1 every day until finished. 08/22/17   Lurene Shadow, PA-C  benzonatate  (TESSALON) 100 MG capsule Take 1-2 capsules (100-200 mg total) by mouth every 8 (eight) hours. 08/22/17   Lurene Shadow, PA-C  cefdinir (OMNICEF) 300 MG capsule Take 1 capsule (300 mg total) by mouth 2 (two) times daily. 01/12/17   Rodolph Bong, MD  colchicine (COLCRYS) 0.6 MG tablet Take 1 tablet (0.6 mg total) by mouth daily. 05/20/17   Rodolph Bong, MD  diclofenac sodium (VOLTAREN) 1 % GEL Apply 2 g topically 4 (four) times daily. To affected joint. 09/15/16   Rodolph Bong, MD  esomeprazole (NEXIUM) 20 MG capsule TAKE 1 CAPSULE BY MOUTH ONCE DAILY 08/16/17   Rodolph Bong, MD  lisinopril (PRINIVIL,ZESTRIL) 10 MG tablet Take 1 tablet (10 mg total) by mouth daily. 10/08/16   Rodolph Bong, MD  metoprolol succinate (TOPROL-XL) 100 MG 24 hr tablet TAKE 1 TABLET BY MOUTH ONCE DAILY 08/02/17   Rodolph Bong, MD  predniSONE (DELTASONE) 20 MG tablet 3 tabs po day one, then 2 po daily x 4 days 08/22/17   Lurene Shadow, PA-C    Family History Family History  Problem Relation Age of Onset  . Alcohol abuse Father   . Cancer Mother   . Hypertension Mother     Social History Social History  Substance Use Topics  . Smoking status: Never Smoker  . Smokeless tobacco: Never Used  . Alcohol use Yes  Comment: Rarely     Allergies   Meloxicam   Review of Systems Review of Systems  Constitutional: Negative for chills and fever.  HENT: Positive for congestion, sore throat and voice change. Negative for ear pain and trouble swallowing.   Respiratory: Negative for cough and shortness of breath.   Cardiovascular: Negative for chest pain and palpitations.  Gastrointestinal: Negative for abdominal pain, diarrhea, nausea and vomiting.  Musculoskeletal: Negative for arthralgias, back pain and myalgias.  Skin: Negative for rash.  Neurological: Positive for headaches. Negative for dizziness and light-headedness.     Physical Exam Triage Vital Signs ED Triage Vitals [08/22/17 1332]  Enc Vitals  Group     BP (!) 146/94     Pulse Rate 81     Resp      Temp 99.2 F (37.3 C)     Temp Source Oral     SpO2 96 %     Weight 203 lb 4 oz (92.2 kg)     Height 5\' 7"  (1.702 m)     Head Circumference      Peak Flow      Pain Score 0     Pain Loc      Pain Edu?      Excl. in GC?    No data found.   Updated Vital Signs BP (!) 146/94 (BP Location: Left Arm)   Pulse 81   Temp 99.2 F (37.3 C) (Oral)   Ht 5\' 7"  (1.702 m)   Wt 203 lb 4 oz (92.2 kg)   SpO2 96%   BMI 31.83 kg/m   Visual Acuity Right Eye Distance:   Left Eye Distance:   Bilateral Distance:    Right Eye Near:   Left Eye Near:    Bilateral Near:     Physical Exam  Constitutional: He is oriented to person, place, and time. He appears well-developed and well-nourished. No distress.  HENT:  Head: Normocephalic and atraumatic.  Right Ear: Tympanic membrane normal.  Left Ear: Tympanic membrane normal.  Nose: Mucosal edema present. Right sinus exhibits no maxillary sinus tenderness and no frontal sinus tenderness. Left sinus exhibits no maxillary sinus tenderness and no frontal sinus tenderness.  Mouth/Throat: Uvula is midline and mucous membranes are normal. Posterior oropharyngeal erythema present. No oropharyngeal exudate, posterior oropharyngeal edema or tonsillar abscesses.  Eyes: EOM are normal.  Neck: Normal range of motion.  Cardiovascular: Normal rate and regular rhythm.   Pulmonary/Chest: Effort normal. No respiratory distress. He has no wheezes. He has rhonchi (diffuse, clears with cough). He has no rales.  Musculoskeletal: Normal range of motion.  Neurological: He is alert and oriented to person, place, and time.  Skin: Skin is warm and dry. He is not diaphoretic.  Psychiatric: He has a normal mood and affect. His behavior is normal.  Nursing note and vitals reviewed.    UC Treatments / Results  Labs (all labs ordered are listed, but only abnormal results are displayed) Labs Reviewed  POCT RAPID  STREP A (OFFICE)    EKG  EKG Interpretation None       Radiology No results found.  Procedures Procedures (including critical care time)  Medications Ordered in UC Medications - No data to display   Initial Impression / Assessment and Plan / UC Course  I have reviewed the triage vital signs and the nursing notes.  Pertinent labs & imaging results that were available during my care of the patient were reviewed by me and considered in my  medical decision making (see chart for details).     Symptoms likely viral, however, due to reports of 4 episodes of pneumonia in the past, will cover for atypical bacteria Home care instructions provided F/u with PCP in 1 week if needed.   Final Clinical Impressions(s) / UC Diagnoses   Final diagnoses:  Upper respiratory tract infection, unspecified type  Laryngitis  History of pneumonia    New Prescriptions Discharge Medication List as of 08/22/2017  1:42 PM    START taking these medications   Details  albuterol (PROVENTIL HFA;VENTOLIN HFA) 108 (90 Base) MCG/ACT inhaler Inhale 1-2 puffs into the lungs every 6 (six) hours as needed for wheezing or shortness of breath., Starting Sun 08/22/2017, Normal    azithromycin (ZITHROMAX) 250 MG tablet Take 1 tablet (250 mg total) by mouth daily. Take first 2 tablets together, then 1 every day until finished., Starting Sun 08/22/2017, Normal    benzonatate (TESSALON) 100 MG capsule Take 1-2 capsules (100-200 mg total) by mouth every 8 (eight) hours., Starting Sun 08/22/2017, Normal    predniSONE (DELTASONE) 20 MG tablet 3 tabs po day one, then 2 po daily x 4 days, Normal         Controlled Substance Prescriptions [place] Controlled Substance Registry consulted? Not Applicable   Rolla Plate 08/22/17 1354

## 2017-09-16 ENCOUNTER — Telehealth: Payer: Self-pay | Admitting: Family Medicine

## 2017-09-16 NOTE — Telephone Encounter (Signed)
Left VM for Pt to return clinic call, he will need an appointment.

## 2017-09-16 NOTE — Telephone Encounter (Signed)
Pt called. [facility] still [facility] wants to know if rx can be called in, he has already tried Robitussin.  Thanks.

## 2017-09-17 NOTE — Telephone Encounter (Signed)
Okay we will be expecting his call.

## 2017-10-30 ENCOUNTER — Other Ambulatory Visit: Payer: Self-pay | Admitting: Family Medicine

## 2017-10-30 DIAGNOSIS — I10 Essential (primary) hypertension: Secondary | ICD-10-CM

## 2017-12-01 ENCOUNTER — Telehealth: Payer: Self-pay | Admitting: Family Medicine

## 2017-12-01 NOTE — Telephone Encounter (Signed)
Received fax from [employer] and they denied coverage on Esomeprazole 20 mg due to it is not on the listing or formulary of approved drugs for patient's benefit plan - CF

## 2017-12-02 NOTE — Telephone Encounter (Signed)
Ask patient if he wants to try Omeprazole or pantraporazole

## 2017-12-03 NOTE — Telephone Encounter (Signed)
I called and left a voicemail for patient to call me back to see which medication he prefers. - CF

## 2017-12-10 ENCOUNTER — Telehealth: Payer: Self-pay | Admitting: Family Medicine

## 2017-12-10 NOTE — Telephone Encounter (Signed)
We can wait till follow up

## 2017-12-10 NOTE — Telephone Encounter (Signed)
Pt called and states he is iver due for a f/u on BP and gout so he scheduled for Monday 2/11 and he wants to know if he needs to get labs done? Call patient and let him know

## 2017-12-13 ENCOUNTER — Ambulatory Visit: Payer: Commercial Managed Care - PPO | Admitting: Family Medicine

## 2017-12-16 ENCOUNTER — Ambulatory Visit: Payer: Commercial Managed Care - PPO | Admitting: Family Medicine

## 2017-12-16 ENCOUNTER — Encounter: Payer: Self-pay | Admitting: Family Medicine

## 2017-12-16 VITALS — BP 143/89 | HR 59 | Ht 67.0 in | Wt 197.0 lb

## 2017-12-16 DIAGNOSIS — K2100 Gastro-esophageal reflux disease with esophagitis, without bleeding: Secondary | ICD-10-CM

## 2017-12-16 DIAGNOSIS — Z6830 Body mass index (BMI) 30.0-30.9, adult: Secondary | ICD-10-CM | POA: Diagnosis not present

## 2017-12-16 DIAGNOSIS — E781 Pure hyperglyceridemia: Secondary | ICD-10-CM | POA: Diagnosis not present

## 2017-12-16 DIAGNOSIS — I10 Essential (primary) hypertension: Secondary | ICD-10-CM | POA: Diagnosis not present

## 2017-12-16 DIAGNOSIS — Z6831 Body mass index (BMI) 31.0-31.9, adult: Secondary | ICD-10-CM | POA: Insufficient documentation

## 2017-12-16 DIAGNOSIS — R079 Chest pain, unspecified: Secondary | ICD-10-CM

## 2017-12-16 DIAGNOSIS — M1A9XX0 Chronic gout, unspecified, without tophus (tophi): Secondary | ICD-10-CM | POA: Diagnosis not present

## 2017-12-16 MED ORDER — LISINOPRIL 10 MG PO TABS: 10.0000 mg | ORAL_TABLET | Freq: Every day | ORAL | 0 refills | Status: DC

## 2017-12-16 MED ORDER — METOPROLOL SUCCINATE ER 100 MG PO TB24: 100.0000 mg | ORAL_TABLET | Freq: Every day | ORAL | 0 refills | Status: DC

## 2017-12-16 MED ORDER — OMEPRAZOLE 40 MG PO CPDR: 40.0000 mg | DELAYED_RELEASE_CAPSULE | Freq: Every day | ORAL | 4 refills | Status: DC

## 2017-12-16 MED ORDER — COLCHICINE 0.6 MG PO TABS: 0.6000 mg | ORAL_TABLET | Freq: Every day | ORAL | 1 refills | Status: DC

## 2017-12-16 MED ORDER — PREDNISONE 50 MG PO TABS: 50.0000 mg | ORAL_TABLET | Freq: Every day | ORAL | 1 refills | Status: DC | PRN

## 2017-12-16 MED ORDER — FEBUXOSTAT 80 MG PO TABS: 80.0000 mg | ORAL_TABLET | Freq: Every day | ORAL | 1 refills | Status: DC

## 2017-12-16 NOTE — Progress Notes (Signed)
Damon Conrad is a 52 y.o. male who presents to [name]: Primary [facility] Medicine today for HTN, Gout, GERD, Chest Pain, and Colon cancer screening.   HTN: Damon Conrad take metoprolol xl 100mg  daily. He stopped taking the lisinopril 10 daily a while ago. He feels well with no lightheadedness or SOB or palpitations. He denies any near syncope. He does have occasional chest pains discussed below. He denies any exertion problems.  He notes his blood pressure at home is typically higher than the 130 systolic range.  Gout: Damon Conrad has not done well with gout recently. He has frequent attacks especially in the left great toe.  He notes that he continues to take colchicine daily.  He will occasionally also use leftover prednisone which seems to help.  Finds gout flares to be obnoxious and interfering with his quality of life.  Chest pain: Damon Conrad is experiencing occasional sharp stabbing upper left back chest pain.  He has had episodes of chest pain in the past as well.  He had an evaluation with a treadmill stress test in 2015 was normal.  He notes these symptoms are not particularly exertional.  He denies severe shortness of breath with them.  He recently had a coworker die from a sudden cardiac death at work and is worried about his heart risk.   GERD: Damon Conrad has bothersome daily acid reflux symptoms.  He previously was well controlled with Nexium 20 mg daily.  His insurance stopped covering it and he has been using over-the-counter omeprazole 20 mg which is mildly helpful.  Without any PPI he has very bothersome reflux symptoms.   Colon cancer screening: Damon Conrad is interested in Cologuard for colon cancer screening.   Past Medical History:  Diagnosis Date  . GERD (gastroesophageal reflux disease)   . HTN (hypertension), benign    Past Surgical History:  Procedure Laterality Date  . ABDOMINAL HERNIA REPAIR  Bilateral    x 2   Social History   Tobacco Use  . Smoking status: Never Smoker  . Smokeless tobacco: Never Used  Substance Use Topics  . Alcohol use: Yes    Comment: Rarely   family history includes Alcohol abuse in his father; Cancer in his mother; Hypertension in his mother.  ROS as above:  Medications: Current Outpatient Medications  Medication Sig Dispense Refill  . albuterol (PROVENTIL HFA;VENTOLIN HFA) 108 (90 Base) MCG/ACT inhaler Inhale 1-2 puffs into the lungs every 6 (six) hours as needed for wheezing or shortness of breath. 1 Inhaler 0  . aspirin 325 MG tablet Take 325 mg by mouth daily.    . colchicine (COLCRYS) 0.6 MG tablet Take 1 tablet (0.6 mg total) by mouth daily. 90 tablet 1  . diclofenac sodium (VOLTAREN) 1 % GEL Apply 2 g topically 4 (four) times daily. To affected joint. 100 g 11  . lisinopril (PRINIVIL,ZESTRIL) 10 MG tablet Take 1 tablet (10 mg total) by mouth daily. 90 tablet 0  . metoprolol succinate (TOPROL-XL) 100 MG 24 hr tablet Take 1 tablet (100 mg total) by mouth daily. 90 tablet 0  . Febuxostat (ULORIC) 80 MG TABS Take 1 tablet (80 mg total) by mouth daily. 90 tablet 1  . omeprazole (PRILOSEC) 40 MG capsule Take 1 capsule (40 mg total) by mouth daily. 90 capsule 4  . predniSONE (DELTASONE) 50 MG tablet Take 1 tablet (50 mg total) by mouth daily as needed (gout flair). 30 tablet 1   No current facility-administered medications  for this visit.    Allergies  Allergen Reactions  . Meloxicam     rash    Health Maintenance Health Maintenance  Topic Date Due  . Fecal DNA (Cologuard)  06/20/2014  . TETANUS/TDAP  09/20/2026 (Originally 06/21/1983)  . INFLUENZA VACCINE  07/03/2028 (Originally 06/02/2017)  . Hepatitis C Screening  Completed  . HIV Screening  Completed     Exam:  BP (!) 143/89   Pulse (!) 59   Ht 5\' 7"  (1.702 m)   Wt 197 lb (89.4 kg)   BMI 30.85 kg/m  Gen: Well NAD HEENT: EOMI,  MMM Lungs: Normal work of breathing.  CTABL Heart: RRR no MRG Abd: NABS, Soft. Nondistended, Nontender Exts: Brisk capillary refill, warm and well perfused.   Twelve-lead EKG shows sinus bradycardia at 49 bpm.  No ST segment elevation or depression.  Aside from bradycardia normal EKG without significant change from prior EKG in 2014.   No results found for this or any previous visit (from the past 72 hour(s)). No results found.    Assessment and Plan: 52 y.o. male with  Hypertension: Not at goal.  Patient seems to be tolerating metoprolol quite well with no episodes of syncope or near syncope despite mild bradycardia seen today on EKG.  I think it is reasonable to restart the lisinopril and recheck in a few months.  We will check metabolic panel today as well for routine monitoring.   Gout: Not well controlled.  Last uric acid was 9 a year ago.  Damon Conrad was lost to follow-up and never started on uric acid lowering medication.  We will correct that issue today by starting you [place] and if that is not covered allopurinol.  Continue colchicine prophylaxis and use prednisone sparingly for severe flares.  Plan to recheck in 1-3 months.  Goal is for 0 gout flares with uric acid less than 6.  Acid reflux: Not well controlled.  Plan to prescribe omeprazole 40 mg daily which should be equivalent to Nexium 20 daily.  Recheck in a few months.  Chest pain: Unclear etiology.  Symptoms atypical.  His EKG is largely normal-appearing today.  Will control blood pressure a bit better and recheck in a few months.  If his symptoms continue at that point is probably reasonable to proceed with cardiac referral for further stress testing.  We discussed precautions and when to go to the emergency room.  We will check fasting labs and hemoglobin A1c to follow-up his obesity and hyperlipidemia seen previously.  Colon Cancer screening: After discussion plan for Cologuard.  Patient declined Tdap and flu vaccines today. Orders Placed This Encounter   Procedures  . CBC  . COMPLETE METABOLIC PANEL WITH GFR  . Lipid Panel w/reflex Direct LDL  . Uric acid  . Hemoglobin A1c  . EKG 12-Lead   Meds ordered this encounter  Medications  . Febuxostat (ULORIC) 80 MG TABS    Sig: Take 1 tablet (80 mg total) by mouth daily.    Dispense:  90 tablet    Refill:  1  . lisinopril (PRINIVIL,ZESTRIL) 10 MG tablet    Sig: Take 1 tablet (10 mg total) by mouth daily.    Dispense:  90 tablet    Refill:  0  . metoprolol succinate (TOPROL-XL) 100 MG 24 hr tablet    Sig: Take 1 tablet (100 mg total) by mouth daily.    Dispense:  90 tablet    Refill:  0  . omeprazole (PRILOSEC) 40 MG capsule  Sig: Take 1 capsule (40 mg total) by mouth daily.    Dispense:  90 capsule    Refill:  4  . predniSONE (DELTASONE) 50 MG tablet    Sig: Take 1 tablet (50 mg total) by mouth daily as needed (gout flair).    Dispense:  30 tablet    Refill:  1  . colchicine (COLCRYS) 0.6 MG tablet    Sig: Take 1 tablet (0.6 mg total) by mouth daily.    Dispense:  90 tablet    Refill:  1     Discussed warning signs or symptoms. Please see discharge instructions. Patient expresses understanding.

## 2017-12-16 NOTE — Patient Instructions (Addendum)
Thank you for coming in today. High Blood Pressure: Continue metoprolol 100. Restart Lisinopril 10 Recheck in 1-3 months.  Gout: Start Uloric daily. Let me know if this is not covered. We will switch to a different medicine.  Use prednisone sparingly as needed for gout flairs.  Recheck in 1-3 months.   Chest Pain: EKG looked normal.  Recheck in 1-3 months. If not better we will consider a stress test.   Febuxostat oral tablets What is this medicine? FEBUXOSTAT (feb UX oh stat) is used to treat gout. People with gout have too much uric acid in their body. This medicine works to lower how much uric acid the body makes. This medicine may be used for other purposes; ask your health care provider or pharmacist if you have questions. COMMON BRAND NAME(S): Uloric What should I tell my health care provider before I take this medicine? They need to know if you have any of these conditions: -heart disease -history of stroke -kidney disease -liver disease - taking theophylline, azathioprine, or mercaptopurine - an unusual or allergic reaction to febuxostat, other medicines, foods, dyes, or preservatives - pregnant or trying to get pregnant - breast feeding How should I use this medicine? Take this medicine by mouth with a glass of water. Follow the directions on the prescription label. You can take it with or without food. Take your medicine at regular intervals. Do not take it more often than directed. Do not stop taking except on your doctor's advice. Talk to your pediatrician regarding the use of this medicine in children. This medicine is not approved for use in children. Overdosage: If you think you have taken too much of this medicine contact a poison control center or emergency room at once. NOTE: This medicine is only for you. Do not share this medicine with others. What if I miss a dose? If you miss a dose, take it as soon as you can. If it is almost time for your next dose, take only  that dose. Do not take double or extra doses. What may interact with this medicine? Do not take this medicine with any of the following medications: -azathioprine -mercaptopurine -theophylline This medicine may also interact with the following medications: -certain medicines used to treat gout -medicines used to treat cancer -rasburicase This list may not describe all possible interactions. Give your health care provider a list of all the medicines, herbs, non-prescription drugs, or dietary supplements you use. Also tell them if you smoke, drink alcohol, or use illegal drugs. Some items may interact with your medicine. What should I watch for while using this medicine? Visit your doctor or health care professional for regular checks on your progress. You will need regular blood tests while you are taking this medicine. Your gout may flare up while you are taking this medicine. If you have gout pain, do not stop taking this medicine. You may need to take another medicine with this medicine for gout pain. What side effects may I notice from receiving this medicine? Side effects that you should report to your doctor or health care professional as soon as possible: -allergic reactions like skin rash, itching or hives, swelling of the face, lips, or tongue -breathing problems -changes in vision -chest pain -confusion, trouble speaking or understanding -dizziness -fast, irregular heartbeat -feeling faint or lightheaded, falls -gout pain -muscle aches or pains -severe headaches -sudden numbness or weakness of the face, arm or leg -trouble walking, dizziness, loss of balance or coordination -unusually weak or  tired -yellowing of the eyes or skin Side effects that usually do not require medical attention (report to your doctor or health care professional if they continue or are bothersome): -changes in appetite -constipation or diarrhea -nausea -red, hot flush to face or skin -stomach upset  or pain This list may not describe all possible side effects. Call your doctor for medical advice about side effects. You may report side effects to FDA at 1-800-FDA-1088. Where should I keep my medicine? Keep out of the reach of children. Store at room temperature between 15 and 30 degrees C (59 and 86 degrees F). Protect from light and moisture. Throw away any unused medicine after the expiration date. NOTE: This sheet is a summary. It may not cover all possible information. If you have questions about this medicine, talk to your doctor, pharmacist, or health care provider.  2018 Elsevier/Gold Standard (2015-11-20 16:32:58)

## 2017-12-17 ENCOUNTER — Other Ambulatory Visit: Payer: Self-pay | Admitting: Family Medicine

## 2017-12-17 LAB — HEMOGLOBIN A1C
Hgb A1c MFr Bld: 5.8 % of total Hgb — ABNORMAL HIGH (ref <5.7)
Mean Plasma Glucose: 120 (calc)
eAG (mmol/L): 6.6 (calc)

## 2017-12-17 LAB — LIPID PANEL W/REFLEX DIRECT LDL
Cholesterol: 177 mg/dL (ref <200)
HDL: 30 mg/dL — ABNORMAL LOW (ref >40)
LDL Cholesterol (Calc): 116 mg/dL (calc) — ABNORMAL HIGH
Non-HDL Cholesterol (Calc): 147 mg/dL (calc) — ABNORMAL HIGH (ref <130)
Total CHOL/HDL Ratio: 5.9 (calc) — ABNORMAL HIGH (ref <5.0)
Triglycerides: 194 mg/dL — ABNORMAL HIGH (ref <150)

## 2017-12-17 LAB — COMPLETE METABOLIC PANEL WITH GFR
AG RATIO: 1.4 (calc) (ref 1.0–2.5)
ALKALINE PHOSPHATASE (APISO): 71 U/L (ref 40–115)
ALT: 24 U/L (ref 9–46)
AST: 22 U/L (ref 10–35)
Albumin: 4 g/dL (ref 3.6–5.1)
BUN: 15 mg/dL (ref 7–25)
CO2: 29 mmol/L (ref 20–32)
CREATININE: 1.27 mg/dL (ref 0.70–1.33)
Calcium: 9.1 mg/dL (ref 8.6–10.3)
Chloride: 102 mmol/L (ref 98–110)
GFR, Est African American: 74 mL/min/{1.73_m2} (ref >=60)
GFR, Est Non African American: 64 mL/min/{1.73_m2} (ref >=60)
GLOBULIN: 2.8 g/dL (ref 1.9–3.7)
Glucose, Bld: 103 mg/dL — ABNORMAL HIGH (ref 65–99)
POTASSIUM: 4.5 mmol/L (ref 3.5–5.3)
SODIUM: 139 mmol/L (ref 135–146)
Total Bilirubin: 0.6 mg/dL (ref 0.2–1.2)
Total Protein: 6.8 g/dL (ref 6.1–8.1)

## 2017-12-17 LAB — CBC
HEMATOCRIT: 42.7 % (ref 38.5–50.0)
Hemoglobin: 14.7 g/dL (ref 13.2–17.1)
MCH: 29.9 pg (ref 27.0–33.0)
MCHC: 34.4 g/dL (ref 32.0–36.0)
MCV: 86.8 fL (ref 80.0–100.0)
MPV: 11.4 fL (ref 7.5–12.5)
PLATELETS: 185 10*3/uL (ref 140–400)
RBC: 4.92 10*6/uL (ref 4.20–5.80)
RDW: 12.8 % (ref 11.0–15.0)
WBC: 7 10*3/uL (ref 3.8–10.8)

## 2017-12-17 LAB — URIC ACID: Uric Acid, Serum: 9.5 mg/dL — ABNORMAL HIGH (ref 4.0–8.0)

## 2017-12-17 MED ORDER — ATORVASTATIN CALCIUM 20 MG PO TABS: 20.0000 mg | ORAL_TABLET | Freq: Every day | ORAL | 0 refills | Status: DC

## 2018-02-10 ENCOUNTER — Telehealth: Payer: Self-pay

## 2018-02-10 MED ORDER — AZITHROMYCIN 250 MG PO TABS: 250.0000 mg | ORAL_TABLET | Freq: Every day | ORAL | 0 refills | Status: DC

## 2018-02-10 NOTE — Telephone Encounter (Signed)
Spoke to patient gave him advise as noted below. Ranferi Clingan,CMA  

## 2018-02-10 NOTE — Telephone Encounter (Signed)
[facility] to [facility].  Will check on 4/15 and change if not better

## 2018-02-10 NOTE — Telephone Encounter (Signed)
Patient called requested a Rx for Zpack for a sinus infection. Please advise. Rhonda Cunningham,CMA

## 2018-02-14 ENCOUNTER — Ambulatory Visit: Payer: Commercial Managed Care - PPO | Admitting: Family Medicine

## 2018-02-14 ENCOUNTER — Encounter: Payer: Self-pay | Admitting: Family Medicine

## 2018-02-14 VITALS — BP 149/80 | HR 84 | Temp 98.0°F | Ht 67.0 in | Wt 201.0 lb

## 2018-02-14 DIAGNOSIS — M109 Gout, unspecified: Secondary | ICD-10-CM

## 2018-02-14 DIAGNOSIS — E782 Mixed hyperlipidemia: Secondary | ICD-10-CM | POA: Diagnosis not present

## 2018-02-14 DIAGNOSIS — M1A9XX0 Chronic gout, unspecified, without tophus (tophi): Secondary | ICD-10-CM | POA: Diagnosis not present

## 2018-02-14 DIAGNOSIS — I10 Essential (primary) hypertension: Secondary | ICD-10-CM | POA: Diagnosis not present

## 2018-02-14 DIAGNOSIS — J0101 Acute recurrent maxillary sinusitis: Secondary | ICD-10-CM

## 2018-02-14 MED ORDER — CEFDINIR 300 MG PO CAPS: 300.0000 mg | ORAL_CAPSULE | Freq: Two times a day (BID) | ORAL | 0 refills | Status: DC

## 2018-02-14 MED ORDER — GUAIFENESIN-CODEINE 100-10 MG/5ML PO SOLN: 5.0000 mL | Freq: Four times a day (QID) | ORAL | 0 refills | Status: DC | PRN

## 2018-02-14 MED ORDER — LISINOPRIL 20 MG PO TABS: 20.0000 mg | ORAL_TABLET | Freq: Every day | ORAL | 1 refills | Status: DC

## 2018-02-14 NOTE — Progress Notes (Signed)
Damon Conrad is a 52 y.o. male who [facility] today for follow-up hypertension gout and hyperlipidemia.  Hypertension: [name] takes lisinopril 10 and metoprolol XL 100 daily.  He denies chest pain palpitations shortness of breath or significant lightheadedness or dizziness.  Gout: [name] takes Uloric and colchicine.  He notes occasionally he feels pain and will use a leftover prednisone which helps.  He feels pretty well no fevers or chills.  Hyperlipidemia: [name] takes atorvastatin 20 mg daily with no significant muscle aches or pains.  Sinusitis: [name] notes sinus pain and pressure associated with hoarse voice and congestion.  He was prescribed azithromycin and notes that helped but not resolved his symptoms.  He notes bothersome nighttime cough.  No fevers or chills vomiting or diarrhea.   Past Medical History:  Diagnosis Date  . GERD (gastroesophageal reflux disease)   . HTN (hypertension), benign    Past Surgical History:  Procedure Laterality Date  . ABDOMINAL HERNIA REPAIR Bilateral    x 2   Social History   Tobacco Use  . Smoking status: Never Smoker  . Smokeless tobacco: Never Used  Substance Use Topics  . Alcohol use: Yes    Comment: Rarely   family history includes Alcohol abuse in his father; Cancer in his mother; Hypertension in his mother.  ROS as above:  Medications: Current Outpatient Medications  Medication Sig Dispense Refill  . albuterol (PROVENTIL HFA;VENTOLIN HFA) 108 (90 Base) MCG/ACT inhaler Inhale 1-2 puffs into the lungs every 6 (six) hours as needed for wheezing or shortness of breath. 1 Inhaler 0  . aspirin 325 MG tablet Take 325 mg by mouth daily.    Marland Kitchen. atorvastatin (LIPITOR) 20 MG tablet Take 1 tablet (20 mg total) by mouth daily. 90 tablet 0  . azithromycin (ZITHROMAX) 250 MG tablet Take 1 tablet (250 mg total) by  mouth daily. Take first 2 tablets together, then 1 every day until finished. 6 tablet 0  . cefdinir (OMNICEF) 300 MG capsule Take 1 capsule (300 mg total) by mouth 2 (two) times daily. 14 capsule 0  . colchicine (COLCRYS) 0.6 MG tablet Take 1 tablet (0.6 mg total) by mouth daily. 90 tablet 1  . diclofenac sodium (VOLTAREN) 1 % GEL Apply 2 g topically 4 (four) times daily. To affected joint. 100 g 11  . Febuxostat (ULORIC) 80 MG TABS Take 1 tablet (80 mg total) by mouth daily. 90 tablet 1  . guaiFENesin-codeine 100-10 MG/5ML syrup Take 5 mLs by mouth every 6 (six) hours as needed for cough. 120 mL 0  . lisinopril (PRINIVIL,ZESTRIL) 20 MG tablet Take 1 tablet (20 mg total) by mouth daily. 90 tablet 1  . metoprolol succinate (TOPROL-XL) 100 MG 24 hr tablet Take 1 tablet (100 mg total) by mouth daily. 90 tablet 0  . omeprazole (PRILOSEC) 40 MG capsule Take 1 capsule (40 mg total) by mouth daily. 90 capsule 4  . predniSONE (DELTASONE) 50 MG tablet Take 1 tablet (50 mg total) by mouth daily as needed (gout flair). 30 tablet 1   No current facility-administered medications for this visit.    Allergies  Allergen Reactions  . Meloxicam     rash    Health Maintenance Health Maintenance  Topic Date Due  . Fecal DNA (Cologuard)  06/20/2014  . TETANUS/TDAP  09/20/2026 (Originally 06/21/1983)  . INFLUENZA VACCINE  07/03/2028 (Originally 06/02/2018)  . Hepatitis C Screening  Completed  . HIV  Screening  Completed     Exam:  BP (!) 149/80   Pulse 84   Temp 98 F (36.7 C) (Oral)   Ht 5\' 7"  (1.702 m)   Wt 201 lb (91.2 kg)   BMI 31.48 kg/m  Gen: Well NAD HEENT: EOMI,  MMM posterior pharynx with cobblestoning.  Inflamed nasal turbinates.  Tender to palpation right maxillary sinus.  No significant cervical lymphadenopathy. Lungs: Normal work of breathing. CTABL Heart: RRR no MRG Abd: NABS, Soft. Nondistended, Nontender Exts: Brisk capillary refill, warm and well perfused.  Left foot slightly  swollen area just proximal to the first MTP.  First MTP is not particularly tender with normal motion.  Foot is nontender with normal pulses capillary refill and sensation.   No results found for this or any previous visit (from the past 72 hour(s)). No results found.    Assessment and Plan: 53 y.o. male with  Hypertension: Blood pressure not at goal increase lisinopril to 20 mg and check metabolic panel in about a week.  Gout: Still having some symptoms.  Check uric acid continue current regimen  Hyperlipidemia: Check metabolic panel and lipid panel in the near future  Sinusitis.  Still symptomatic.  Plan for Omnicef and codeine-containing cough syrup and recheck in the near future.  Check back in 2 months   Orders Placed This Encounter  Procedures  . CBC  . COMPLETE METABOLIC PANEL WITH GFR  . Lipid Panel w/reflex Direct LDL  . Uric acid   Meds ordered this encounter  Medications  . lisinopril (PRINIVIL,ZESTRIL) 20 MG tablet    Sig: Take 1 tablet (20 mg total) by mouth daily.    Dispense:  90 tablet    Refill:  1  . cefdinir (OMNICEF) 300 MG capsule    Sig: Take 1 capsule (300 mg total) by mouth 2 (two) times daily.    Dispense:  14 capsule    Refill:  0  . guaiFENesin-codeine 100-10 MG/5ML syrup    Sig: Take 5 mLs by mouth every 6 (six) hours as needed for cough.    Dispense:  120 mL    Refill:  0     Discussed warning signs or symptoms. Please see discharge instructions. Patient expresses understanding.

## 2018-02-14 NOTE — Patient Instructions (Signed)
Thank you for coming in today. Increase the lisinopril to 20mg  Continue current medicines.  Use the codeine cough medicine at bedtime.  Do not drive on this medicine.  Take omnicef antibiotic.  Recheck with me in 2 months.  Return sooner if needed.   Get fasting labs in about 1 week.

## 2018-03-11 ENCOUNTER — Other Ambulatory Visit: Payer: Self-pay | Admitting: Family Medicine

## 2018-03-11 DIAGNOSIS — I10 Essential (primary) hypertension: Secondary | ICD-10-CM

## 2018-03-16 LAB — LIPID PANEL W/REFLEX DIRECT LDL
CHOL/HDL RATIO: 5.1 (calc) — AB (ref <5.0)
CHOLESTEROL: 174 mg/dL (ref <200)
HDL: 34 mg/dL — AB (ref >40)
LDL Cholesterol (Calc): 116 mg/dL (calc) — ABNORMAL HIGH
Non-HDL Cholesterol (Calc): 140 mg/dL (calc) — ABNORMAL HIGH (ref <130)
TRIGLYCERIDES: 129 mg/dL (ref <150)

## 2018-03-16 LAB — CBC
HCT: 39.2 % (ref 38.5–50.0)
Hemoglobin: 13.5 g/dL (ref 13.2–17.1)
MCH: 29.7 pg (ref 27.0–33.0)
MCHC: 34.4 g/dL (ref 32.0–36.0)
MCV: 86.2 fL (ref 80.0–100.0)
MPV: 11.5 fL (ref 7.5–12.5)
Platelets: 176 10*3/uL (ref 140–400)
RBC: 4.55 10*6/uL (ref 4.20–5.80)
RDW: 13 % (ref 11.0–15.0)
WBC: 6.9 10*3/uL (ref 3.8–10.8)

## 2018-03-16 LAB — COMPLETE METABOLIC PANEL WITH GFR
AG Ratio: 1.4 (calc) (ref 1.0–2.5)
ALBUMIN MSPROF: 3.8 g/dL (ref 3.6–5.1)
ALKALINE PHOSPHATASE (APISO): 76 U/L (ref 40–115)
ALT: 18 U/L (ref 9–46)
AST: 18 U/L (ref 10–35)
BILIRUBIN TOTAL: 0.6 mg/dL (ref 0.2–1.2)
BUN: 11 mg/dL (ref 7–25)
CO2: 29 mmol/L (ref 20–32)
CREATININE: 1.15 mg/dL (ref 0.70–1.33)
Calcium: 9.1 mg/dL (ref 8.6–10.3)
Chloride: 104 mmol/L (ref 98–110)
GFR, Est African American: 84 mL/min/{1.73_m2} (ref >=60)
GFR, Est Non African American: 72 mL/min/{1.73_m2} (ref >=60)
GLOBULIN: 2.7 g/dL (ref 1.9–3.7)
Glucose, Bld: 95 mg/dL (ref 65–99)
Potassium: 4.7 mmol/L (ref 3.5–5.3)
SODIUM: 139 mmol/L (ref 135–146)
Total Protein: 6.5 g/dL (ref 6.1–8.1)

## 2018-03-16 LAB — URIC ACID: Uric Acid, Serum: 7.5 mg/dL (ref 4.0–8.0)

## 2018-03-16 MED ORDER — ATORVASTATIN CALCIUM 40 MG PO TABS: 40.0000 mg | ORAL_TABLET | Freq: Every day | ORAL | 1 refills | Status: DC

## 2018-03-16 NOTE — Addendum Note (Signed)
Addended by: Rodolph Bong on: 03/16/2018 06:04 AM   Modules accepted: Orders

## 2018-04-18 ENCOUNTER — Ambulatory Visit: Payer: Commercial Managed Care - PPO | Admitting: Family Medicine

## 2018-05-03 ENCOUNTER — Encounter: Payer: Self-pay | Admitting: Family Medicine

## 2018-05-03 ENCOUNTER — Ambulatory Visit (INDEPENDENT_AMBULATORY_CARE_PROVIDER_SITE_OTHER): Payer: Commercial Managed Care - PPO | Admitting: Family Medicine

## 2018-05-03 VITALS — BP 135/83 | HR 63 | Ht 67.0 in | Wt 198.0 lb

## 2018-05-03 DIAGNOSIS — I10 Essential (primary) hypertension: Secondary | ICD-10-CM

## 2018-05-03 DIAGNOSIS — R252 Cramp and spasm: Secondary | ICD-10-CM | POA: Diagnosis not present

## 2018-05-03 DIAGNOSIS — Z6831 Body mass index (BMI) 31.0-31.9, adult: Secondary | ICD-10-CM

## 2018-05-03 DIAGNOSIS — E782 Mixed hyperlipidemia: Secondary | ICD-10-CM | POA: Diagnosis not present

## 2018-05-03 DIAGNOSIS — M1A9XX0 Chronic gout, unspecified, without tophus (tophi): Secondary | ICD-10-CM

## 2018-05-03 MED ORDER — LISINOPRIL 20 MG PO TABS: 20.0000 mg | ORAL_TABLET | Freq: Every day | ORAL | 1 refills | Status: DC

## 2018-05-03 MED ORDER — BACLOFEN 10 MG PO TABS: 10.0000 mg | ORAL_TABLET | Freq: Every evening | ORAL | 3 refills | Status: DC | PRN

## 2018-05-03 NOTE — Patient Instructions (Addendum)
Thank you for coming in today. If uloric is expensive when refilled let me know and we will switch to Allopurinol.   Think about Tdap vaccine.   Recheck with me in 6 months for physical or recheck   Get fasting cholesterol soon.    For cramps. Try over the counter magnesium supplements.  Take baclofen at bedtime as needed.   Let me know if not doing well.    Muscle Cramps and Spasms Muscle cramps and spasms occur when a muscle or muscles tighten and you have no control over this tightening (involuntary muscle contraction). They are a common problem and can develop in any muscle. The most common place is in the calf muscles of the leg. Muscle cramps and muscle spasms are both involuntary muscle contractions, but there are some differences between the two:  Muscle cramps are painful. They come and go and may last a few seconds to 15 minutes. Muscle cramps are often more forceful and last longer than muscle spasms.  Muscle spasms may or may not be painful. They may also last just a few seconds or much longer.  Certain medical conditions, such as diabetes or Parkinson disease, can make it more likely to develop cramps or spasms. However, cramps or spasms are usually not caused by a serious underlying problem. Common causes include:  Overexertion.  Overuse from repetitive motions, or doing the same thing over and over.  Remaining in a certain position for a long period of time.  Improper preparation, form, or technique while playing a sport or doing an activity.  Dehydration.  Injury.  Side effects of some medicines.  Abnormally low levels of the salts and ions in your blood (electrolytes), especially potassium and calcium. This could happen if you are taking water pills (diuretics) or if you are pregnant.  In many cases, the cause of muscle cramps or spasms is unknown. Follow these instructions at home:  Stay well hydrated. Drink enough fluid to keep your urine clear or pale  yellow.  Try massaging, stretching, and relaxing the affected muscle.  If directed, apply heat to tight or tense muscles as often as told by your health care provider. Use the heat source that your health care provider recommends, such as a moist heat pack or a heating pad. ? Place a towel between your skin and the heat source. ? Leave the heat on for 20-30 minutes. ? Remove the heat if your skin turns bright red. This is especially important if you are unable to feel pain, heat, or cold. You may have a greater risk of getting burned.  If directed, put ice on the affected area. This may help if you are sore or have pain after a cramp or spasm. ? Put ice in a plastic bag. ? Place a towel between your skin and the bag. ? Leavethe ice on for 20 minutes, 2-3 times a day.  Take over-the-counter and prescription medicines only as told by your health care provider.  Pay attention to any changes in your symptoms. Contact a health care provider if:  Your cramps or spasms get more severe or happen more often.  Your cramps or spasms do not improve over time. This information is not intended to replace advice given to you by your health care provider. Make sure you discuss any questions you have with your health care provider. Document Released: 04/10/2002 Document Revised: 11/20/2015 Document Reviewed: 07/23/2015 Elsevier Interactive Patient Education  2018 [employer]

## 2018-05-03 NOTE — Progress Notes (Signed)
Damon Conrad is a 52 y.o. male who presents to [facility]: Primary Care [facility] Medicine today for hypertension, hyperlipidemia, gout, cramping.  Damon Conrad has hypertension managed with blood pressure medications as below.  He denies chest pain palpitations shortness of breath lightheadedness or dizziness.  Tolerates medication well with no issues.  Additionally Damon Conrad has a history of hyperlipidemia.  He takes atorvastatin 40 mg daily.  This was increased relatively recently.  He tolerates it well with no significant severe muscle aches or pains.  Gout: Damon Conrad has a history of gout currently managed with Uloric 80 mg daily.  He notes very intermittent gout flares which she will use occasional prednisone and colchicine which works quite well.  Cramping: Damon Conrad notes intermittent muscle cramping especially at night.  He cannot think of any particular cause.  This is been ongoing now for months.  He has not tried any treatment yet.  He feels well otherwise.   ROS as above:  Exam:  BP 135/83   Pulse 63   Ht 5\' 7"  (1.702 m)   Wt 198 lb (89.8 kg)   BMI 31.01 kg/m  Gen: Well NAD HEENT: EOMI,  MMM Lungs: Normal work of breathing. CTABL Heart: RRR no MRG Abd: NABS, Soft. Nondistended, Nontender Exts: Brisk capillary refill, warm and well perfused.  Pulses are intact extremities.   Lab and Radiology Results   Chemistry      Component Value Date/Time   NA 139 03/15/2018 1415   NA 141 05/26/2013 1152   K 4.7 03/15/2018 1415   CL 104 03/15/2018 1415   CO2 29 03/15/2018 1415   BUN 11 03/15/2018 1415   CREATININE 1.15 03/15/2018 1415   GLU 103 05/26/2013 1152      Component Value Date/Time   CALCIUM 9.1 03/15/2018 1415   ALKPHOS 64 09/15/2016 1150   AST 18 03/15/2018 1415   ALT 18 03/15/2018 1415   BILITOT 0.6 03/15/2018 1415     Lab Results  Component Value Date   CHOL 174 03/15/2018   HDL  34 (L) 03/15/2018   LDLCALC 116 (H) 03/15/2018   TRIG 129 03/15/2018   CHOLHDL 5.1 (H) 03/15/2018   Lab Results  Component Value Date   LABURIC 7.5 03/15/2018      Assessment and Plan: 52 y.o. male with  Hypertension doing well continue lisinopril.  Work on weight loss as well as this will help.  Hyperlipidemia: LDL goal less than 100.  Recheck lipid panel in the near future.    Gout: Doing well however Uloric will soon be more expensive.  May have to switch to allopurinol for cost.  Patient will let me know what he wants to do.  Cramping: Unclear etiology.  Plan for trial of baclofen at bedtime.  Electrolytes normal on recent check.  Tdap discussed. Pt thinking about out.   Pt will send in cologuard test.   Orders Placed This Encounter  Procedures  . Lipid Panel w/reflex Direct LDL  . COMPLETE METABOLIC PANEL WITH GFR   Meds ordered this encounter  Medications  . lisinopril (PRINIVIL,ZESTRIL) 20 MG tablet    Sig: Take 1 tablet (20 mg total) by mouth daily.    Dispense:  90 tablet    Refill:  1  . baclofen (LIORESAL) 10 MG tablet    Sig: Take 1 tablet (10 mg total) by mouth at bedtime as needed for muscle spasms.    Dispense:  90 each    Refill:  3     Historical information moved to improve visibility of documentation.  Past Medical History:  Diagnosis Date  . GERD (gastroesophageal reflux disease)   . HTN (hypertension), benign    Past Surgical History:  Procedure Laterality Date  . ABDOMINAL HERNIA REPAIR Bilateral    x 2   Social History   Tobacco Use  . Smoking status: Never Smoker  . Smokeless tobacco: Never Used  Substance Use Topics  . Alcohol use: Yes    Comment: Rarely   family history includes Alcohol abuse in his father; Cancer in his mother; Hypertension in his mother.  Medications: Current Outpatient Medications  Medication Sig Dispense Refill  . albuterol (PROVENTIL HFA;VENTOLIN HFA) 108 (90 Base) MCG/ACT inhaler Inhale 1-2 puffs  into the lungs every 6 (six) hours as needed for wheezing or shortness of breath. 1 Inhaler 0  . atorvastatin (LIPITOR) 40 MG tablet Take 1 tablet (40 mg total) by mouth daily. 90 tablet 1  . colchicine (COLCRYS) 0.6 MG tablet Take 1 tablet (0.6 mg total) by mouth daily. 90 tablet 1  . diclofenac sodium (VOLTAREN) 1 % GEL Apply 2 g topically 4 (four) times daily. To affected joint. 100 g 11  . Febuxostat (ULORIC) 80 MG TABS Take 1 tablet (80 mg total) by mouth daily. 90 tablet 1  . lisinopril (PRINIVIL,ZESTRIL) 20 MG tablet Take 1 tablet (20 mg total) by mouth daily. 90 tablet 1  . metoprolol succinate (TOPROL-XL) 100 MG 24 hr tablet TAKE 1 TABLET BY MOUTH ONCE DAILY 90 tablet 0  . omeprazole (PRILOSEC) 40 MG capsule Take 1 capsule (40 mg total) by mouth daily. 90 capsule 4  . predniSONE (DELTASONE) 50 MG tablet Take 1 tablet (50 mg total) by mouth daily as needed (gout flair). 30 tablet 1  . baclofen (LIORESAL) 10 MG tablet Take 1 tablet (10 mg total) by mouth at bedtime as needed for muscle spasms. 90 each 3   No current facility-administered medications for this visit.    Allergies  Allergen Reactions  . Meloxicam     rash     Discussed warning signs or symptoms. Please see discharge instructions. Patient expresses understanding.

## 2018-06-10 ENCOUNTER — Other Ambulatory Visit: Payer: Self-pay | Admitting: Family Medicine

## 2018-06-10 DIAGNOSIS — I10 Essential (primary) hypertension: Secondary | ICD-10-CM

## 2018-06-21 ENCOUNTER — Other Ambulatory Visit: Payer: Self-pay | Admitting: Family Medicine

## 2018-06-21 LAB — COMPLETE METABOLIC PANEL WITH GFR
AG RATIO: 1.4 (calc) (ref 1.0–2.5)
ALKALINE PHOSPHATASE (APISO): 71 U/L (ref 40–115)
ALT: 17 U/L (ref 9–46)
AST: 16 U/L (ref 10–35)
Albumin: 4 g/dL (ref 3.6–5.1)
BUN: 12 mg/dL (ref 7–25)
CALCIUM: 9.3 mg/dL (ref 8.6–10.3)
CHLORIDE: 104 mmol/L (ref 98–110)
CO2: 31 mmol/L (ref 20–32)
Creat: 1.15 mg/dL (ref 0.70–1.33)
GFR, EST NON AFRICAN AMERICAN: 72 mL/min/{1.73_m2} (ref >=60)
GFR, Est African American: 83 mL/min/{1.73_m2} (ref >=60)
GLOBULIN: 2.9 g/dL (ref 1.9–3.7)
Glucose, Bld: 98 mg/dL (ref 65–99)
Potassium: 4.7 mmol/L (ref 3.5–5.3)
SODIUM: 141 mmol/L (ref 135–146)
Total Bilirubin: 0.5 mg/dL (ref 0.2–1.2)
Total Protein: 6.9 g/dL (ref 6.1–8.1)

## 2018-06-21 LAB — LIPID PANEL W/REFLEX DIRECT LDL
CHOL/HDL RATIO: 5 (calc) — AB (ref <5.0)
CHOLESTEROL: 169 mg/dL (ref <200)
HDL: 34 mg/dL — AB (ref >40)
LDL CHOLESTEROL (CALC): 113 mg/dL — AB
NON-HDL CHOLESTEROL (CALC): 135 mg/dL — AB (ref <130)
TRIGLYCERIDES: 114 mg/dL (ref <150)

## 2018-06-21 MED ORDER — ATORVASTATIN CALCIUM 80 MG PO TABS: 80.0000 mg | ORAL_TABLET | Freq: Every day | ORAL | 1 refills | Status: DC

## 2018-10-20 ENCOUNTER — Encounter: Payer: Self-pay | Admitting: Family Medicine

## 2018-10-20 ENCOUNTER — Ambulatory Visit (INDEPENDENT_AMBULATORY_CARE_PROVIDER_SITE_OTHER): Payer: Commercial Managed Care - PPO | Admitting: Family Medicine

## 2018-10-20 VITALS — BP 145/92 | HR 59 | Ht 67.0 in | Wt 200.0 lb

## 2018-10-20 DIAGNOSIS — I10 Essential (primary) hypertension: Secondary | ICD-10-CM

## 2018-10-20 DIAGNOSIS — J0101 Acute recurrent maxillary sinusitis: Secondary | ICD-10-CM

## 2018-10-20 DIAGNOSIS — R079 Chest pain, unspecified: Secondary | ICD-10-CM | POA: Diagnosis not present

## 2018-10-20 DIAGNOSIS — E782 Mixed hyperlipidemia: Secondary | ICD-10-CM

## 2018-10-20 DIAGNOSIS — M1A9XX0 Chronic gout, unspecified, without tophus (tophi): Secondary | ICD-10-CM

## 2018-10-20 MED ORDER — ALLOPURINOL 100 MG PO TABS: 100.0000 mg | ORAL_TABLET | Freq: Every day | ORAL | 1 refills | Status: DC

## 2018-10-20 MED ORDER — LISINOPRIL 30 MG PO TABS: 30.0000 mg | ORAL_TABLET | Freq: Every day | ORAL | 1 refills | Status: DC

## 2018-10-20 MED ORDER — ATORVASTATIN CALCIUM 80 MG PO TABS: 40.0000 mg | ORAL_TABLET | Freq: Every day | ORAL | 1 refills | Status: DC

## 2018-10-20 MED ORDER — AZITHROMYCIN 250 MG PO TABS: 250.0000 mg | ORAL_TABLET | Freq: Every day | ORAL | 0 refills | Status: DC

## 2018-10-20 NOTE — Progress Notes (Signed)
Damon Conrad is a 52 y.o. male who [facility] today for follow-up hypertension, gout, hyperlipidemia, chest pain/palpitations..  Hypertension: Damon Conrad takes lisinopril 20 mg daily, and metoprolol 100 mg daily.  He tolerates these medications quite well and denies significant lightheadedness episodes.  As noted below he does have episodes occasionally of palpitations but feels well otherwise.  He measures his blood pressure at home and notes that it can be as high as 180 systolic he notes that he works second shift and typically takes his medication in the midday prior to attending work.  Damon Conrad has a history of gout.  He is currently prescribed Uloric and colchicine and occasional prednisone.  He notes that he has not been taking his Uloric regularly.  He will take it with colchicine occasionally when he has a bad gout flare.  He typically will take a short course of prednisone for an episode of gout flare.  He notes that he typically does not have frequent episodes of gout.  Additionally he has a history of hyperlipidemia.  He is currently prescribed atorvastatin 80 mg daily.  However he is only been taking it every few days at a fear of developing myalgias.  He never has actually had myalgia however his friends have and is a bit worried about that.  He notes he has been taking his atorvastatin very infrequently and inconsistently for some time now.  Chest pain/palpitations: Damon Conrad has a history of very infrequent or intermittent episodes of chest pain associated with occasional palpitations.  He denies significant exertional component or shortness of breath.  He had some work-up for this already with a relatively normal EKG in February.  He notes his episodes will occasionally bother him and he is a bit worried that he may have something more serious going on  Sinus pain  and pressure: Damon Conrad notes nasal discharge sinus pain and pressure ongoing for about 2 weeks.  Symptoms are consistent with previous episodes of sinusitis.  He is tried over-the-counter medications which have helped a bit.  No fevers or chills.   ROS as above:  Exam:  BP (!) 145/92   Pulse (!) 59   Ht 5\' 7"  (1.702 m)   Wt 200 lb (90.7 kg)   BMI 31.32 kg/m  Wt Readings from Last 5 Encounters:  10/20/18 200 lb (90.7 kg)  05/03/18 198 lb (89.8 kg)  02/14/18 201 lb (91.2 kg)  12/16/17 197 lb (89.4 kg)  08/22/17 203 lb 4 oz (92.2 kg)    Gen: Well NAD HEENT: EOMI,  MMM inflamed nasal turbinates bilaterally.  Tender palpation maxillary sinus.  Posterior pharynx mildly erythematous with left-sided tonsil stone. Lungs: Normal work of breathing. CTABL Heart: RRR no MRG Abd: NABS, Soft. Nondistended, Nontender Exts: Brisk capillary refill, warm and well perfused.   Lab and Radiology Results   Chemistry      Component Value Date/Time   NA 141 06/20/2018 1414   NA 141 05/26/2013 1152   K 4.7 06/20/2018 1414   CL 104 06/20/2018 1414   CO2 31 06/20/2018 1414   BUN 12 06/20/2018 1414   CREATININE 1.15 06/20/2018 1414   GLU 103 05/26/2013 1152      Component Value Date/Time   CALCIUM 9.3 06/20/2018 1414   ALKPHOS 64 09/15/2016 1150   AST 16 06/20/2018 1414   ALT 17 06/20/2018 1414   BILITOT 0.5 06/20/2018 1414     Lab Results  Component Value Date   CHOL 169 06/20/2018   HDL 34 (L) 06/20/2018   LDLCALC 113 (H) 06/20/2018   TRIG 114 06/20/2018   CHOLHDL 5.0 (H) 06/20/2018   Lab Results  Component Value Date   LABURIC 7.5 03/15/2018       Assessment and Plan: 52 y.o. male with  Hypertension: Blood pressure not well controlled.  Plan to increase lisinopril to 30 mg and continue Toprol.  Recommend taking at least 1 of his blood pressure medications at bedtime.  Tachypalpitations with occasional chest pain: Unclear etiology.  Patient likely would benefit from further  work-up including likely stress test and Holter monitor/event monitor.  I think at this point is reasonable to refer to cardiology.  I think he may benefit from stress echo or nuclear medicine stress test and therefore I like to have cardiology pack the most helpful test for them.  EKG recently was unremarkable.  He is asymptomatic today.  Work on blood pressure control as above.  Hyperlipidemia: I have been slowly increasing his statin dose and that is something that he was taking it regularly.  However I think it is not ideal control statins are more likely due to infrequent dosage.  Plan to after lengthy discussion decrease total statin dose to 40 mg of atorvastatin daily and take it more consistently.  As he is not had myalgias I think is unlikely that he will but again discussed that we can certainly adjust medications if he does develop them although they are unlikely.  Gout: Again Linn has not been taking his controlled medication regularly.  Plan to discontinue Lorick due to potential cost reasons as well as slight increased CVD risk with this medication.  We will switch to allopurinol and check labs in about 3 months.  Sinusitis: Patient has 2 weeks symptoms sinusitis failing to improve despite conservative management.  Tender palpation maxillary sinus.  Plan to treat with continued over-the-counter medication.  Use azithromycin if worsening.   Orders Placed This Encounter  Procedures  . Ambulatory referral to Cardiology    Referral Priority:   Routine    Referral Type:   Consultation    Referral Reason:   Specialty Services Required    Requested Specialty:   Cardiology    Number of Visits Requested:   1   Meds ordered this encounter  Medications  . lisinopril (PRINIVIL,ZESTRIL) 30 MG tablet    Sig: Take 1 tablet (30 mg total) by mouth daily.    Dispense:  90 tablet    Refill:  1  . allopurinol (ZYLOPRIM) 100 MG tablet    Sig: Take 1 tablet (100 mg total) by mouth daily. To prevent  gout    Dispense:  90 tablet    Refill:  1  . atorvastatin (LIPITOR) 80 MG tablet    Sig: Take 0.5 tablets (40 mg total) by mouth daily.    Dispense:  90 tablet    Refill:  1  . azithromycin (ZITHROMAX) 250 MG tablet    Sig: Take 1 tablet (250 mg total) by mouth daily. Take first 2 tablets together, then 1 every day until finished.    Dispense:  6 tablet    Refill:  0     Historical information moved to improve visibility of documentation.  Past Medical History:  Diagnosis Date  . GERD (gastroesophageal reflux disease)   . HTN (hypertension), benign    Past Surgical History:  Procedure Laterality Date  . ABDOMINAL HERNIA REPAIR Bilateral  x 2   Social History   Tobacco Use  . Smoking status: Never Smoker  . Smokeless tobacco: Never Used  Substance Use Topics  . Alcohol use: Yes    Comment: Rarely   family history includes Alcohol abuse in his father; Cancer in his mother; Hypertension in his mother.  Medications: Current Outpatient Medications  Medication Sig Dispense Refill  . albuterol (PROVENTIL HFA;VENTOLIN HFA) 108 (90 Base) MCG/ACT inhaler Inhale 1-2 puffs into the lungs every 6 (six) hours as needed for wheezing or shortness of breath. 1 Inhaler 0  . atorvastatin (LIPITOR) 80 MG tablet Take 0.5 tablets (40 mg total) by mouth daily. 90 tablet 1  . baclofen (LIORESAL) 10 MG tablet Take 1 tablet (10 mg total) by mouth at bedtime as needed for muscle spasms. 90 each 3  . colchicine (COLCRYS) 0.6 MG tablet Take 1 tablet (0.6 mg total) by mouth daily. 90 tablet 1  . diclofenac sodium (VOLTAREN) 1 % GEL Apply 2 g topically 4 (four) times daily. To affected joint. 100 g 11  . lisinopril (PRINIVIL,ZESTRIL) 30 MG tablet Take 1 tablet (30 mg total) by mouth daily. 90 tablet 1  . metoprolol succinate (TOPROL-XL) 100 MG 24 hr tablet TAKE 1 TABLET BY MOUTH ONCE DAILY 90 tablet 1  . omeprazole (PRILOSEC) 40 MG capsule Take 1 capsule (40 mg total) by mouth daily. 90 capsule 4    . predniSONE (DELTASONE) 50 MG tablet Take 1 tablet (50 mg total) by mouth daily as needed (gout flair). 30 tablet 1  . allopurinol (ZYLOPRIM) 100 MG tablet Take 1 tablet (100 mg total) by mouth daily. To prevent gout 90 tablet 1  . azithromycin (ZITHROMAX) 250 MG tablet Take 1 tablet (250 mg total) by mouth daily. Take first 2 tablets together, then 1 every day until finished. 6 tablet 0   No current facility-administered medications for this visit.    Allergies  Allergen Reactions  . Meloxicam     rash     Discussed warning signs or symptoms. Please see discharge instructions. Patient expresses understanding.

## 2018-10-20 NOTE — Patient Instructions (Addendum)
Thank you for coming in today. Call or go to the emergency room if you get worse, have trouble breathing, have chest pains, or palpitations.   STOP uloric START allopurinol dialy to prevent gout.   Decrease atorvistatin to 40mg  daily for cholesterol   Increase lisinopril to 30mg  daily (take at bedtime) for blood pressure.   Take azithromycin for sinus infection is not better or if worse.   Recheck in 3 months.   You should hear from the heart doctor soon.   Let me know if you have any issues.   For dry mouth use Biotene mouth wash.

## 2018-12-07 ENCOUNTER — Other Ambulatory Visit: Payer: Self-pay | Admitting: Family Medicine

## 2018-12-07 DIAGNOSIS — I10 Essential (primary) hypertension: Secondary | ICD-10-CM

## 2019-01-05 ENCOUNTER — Other Ambulatory Visit: Payer: Self-pay | Admitting: Family Medicine

## 2019-01-06 ENCOUNTER — Telehealth: Payer: Self-pay | Admitting: Family Medicine

## 2019-01-06 NOTE — Telephone Encounter (Signed)
Patient had called and stated that he was having a sinus infection and he has tried all of the over the counter such as Tylenol, Mucinex, and sprays with no relief. He was offered an e-visit and Urgent Care and refused. At the end of the conversation he stated " If my doctor is going to be like that then I will be like that also, so bye". PCP aware.

## 2019-01-08 ENCOUNTER — Emergency Department (INDEPENDENT_AMBULATORY_CARE_PROVIDER_SITE_OTHER)
Admission: EM | Admit: 2019-01-08 | Discharge: 2019-01-08 | Disposition: A | Discharge status: Home / Self Care | Payer: Commercial Managed Care - PPO | Source: Home / Self Care

## 2019-01-08 ENCOUNTER — Encounter: Payer: Self-pay | Admitting: Emergency Medicine

## 2019-01-08 DIAGNOSIS — J019 Acute sinusitis, unspecified: Secondary | ICD-10-CM

## 2019-01-08 DIAGNOSIS — J069 Acute upper respiratory infection, unspecified: Secondary | ICD-10-CM | POA: Diagnosis not present

## 2019-01-08 DIAGNOSIS — B9689 Other specified bacterial agents as the cause of diseases classified elsewhere: Secondary | ICD-10-CM

## 2019-01-08 MED ORDER — BENZONATATE 100 MG PO CAPS: 100.0000 mg | ORAL_CAPSULE | Freq: Three times a day (TID) | ORAL | 0 refills | Status: DC

## 2019-01-08 MED ORDER — AMOXICILLIN-POT CLAVULANATE 875-125 MG PO TABS
1.0000 | ORAL_TABLET | Freq: Two times a day (BID) | ORAL | 0 refills | Status: DC
Start: 2019-01-08 — End: 2019-01-20

## 2019-01-08 MED ORDER — IPRATROPIUM BROMIDE 0.06 % NA SOLN: 2.0000 | Freq: Four times a day (QID) | NASAL | 1 refills | Status: DC

## 2019-01-08 NOTE — ED Triage Notes (Signed)
Patient c/o having a cold, sore throat, productive cough x 1 week, sinus pressure, no ear pain, wife diagnosed with a sinus infection.

## 2019-01-08 NOTE — ED Provider Notes (Signed)
[facility]    CSN: [id number] Arrival date & time: 01/08/19  1624     History   Chief Complaint Chief Complaint  Patient presents with  . Cough    HPI Damon Conrad is a 53 y.o. male.   HPI Damon Conrad is a 53 y.o. male presenting to UC with c/o 1 week of worsening cough, congestion and sinus pain and pressure with thick yellow nasal discharge. He has tried mucinex and aleve-d w/o relief. His wife was dx with a sinus infection recently. Denies fever, chills, n/v/d. Cough does keep him up at night. No chest pain or SOB.   Past Medical History:  Diagnosis Date  . GERD (gastroesophageal reflux disease)   . HTN (hypertension), benign     Patient Active Problem List   Diagnosis Date Noted  . BMI 31.0-31.9,adult 12/16/2017  . 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 Plantar fasciitis, right 06/15/2016  . Chronic gout 02/21/2016  . Right lumbar radiculopathy 02/25/2015  . HLD (hyperlipidemia) 05/08/2014  . Essential hypertension, benign 05/07/2014  . GERD (gastroesophageal reflux disease) 05/07/2014    Past Surgical History:  Procedure Laterality Date  . ABDOMINAL HERNIA REPAIR Bilateral    x 2       Home Medications    Prior to Admission medications   Medication Sig Start Date End Date Taking? Authorizing Provider  allopurinol (ZYLOPRIM) 100 MG tablet Take 1 tablet (100 mg total) by mouth daily. To prevent gout 10/20/18   Rodolph Bong, MD  amoxicillin-clavulanate (AUGMENTIN) 875-125 MG tablet Take 1 tablet by mouth 2 (two) times daily. One po bid x 7 days 01/08/19   Lurene Shadow, PA-C  atorvastatin (LIPITOR) 80 MG tablet Take 0.5 tablets (40 mg total) by mouth daily. 10/20/18   Rodolph Bong, MD  azithromycin (ZITHROMAX) 250 MG tablet Take 1 tablet (250 mg total) by mouth daily. Take first 2 tablets together, then 1 every day until finished. 10/20/18   Rodolph Bong, MD  baclofen (LIORESAL) 10 MG tablet Take 1 tablet (10 mg total) by mouth at  bedtime as needed for muscle spasms. 05/03/18   Rodolph Bong, MD  benzonatate (TESSALON) 100 MG capsule Take 1-2 capsules (100-200 mg total) by mouth every 8 (eight) hours. 01/08/19   Lurene Shadow, PA-C  colchicine (COLCRYS) 0.6 MG tablet Take 1 tablet (0.6 mg total) by mouth daily. 12/16/17   Rodolph Bong, MD  diclofenac sodium (VOLTAREN) 1 % GEL Apply 2 g topically 4 (four) times daily. To affected joint. 09/15/16   Rodolph Bong, MD  ipratropium (ATROVENT) 0.06 % nasal spray Place 2 sprays into both nostrils 4 (four) times daily. 01/08/19   Lurene Shadow, PA-C  lisinopril (PRINIVIL,ZESTRIL) 30 MG tablet Take 1 tablet (30 mg total) by mouth daily. 10/20/18   Rodolph Bong, MD  metoprolol succinate (TOPROL-XL) 100 MG 24 hr tablet TAKE 1 TABLET BY MOUTH ONCE DAILY 12/08/18   Rodolph Bong, MD  omeprazole (PRILOSEC) 40 MG capsule Take 1 capsule (40 mg total) by mouth daily. 12/16/17   Rodolph Bong, MD  predniSONE (DELTASONE) 50 MG tablet Take 1 tablet (50 mg total) by mouth daily as needed (gout flair). 12/16/17   Rodolph Bong, MD    Family History Family History  Problem Relation Age of Onset  . Alcohol abuse Father   . Cancer Mother   . Hypertension Mother     Social History Social History  Tobacco Use  . Smoking status: Never Smoker  . Smokeless tobacco: Never Used  Substance Use Topics  . Alcohol use: Yes    Comment: Rarely  . Drug use: No     Allergies   Meloxicam   Review of Systems Review of Systems  Constitutional: Negative for chills and fever.  HENT: Positive for congestion, sinus pressure, sinus pain and sore throat. Negative for ear pain, trouble swallowing and voice change.   Respiratory: Positive for cough. Negative for shortness of breath.   Cardiovascular: Negative for chest pain and palpitations.  Gastrointestinal: Negative for abdominal pain, diarrhea, nausea and vomiting.  Musculoskeletal: Negative for arthralgias, back pain and myalgias.  Skin: Negative for  rash.  Neurological: Positive for headaches. Negative for dizziness and light-headedness.     Physical Exam Triage Vital Signs ED Triage Vitals  Enc Vitals Group     BP 01/08/19 1642 (!) 157/94     Pulse Rate 01/08/19 1642 98     Resp --      Temp 01/08/19 1642 98.3 F (36.8 C)     Temp Source 01/08/19 1642 Oral     SpO2 01/08/19 1642 98 %     Weight 01/08/19 1643 198 lb 8 oz (90 kg)     Height 01/08/19 1643 5\' 7"  (1.702 m)     Head Circumference --      Peak Flow --      Pain Score 01/08/19 1643 0     Pain Loc --      Pain Edu? --      Excl. in GC? --    No data found.  Updated Vital Signs BP (!) 157/94 (BP Location: Right Arm)   Pulse 98   Temp 98.3 F (36.8 C) (Oral)   Ht 5\' 7"  (1.702 m)   Wt 198 lb 8 oz (90 kg)   SpO2 98%   BMI 31.09 kg/m   Visual Acuity Right Eye Distance:   Left Eye Distance:   Bilateral Distance:    Right Eye Near:   Left Eye Near:    Bilateral Near:     Physical Exam Vitals signs and nursing note reviewed.  Constitutional:      Appearance: Normal appearance. He is well-developed.  HENT:     Head: Normocephalic and atraumatic.     Right Ear: Tympanic membrane normal.     Left Ear: Tympanic membrane normal.     Nose: Congestion present.     Right Sinus: Maxillary sinus tenderness and frontal sinus tenderness present.     Left Sinus: Maxillary sinus tenderness and frontal sinus tenderness present.     Mouth/Throat:     Lips: Pink.     Mouth: Mucous membranes are moist.     Pharynx: Oropharynx is clear. Uvula midline.  Neck:     Musculoskeletal: Normal range of motion.  Cardiovascular:     Rate and Rhythm: Normal rate and regular rhythm.  Pulmonary:     Effort: Pulmonary effort is normal. No respiratory distress.     Breath sounds: Normal breath sounds. No stridor. No wheezing or rhonchi.  Musculoskeletal: Normal range of motion.  Skin:    General: Skin is warm and dry.  Neurological:     Mental Status: He is alert and  oriented to person, place, and time.  Psychiatric:        Behavior: Behavior normal.      UC Treatments / Results  Labs (all labs ordered are listed, but only abnormal  results are displayed) Labs Reviewed - No data to display  EKG None  Radiology No results found.  Procedures Procedures (including critical care time)  Medications Ordered in UC Medications - No data to display  Initial Impression / Assessment and Plan / UC Course  I have reviewed the triage vital signs and the nursing notes.  Pertinent labs & imaging results that were available during my care of the patient were reviewed by me and considered in my medical decision making (see chart for details).     Hx and exam c/w sinusitis secondary to URI Will tx with Augmentin encouraged f/u with PCP AVS provided  Final Clinical Impressions(s) / UC Diagnoses   Final diagnoses:  Upper respiratory tract infection, unspecified type  Acute bacterial rhinosinusitis     Discharge Instructions      Please take antibiotics as prescribed and be sure to complete entire course even if you start to feel better to ensure infection does not come back.   You may take  acetaminophen every 4-6 hours or in combination with ibuprofen 400-600mg  every 6-8 hours as needed for pain, inflammation, and fever.  Be sure to well hydrated with clear liquids and get at least 8 hours of sleep at night, preferably more while sick.   Please follow up with family medicine in 1 week if needed.     ED Prescriptions    Medication Sig Dispense Auth. Provider   amoxicillin-clavulanate (AUGMENTIN) 875-125 MG tablet Take 1 tablet by mouth 2 (two) times daily. One po bid x 7 days 14 tablet Javen Ridings O, PA-C   benzonatate (TESSALON) 100 MG capsule Take 1-2 capsules (100-200 mg total) by mouth every 8 (eight) hours. 21 capsule Doroteo Glassman, Zanyia Silbaugh O, PA-C   ipratropium (ATROVENT) 0.06 % nasal spray Place 2 sprays into both nostrils 4 (four)  times daily. 15 mL Lurene Shadow, PA-C     Controlled Substance Prescriptions [place] Controlled Substance Registry consulted? Not Applicable   Rolla Plate 01/08/19 1730

## 2019-01-08 NOTE — Discharge Instructions (Signed)

## 2019-01-17 ENCOUNTER — Encounter: Payer: Self-pay | Admitting: Family Medicine

## 2019-01-17 ENCOUNTER — Ambulatory Visit (INDEPENDENT_AMBULATORY_CARE_PROVIDER_SITE_OTHER): Payer: Commercial Managed Care - PPO | Admitting: Family Medicine

## 2019-01-17 ENCOUNTER — Ambulatory Visit (INDEPENDENT_AMBULATORY_CARE_PROVIDER_SITE_OTHER): Payer: Commercial Managed Care - PPO

## 2019-01-17 ENCOUNTER — Other Ambulatory Visit: Payer: Self-pay

## 2019-01-17 VITALS — BP 165/77 | HR 84 | Temp 98.4°F | Wt 197.0 lb

## 2019-01-17 DIAGNOSIS — M7989 Other specified soft tissue disorders: Secondary | ICD-10-CM | POA: Diagnosis not present

## 2019-01-17 DIAGNOSIS — J329 Chronic sinusitis, unspecified: Secondary | ICD-10-CM | POA: Diagnosis not present

## 2019-01-17 DIAGNOSIS — M25572 Pain in left ankle and joints of left foot: Secondary | ICD-10-CM

## 2019-01-17 DIAGNOSIS — R05 Cough: Secondary | ICD-10-CM | POA: Diagnosis not present

## 2019-01-17 DIAGNOSIS — R059 Cough, unspecified: Secondary | ICD-10-CM

## 2019-01-17 DIAGNOSIS — J40 Bronchitis, not specified as acute or chronic: Secondary | ICD-10-CM

## 2019-01-17 MED ORDER — GUAIFENESIN-CODEINE 100-10 MG/5ML PO SOLN: 5.0000 mL | Freq: Four times a day (QID) | ORAL | 0 refills | Status: DC | PRN

## 2019-01-17 MED ORDER — PREDNISONE 50 MG PO TABS: 50.0000 mg | ORAL_TABLET | Freq: Every day | ORAL | 0 refills | Status: DC

## 2019-01-17 MED ORDER — ALBUTEROL SULFATE HFA 108 (90 BASE) MCG/ACT IN AERS: 2.0000 | INHALATION_SPRAY | Freq: Four times a day (QID) | RESPIRATORY_TRACT | 0 refills | Status: DC | PRN

## 2019-01-17 MED ORDER — LEVOFLOXACIN 500 MG PO TABS: 500.0000 mg | ORAL_TABLET | Freq: Every day | ORAL | 0 refills | Status: DC

## 2019-01-17 NOTE — Patient Instructions (Signed)
Thank you for coming in today. Continue the over the medicines.  Use the ankle brace.  Do the exercises for the ankle.  Take prednsione for 5 days.  Take the levaquin antibiotic.  Use the inhailler as needed.  Use the cough medicine as needed as well.   Call or go to the emergency room if you get worse, have trouble breathing, have chest pains, or palpitations.    Ankle Sprain, Phase I Rehab Ask your health care provider which exercises are safe for you. Do exercises exactly as told by your health care provider and adjust them as directed. It is normal to feel mild stretching, pulling, tightness, or discomfort as you do these exercises, but you should stop right away if you feel sudden pain or your pain gets worse.Do not begin these exercises until told by your health care provider. Stretching and range of motion exercises These exercises warm up your muscles and joints and improve the movement and flexibility of your lower leg and ankle. These exercises also help to relieve pain and stiffness. Exercise A: Gastroc and soleus stretch  1. Sit on the floor with your left / right leg extended. 2. Loop a belt or towel around the ball of your left / right foot. The ball of your foot is on the walking surface, right under your toes. 3. Keep your left / right ankle and foot relaxed and keep your knee straight while you use the belt or towel to pull your foot toward you. You should feel a gentle stretch behind your calf or knee. 4. Hold this position for __________ seconds, then release to the starting position. Repeat the exercise with your knee bent. You can put a pillow or a rolled bath towel under your knee to support it. You should feel a stretch deep in your calf or at your Achilles tendon. Repeat each stretch __________ times. Complete these stretches __________ times a day. Exercise B: Ankle alphabet  1. Sit with your left / right leg supported at the lower leg. ? Do not rest your foot on  anything. ? Make sure your foot has room to move freely. 2. Think of your left / right foot as a paintbrush, and move your foot to trace each letter of the alphabet in the air. Keep your hip and knee still while you trace. Make the letters as large as you can without feeling discomfort. 3. Trace every letter from A to Z. Repeat __________ times. Complete this exercise __________ times a day. Strengthening exercises These exercises build strength and endurance in your ankle and lower leg. Endurance is the ability to use your muscles for a long time, even after they get tired. Exercise C: Dorsiflexors  1. Secure a rubber exercise band or tube to an object, such as a table leg, that will stay still when the band is pulled. Secure the other end around your left / right foot. 2. Sit on the floor facing the object, with your left / right leg extended. The band or tube should be slightly tense when your foot is relaxed. 3. Slowly bring your foot toward you, pulling the band tighter. 4. Hold this position for __________ seconds. 5. Slowly return your foot to the starting position. Repeat __________ times. Complete this exercise __________ times a day. Exercise D: Plantar flexors  1. Sit on the floor with your left / right leg extended. 2. Loop a rubber exercise tube or band around the ball of your left / right foot.  The ball of your foot is on the walking surface, right under your toes. ? Hold the ends of the band or tube in your hands. ? The band or tube should be slightly tense when your foot is relaxed. 3. Slowly point your foot and toes downward, pushing them away from you. 4. Hold this position for __________ seconds. 5. Slowly return your foot to the starting position. Repeat __________ times. Complete this exercise __________ times a day. Exercise E: Evertors 1. Sit on the floor with your legs straight out in front of you. 2. Loop a rubber exercise band or tube around the ball of your left  / right foot. The ball of your foot is on the walking surface, right under your toes. ? Hold the ends of the band in your hands, or secure the band to a stable object. ? The band or tube should be slightly tense when your foot is relaxed. 3. Slowly push your foot outward, away from your other leg. 4. Hold this position for __________ seconds. 5. Slowly return your foot to the starting position. Repeat __________ times. Complete this exercise __________ times a day. This information is not intended to replace advice given to you by your health care provider. Make sure you discuss any questions you have with your health care provider. Document Released: 05/20/2005 Document Revised: 04/05/2017 Document Reviewed: 09/02/2015 Elsevier Interactive Patient Education  2019 [employer]    Acute Bronchitis, Adult  Acute bronchitis is sudden (acute) swelling of the air tubes (bronchi) in the lungs. Acute bronchitis causes these tubes to fill with mucus, which can make it hard to breathe. It can also cause coughing or wheezing. In adults, acute bronchitis usually goes away within 2 weeks. A cough caused by bronchitis may last up to 3 weeks. Smoking, allergies, and asthma can make the condition worse. Repeated episodes of bronchitis may cause further lung problems, such as chronic obstructive pulmonary disease (COPD). What are the causes? This condition can be caused by germs and by substances that irritate the lungs, including:  Cold and flu viruses. This condition is most often caused by the same virus that causes a cold.  Bacteria.  Exposure to tobacco smoke, dust, fumes, and air pollution. What increases the risk? This condition is more likely to develop in people who:  Have close contact with someone with acute bronchitis.  Are exposed to lung irritants, such as tobacco smoke, dust, fumes, and vapors.  Have a weak immune system.  Have a respiratory condition such as asthma. What are the  signs or symptoms? Symptoms of this condition include:  A cough.  Coughing up clear, yellow, or green mucus.  Wheezing.  Chest congestion.  Shortness of breath.  A fever.  Body aches.  Chills.  A sore throat. How is this diagnosed? This condition is usually diagnosed with a physical exam. During the exam, your health care provider may order tests, such as chest X-rays, to rule out other conditions. He or she may also:  Test a sample of your mucus for bacterial infection.  Check the level of oxygen in your blood. This is done to check for pneumonia.  Do a chest X-ray or lung function testing to rule out pneumonia and other conditions.  Perform blood tests. Your health care provider will also ask about your symptoms and medical history. How is this treated? Most cases of acute bronchitis clear up over time without treatment. Your health care provider may recommend:  Drinking more fluids. Drinking  more makes your mucus thinner, which may make it easier to breathe.  Taking a medicine for a fever or cough.  Taking an antibiotic medicine.  Using an inhaler to help improve shortness of breath and to control a cough.  Using a cool mist vaporizer or humidifier to make it easier to breathe. Follow these instructions at home: Medicines  Take over-the-counter and prescription medicines only as told by your health care provider.  If you were prescribed an antibiotic, take it as told by your health care provider. Do not stop taking the antibiotic even if you start to feel better. General instructions   Get plenty of rest.  Drink enough fluids to keep your urine pale yellow.  Avoid smoking and secondhand smoke. Exposure to cigarette smoke or irritating chemicals will make bronchitis worse. If you smoke and you need help quitting, ask your health care provider. Quitting smoking will help your lungs heal faster.  Use an inhaler, cool mist vaporizer, or humidifier as told by  your health care provider.  Keep all follow-up visits as told by your health care provider. This is important. How is this prevented? To lower your risk of getting this condition again:  Wash your hands often with soap and water. If soap and water are not available, use hand sanitizer.  Avoid contact with people who have cold symptoms.  Try not to touch your hands to your mouth, nose, or eyes.  Make sure to get the flu shot every year. Contact a health care provider if:  Your symptoms do not improve in 2 weeks of treatment. Get help right away if:  You cough up blood.  You have chest pain.  You have severe shortness of breath.  You become dehydrated.  You faint or keep feeling like you are going to faint.  You keep vomiting.  You have a severe headache.  Your fever or chills gets worse. This information is not intended to replace advice given to you by your health care provider. Make sure you discuss any questions you have with your health care provider. Document Released: 11/26/2004 Document Revised: 06/02/2017 Document Reviewed: 04/08/2016 Elsevier Interactive Patient Education  2019 [employer].

## 2019-01-17 NOTE — Progress Notes (Signed)
Damon Conrad is a 53 y.o. male who presents to [name]: Primary Care Sports Medicine today for sore throat cough congestion runny nose.  Symptoms ongoing for about 3 weeks.  His wife is sick with similar illness.  He was seen in urgent care on March 8.  He was thought to have sinusitis and prescribed amoxicillin Tessalon Perles and Atrovent nasal spray.  He notes amoxicillin helped a little but the nasal spray and Tessalon Perles have not helped.  Additionally he notes that he suffered a left ankle inversion about 3 weeks ago.  He notes initial medial and lateral ankle pain and swelling.  The lateral ankle pain has mostly resolved however he continues to have medial ankle pain and swelling.  The pain is worse after standing and walking all day.  He has tried some over-the-counter medications which have helped a little.   ROS as above:  Exam:  BP (!) 165/77   Pulse 84   Temp 98.4 F (36.9 C) (Oral)   Wt 197 lb (89.4 kg)   BMI 30.85 kg/m  Wt Readings from Last 5 Encounters:  01/17/19 197 lb (89.4 kg)  01/08/19 198 lb 8 oz (90 kg)  10/20/18 200 lb (90.7 kg)  05/03/18 198 lb (89.8 kg)  02/14/18 201 lb (91.2 kg)    Gen: Well NAD HEENT: EOMI,  MMM clear nasal discharge inflamed nasal turbinates. Lungs: Normal work of breathing.  Coarse breath sounds bilaterally. Heart: RRR no MRG Abd: NABS, Soft. Nondistended, Nontender Exts: Brisk capillary refill, warm and well perfused.   MSK: Left ankle slight effusion present mostly medial malleolus. Normal motion. Tender to palpation around medial malleolus. Stable ligamentous exam. Intact strength. Pulses capillary fill and sensation are intact distally. Antalgic gait present bilaterally  Lab and Radiology Results X-ray images personally and independently reviewed  2 view chest x-ray: bronchitic changes with no acute infiltrates.   Left  ankle: Soft tissue swelling with no acute fracture. Mild degenerative changes.  Await formal radiology review   Assessment and Plan: 53 y.o. male with  Cough and congestion.  Likely sinobronchitis.  Patient has had second sickening or failure to improve after Augmentin on March 8.  Plan to treat with albuterol, and Levaquin.  Will use prednisone given history of smoking and likely bronchitis in the past.  Codeine cough syrup prescribed for cough suppression as Tessalon did not help.  Ankle pain: Likely ankle sprain.  Plan for ASO brace at home exercise program.  Oral steroids will also help as well.  Recheck if not improving.  Work note provided.  PDMP reviewed during this encounter. Orders Placed This Encounter  Procedures  . DG Ankle Complete Left    Standing Status:   Future    Number of Occurrences:   1    Standing Expiration Date:   03/18/2020    Order Specific Question:   Reason for Exam (SYMPTOM  OR DIAGNOSIS REQUIRED)    Answer:   eval left ankle pain. Inversion injury swelling medially    Order Specific Question:   Preferred imaging location?    Answer:   [facility]    Order Specific Question:   Radiology Contrast Protocol - do NOT remove file path    Answer:   [email or web address]  . DG Chest 2 View    Order Specific Question:   Reason for exam:    Answer:   Cough, assess intra-thoracic pathology    Order Specific Question:  Preferred imaging location?    Answer:   [facility]   Meds ordered this encounter  Medications  . levofloxacin (LEVAQUIN) 500 MG tablet    Sig: Take 1 tablet (500 mg total) by mouth daily.    Dispense:  7 tablet    Refill:  0  . predniSONE (DELTASONE) 50 MG tablet    Sig: Take 1 tablet (50 mg total) by mouth daily.    Dispense:  5 tablet    Refill:  0  . albuterol (PROVENTIL HFA;VENTOLIN HFA) 108 (90 Base) MCG/ACT inhaler    Sig: Inhale 2 puffs into the lungs every 6 (six) hours as  needed for wheezing or shortness of breath.    Dispense:  1 Inhaler    Refill:  0  . guaiFENesin-codeine 100-10 MG/5ML syrup    Sig: Take 5 mLs by mouth every 6 (six) hours as needed for cough. Take mostly at bedtime    Dispense:  120 mL    Refill:  0     Historical information moved to improve visibility of documentation.  Past Medical History:  Diagnosis Date  . GERD (gastroesophageal reflux disease)   . HTN (hypertension), benign    Past Surgical History:  Procedure Laterality Date  . ABDOMINAL HERNIA REPAIR Bilateral    x 2   Social History   Tobacco Use  . Smoking status: Never Smoker  . Smokeless tobacco: Never Used  Substance Use Topics  . Alcohol use: Yes    Comment: Rarely   family history includes Alcohol abuse in his father; Cancer in his mother; Hypertension in his mother.  Medications: Current Outpatient Medications  Medication Sig Dispense Refill  . allopurinol (ZYLOPRIM) 100 MG tablet Take 1 tablet (100 mg total) by mouth daily. To prevent gout 90 tablet 1  . amoxicillin-clavulanate (AUGMENTIN) 875-125 MG tablet Take 1 tablet by mouth 2 (two) times daily. One po bid x 7 days 14 tablet 0  . atorvastatin (LIPITOR) 80 MG tablet Take 0.5 tablets (40 mg total) by mouth daily. 90 tablet 1  . azithromycin (ZITHROMAX) 250 MG tablet Take 1 tablet (250 mg total) by mouth daily. Take first 2 tablets together, then 1 every day until finished. 6 tablet 0  . baclofen (LIORESAL) 10 MG tablet Take 1 tablet (10 mg total) by mouth at bedtime as needed for muscle spasms. 90 each 3  . benzonatate (TESSALON) 100 MG capsule Take 1-2 capsules (100-200 mg total) by mouth every 8 (eight) hours. 21 capsule 0  . colchicine (COLCRYS) 0.6 MG tablet Take 1 tablet (0.6 mg total) by mouth daily. 90 tablet 1  . diclofenac sodium (VOLTAREN) 1 % GEL Apply 2 g topically 4 (four) times daily. To affected joint. 100 g 11  . ipratropium (ATROVENT) 0.06 % nasal spray Place 2 sprays into both  nostrils 4 (four) times daily. 15 mL 1  . lisinopril (PRINIVIL,ZESTRIL) 30 MG tablet Take 1 tablet (30 mg total) by mouth daily. 90 tablet 1  . metoprolol succinate (TOPROL-XL) 100 MG 24 hr tablet TAKE 1 TABLET BY MOUTH ONCE DAILY 90 tablet 0  . omeprazole (PRILOSEC) 40 MG capsule Take 1 capsule (40 mg total) by mouth daily. 90 capsule 4  . albuterol (PROVENTIL HFA;VENTOLIN HFA) 108 (90 Base) MCG/ACT inhaler Inhale 2 puffs into the lungs every 6 (six) hours as needed for wheezing or shortness of breath. 1 Inhaler 0  . guaiFENesin-codeine 100-10 MG/5ML syrup Take 5 mLs by mouth every 6 (six) hours as needed for  cough. Take mostly at bedtime 120 mL 0  . levofloxacin (LEVAQUIN) 500 MG tablet Take 1 tablet (500 mg total) by mouth daily. 7 tablet 0  . predniSONE (DELTASONE) 50 MG tablet Take 1 tablet (50 mg total) by mouth daily. 5 tablet 0   No current facility-administered medications for this visit.    Allergies  Allergen Reactions  . Meloxicam     rash     Discussed warning signs or symptoms. Please see discharge instructions. Patient expresses understanding.

## 2019-01-18 ENCOUNTER — Telehealth: Payer: Self-pay | Admitting: Family Medicine

## 2019-01-18 NOTE — Telephone Encounter (Signed)
We will keep an eye on him.  If worsening return to clinic.  Continue current regimen.

## 2019-01-18 NOTE — Telephone Encounter (Signed)
Attempted to contact Pt, no answer and no VM. 

## 2019-01-18 NOTE — Telephone Encounter (Signed)
-----   Message from Normand Sloop, LPN sent at 07/19/9149  9:36 AM EDT ----- Patient notified of results. Wanted you to know was running a temp last night of 100.7 then went down to 99.6 and this morning it is at 98.5. KG LPN

## 2019-01-19 ENCOUNTER — Ambulatory Visit: Payer: Commercial Managed Care - PPO | Admitting: Family Medicine

## 2019-01-20 ENCOUNTER — Telehealth: Payer: Self-pay | Admitting: Family Medicine

## 2019-01-20 ENCOUNTER — Ambulatory Visit (INDEPENDENT_AMBULATORY_CARE_PROVIDER_SITE_OTHER): Payer: Commercial Managed Care - PPO | Admitting: Sports Medicine

## 2019-01-20 ENCOUNTER — Ambulatory Visit (INDEPENDENT_AMBULATORY_CARE_PROVIDER_SITE_OTHER): Payer: Commercial Managed Care - PPO

## 2019-01-20 ENCOUNTER — Encounter: Payer: Self-pay | Admitting: Sports Medicine

## 2019-01-20 ENCOUNTER — Other Ambulatory Visit: Payer: Self-pay

## 2019-01-20 DIAGNOSIS — J0101 Acute recurrent maxillary sinusitis: Secondary | ICD-10-CM | POA: Insufficient documentation

## 2019-01-20 DIAGNOSIS — R05 Cough: Secondary | ICD-10-CM

## 2019-01-20 DIAGNOSIS — J0100 Acute maxillary sinusitis, unspecified: Secondary | ICD-10-CM | POA: Diagnosis not present

## 2019-01-20 MED ORDER — HYDROCOD POLST-CPM POLST ER 10-8 MG/5ML PO SUER: 5.0000 mL | Freq: Two times a day (BID) | ORAL | 0 refills | Status: DC | PRN

## 2019-01-20 MED ORDER — TRIAMCINOLONE ACETONIDE 55 MCG/ACT NA AERO: 2.0000 | INHALATION_SPRAY | Freq: Every day | NASAL | 11 refills | Status: DC

## 2019-01-20 MED ORDER — BENZONATATE 200 MG PO CAPS: 200.0000 mg | ORAL_CAPSULE | Freq: Three times a day (TID) | ORAL | 0 refills | Status: DC | PRN

## 2019-01-20 MED ORDER — AZITHROMYCIN 250 MG PO TABS: ORAL_TABLET | ORAL | 0 refills | Status: DC

## 2019-01-20 NOTE — Telephone Encounter (Signed)
I scheduled pt with Dr.T  TODAY for 2:20 check in

## 2019-01-20 NOTE — Telephone Encounter (Signed)
Pt seen Dr. Denyse Amass on Tuesday and he feels worse. Having cough and fever on and off, and some chest tightness. Patient wants to know if he needs to come back in? Please advise pt as soon as you can. [phone]

## 2019-01-20 NOTE — Progress Notes (Signed)
Subjective:    CC: Coughing  HPI: This is a pleasant 53 year old male, he has been seen several times over the past couple of weeks, once in urgent care and was prescribed Augmentin, he was seen by Dr. Denyse Amass and prescribed prednisone, Levaquin.  Continues to have a cough, no real fevers.  His highest temperature was about 100 degrees.  This was several days ago, no muscle aches, body aches, GI symptoms.  His principal symptom is cough, and sinus pain and pressure.  Shortness of breath, no chest pain.  I reviewed the past medical history, family history, social history, surgical history, and allergies today and no changes were needed.  Please see the problem list section below in epic for further details.  Past Medical History: Past Medical History:  Diagnosis Date  . GERD (gastroesophageal reflux disease)   . HTN (hypertension), benign    Past Surgical History: Past Surgical History:  Procedure Laterality Date  . ABDOMINAL HERNIA REPAIR Bilateral    x 2   Social History: Social History   Socioeconomic History  . Marital status: Single    Spouse name: Not on file  . Number of children: Not on file  . Years of education: Not on file  . Highest education level: Not on file  Occupational History  . Not on file  Social Needs  . Financial resource strain: Not on file  . Food insecurity:    Worry: Not on file    Inability: Not on file  . Transportation needs:    Medical: Not on file    Non-medical: Not on file  Tobacco Use  . Smoking status: Never Smoker  . Smokeless tobacco: Never Used  Substance and Sexual Activity  . Alcohol use: Yes    Comment: Rarely  . Drug use: No  . Sexual activity: Yes    Partners: Female  Lifestyle  . Physical activity:    Days per week: Not on file    Minutes per session: Not on file  . Stress: Not on file  Relationships  . Social connections:    Talks on phone: Not on file    Gets together: Not on file    Attends religious service: Not  on file    Active member of club or organization: Not on file    Attends meetings of clubs or organizations: Not on file    Relationship status: Not on file  Other Topics Concern  . Not on file  Social History Narrative  . Not on file   Family History: Family History  Problem Relation Age of Onset  . Alcohol abuse Father   . Cancer Mother   . Hypertension Mother    Allergies: Allergies  Allergen Reactions  . Meloxicam     rash   Medications: See med rec.  Review of Systems: No fevers, chills, night sweats, weight loss, chest pain, or shortness of breath.   Objective:    General: Well Developed, well nourished, and in no acute distress.  Neuro: Alert and oriented x3, extra-ocular muscles intact, sensation grossly intact.  HEENT: Normocephalic, atraumatic, pupils equal round reactive to light, neck supple, no masses, no lymphadenopathy, thyroid nonpalpable.  Oropharynx is minimally erythematous, nasopharynx, ear canals unremarkable. Skin: Warm and dry, no rashes. Cardiac: Regular rate and rhythm, no murmurs rubs or gallops, no lower extremity edema.  Respiratory: Clear to auscultation bilaterally. Not using accessory muscles, speaking in full sentences.  Impression and Recommendations:    Acute maxillary sinusitis Persistent facial symptoms  with post-infectious cough. No fever. No myalgias. We will try antibiotics one more time. Azithromycin, nasocort, tussionex, tessalon perles, out of work for a week. Chest x-ray. If persistent symptoms we need to CT his sinuses.   ___________________________________________ Ihor Austin. Benjamin Stain, M.D., ABFM., CAQSM. Primary Care and Sports Medicine [facility]  Adjunct Professor of Family Medicine  [employer]

## 2019-01-20 NOTE — Telephone Encounter (Signed)
Reasonable to be seen this week if able.  Please call the clinic and try to schedule follow-up appointment with 1 of my partners.  However could follow-up on Monday or see urgent care.

## 2019-01-20 NOTE — Assessment & Plan Note (Addendum)
Persistent facial symptoms with post-infectious cough. No fever. No myalgias. We will try antibiotics one more time. Azithromycin, nasocort, tussionex, tessalon perles, out of work for a week. Chest x-ray. If persistent symptoms we need to CT his sinuses.

## 2019-01-23 MED ORDER — HYDROCOD POLST-CPM POLST ER 10-8 MG/5ML PO SUER: 5.0000 mL | Freq: Two times a day (BID) | ORAL | 0 refills | Status: DC | PRN

## 2019-02-16 ENCOUNTER — Telehealth: Payer: Self-pay

## 2019-02-16 NOTE — Telephone Encounter (Signed)
Patient called requested a Zpack sent to his pharmacy, he stated that he has a sinus infection along with headache. Please advise. Rhonda Cunningham,CMA

## 2019-02-16 NOTE — Telephone Encounter (Signed)
Please advise patient to schedule video virtual visit with me tomorrow morning.

## 2019-02-17 ENCOUNTER — Encounter: Payer: Self-pay | Admitting: Sports Medicine

## 2019-02-17 ENCOUNTER — Ambulatory Visit (INDEPENDENT_AMBULATORY_CARE_PROVIDER_SITE_OTHER): Payer: Commercial Managed Care - PPO | Admitting: Sports Medicine

## 2019-02-17 DIAGNOSIS — J0101 Acute recurrent maxillary sinusitis: Secondary | ICD-10-CM | POA: Diagnosis not present

## 2019-02-17 MED ORDER — PREDNISONE 10 MG (48) PO TBPK: ORAL_TABLET | Freq: Every day | ORAL | 0 refills | Status: DC

## 2019-02-17 MED ORDER — HYDROCOD POLST-CPM POLST ER 10-8 MG/5ML PO SUER: 5.0000 mL | Freq: Two times a day (BID) | ORAL | 0 refills | Status: DC | PRN

## 2019-02-17 MED ORDER — AZITHROMYCIN 250 MG PO TABS: ORAL_TABLET | ORAL | 0 refills | Status: DC

## 2019-02-17 NOTE — Progress Notes (Signed)
Subjective:    CC: Still feeling sick  HPI: A month ago I treated this pleasant 53 year old male for a maxillary sinusitis, he had been treated multiple times before.  Ultimately the plan was to obtain a CT if he had recurrent symptoms.  He has developed recurrent facial pain and pressure, nasal discharge, mild cough.  No fevers, chills, no GI symptoms.  No rash.  I reviewed the past medical history, family history, social history, surgical history, and allergies today and no changes were needed.  Please see the problem list section below in epic for further details.  Past Medical History: Past Medical History:  Diagnosis Date  . GERD (gastroesophageal reflux disease)   . HTN (hypertension), benign    Past Surgical History: Past Surgical History:  Procedure Laterality Date  . ABDOMINAL HERNIA REPAIR Bilateral    x 2   Social History: Social History   Socioeconomic History  . Marital status: Single    Spouse name: Not on file  . Number of children: Not on file  . Years of education: Not on file  . Highest education level: Not on file  Occupational History  . Not on file  Social Needs  . Financial resource strain: Not on file  . Food insecurity:    Worry: Not on file    Inability: Not on file  . Transportation needs:    Medical: Not on file    Non-medical: Not on file  Tobacco Use  . Smoking status: Never Smoker  . Smokeless tobacco: Never Used  Substance and Sexual Activity  . Alcohol use: Yes    Comment: Rarely  . Drug use: No  . Sexual activity: Yes    Partners: Female  Lifestyle  . Physical activity:    Days per week: Not on file    Minutes per session: Not on file  . Stress: Not on file  Relationships  . Social connections:    Talks on phone: Not on file    Gets together: Not on file    Attends religious service: Not on file    Active member of club or organization: Not on file    Attends meetings of clubs or organizations: Not on file    Relationship  status: Not on file  Other Topics Concern  . Not on file  Social History Narrative  . Not on file   Family History: Family History  Problem Relation Age of Onset  . Alcohol abuse Father   . Cancer Mother   . Hypertension Mother    Allergies: Allergies  Allergen Reactions  . Meloxicam     rash   Medications: See med rec.  Review of Systems: No fevers, chills, night sweats, weight loss, chest pain, or shortness of breath.   Objective:    General: Well Developed, well nourished, and in no acute distress.  Neuro: Alert and oriented x3, extra-ocular muscles intact, sensation grossly intact.  HEENT: Normocephalic, atraumatic, pupils equal round reactive to light, neck supple, no masses, no lymphadenopathy, thyroid nonpalpable.  Oropharynx shows swollen, erythematous right tonsil with a tonsil stone, tender to palpation over the frontal and maxillary sinuses.  Erythematous nasal turbinates. Skin: Warm and dry, no rashes. Cardiac: Regular rate and rhythm, no murmurs rubs or gallops, no lower extremity edema.  Respiratory: Clear to auscultation bilaterally. Not using accessory muscles, speaking in full sentences.  Impression and Recommendations:    Recurrent maxillary sinusitis When it is time to proceed with maxillary sinus CT. He has significant  left tonsillitis, as well as a tonsil stone. Prednisone, azithromycin, sinus CT. Patient declines intramuscular penicillin. Return to see PCP if no better in 2 weeks.   ___________________________________________ Ihor Austinhomas J. Benjamin Stainhekkekandam, M.D., ABFM., CAQSM. Primary Care and Sports Medicine [facility] [place]  Adjunct Professor of Family Medicine  [employer]

## 2019-02-17 NOTE — Addendum Note (Signed)
Addended by: Donne Anon L on: 02/17/2019 03:51 PM   Modules accepted: Orders

## 2019-02-17 NOTE — Assessment & Plan Note (Signed)
When it is time to proceed with maxillary sinus CT. He has significant left tonsillitis, as well as a tonsil stone. Prednisone, azithromycin, sinus CT. Patient declines intramuscular penicillin. Return to see PCP if no better in 2 weeks.

## 2019-02-17 NOTE — Telephone Encounter (Signed)
Pt has already been scheduled with Dr T today

## 2019-02-17 NOTE — Addendum Note (Signed)
Addended by: Monica Becton on: 02/17/2019 03:53 PM   Modules accepted: Orders

## 2019-02-21 ENCOUNTER — Other Ambulatory Visit: Payer: Self-pay

## 2019-02-21 ENCOUNTER — Ambulatory Visit (INDEPENDENT_AMBULATORY_CARE_PROVIDER_SITE_OTHER): Payer: Commercial Managed Care - PPO

## 2019-02-21 DIAGNOSIS — J0101 Acute recurrent maxillary sinusitis: Secondary | ICD-10-CM

## 2019-02-22 ENCOUNTER — Telehealth: Payer: Self-pay | Admitting: Family Medicine

## 2019-02-22 DIAGNOSIS — I10 Essential (primary) hypertension: Secondary | ICD-10-CM

## 2019-02-22 DIAGNOSIS — K118 Other diseases of salivary glands: Secondary | ICD-10-CM

## 2019-02-22 DIAGNOSIS — R221 Localized swelling, mass and lump, neck: Secondary | ICD-10-CM

## 2019-02-22 NOTE — Telephone Encounter (Signed)
CT scan of the sinuses does not show sinus infection.  You do have a deviated septum and I think sometimes that will cause your sinus pain and pressure.  However you do have an enlarged lymph node or mass in the right side of the neck that was partially seen with the CT scan of the face.  This will need to be evaluated with a CT scan of the neck using IV contrast.  I will order this test to follow-up this issue then we will have a discussion about the results and talk about next steps.     IMPRESSION: No significant paranasal sinus disease, despite 5 mm nasal septal deviation LEFT-to-RIGHT and RIGHT OMU obstruction.  Incompletely visualized 13 x 17 mm ovoid solid mass on the RIGHT; query parotid lesion versus enlarged lymph node. CT of the neck with contrast is needed for further evaluation.

## 2019-02-22 NOTE — Telephone Encounter (Signed)
Labs required per radiology protocol. Ordered.

## 2019-02-22 NOTE — Telephone Encounter (Signed)
Pt advised.   Working with insurance for second CT approval. Pt aware he will be contacted for scheduling

## 2019-02-23 LAB — COMPLETE METABOLIC PANEL WITH GFR
AG Ratio: 1.3 (calc) (ref 1.0–2.5)
ALT: 16 U/L (ref 9–46)
AST: 11 U/L (ref 10–35)
Albumin: 3.7 g/dL (ref 3.6–5.1)
Alkaline phosphatase (APISO): 66 U/L (ref 35–144)
BUN: 19 mg/dL (ref 7–25)
CO2: 30 mmol/L (ref 20–32)
Calcium: 9.2 mg/dL (ref 8.6–10.3)
Chloride: 103 mmol/L (ref 98–110)
Creat: 1.17 mg/dL (ref 0.70–1.33)
GFR, Est African American: 81 mL/min/{1.73_m2} (ref >=60)
GFR, Est Non African American: 70 mL/min/{1.73_m2} (ref >=60)
Globulin: 2.8 g/dL (calc) (ref 1.9–3.7)
Glucose, Bld: 98 mg/dL (ref 65–99)
Potassium: 4.6 mmol/L (ref 3.5–5.3)
Sodium: 138 mmol/L (ref 135–146)
Total Bilirubin: 0.4 mg/dL (ref 0.2–1.2)
Total Protein: 6.5 g/dL (ref 6.1–8.1)

## 2019-02-24 ENCOUNTER — Ambulatory Visit (INDEPENDENT_AMBULATORY_CARE_PROVIDER_SITE_OTHER): Payer: Commercial Managed Care - PPO

## 2019-02-24 ENCOUNTER — Other Ambulatory Visit: Payer: Self-pay

## 2019-02-24 DIAGNOSIS — R221 Localized swelling, mass and lump, neck: Secondary | ICD-10-CM | POA: Diagnosis not present

## 2019-02-24 DIAGNOSIS — K118 Other diseases of salivary glands: Secondary | ICD-10-CM

## 2019-02-24 MED ORDER — IOHEXOL 300 MG/ML  SOLN
100.0000 mL | Freq: Once | INTRAMUSCULAR | Status: AC | PRN
  Administered 2019-02-24: 75 mL via INTRAVENOUS

## 2019-02-27 ENCOUNTER — Telehealth: Payer: Self-pay | Admitting: Family Medicine

## 2019-02-27 DIAGNOSIS — K118 Other diseases of salivary glands: Secondary | ICD-10-CM

## 2019-02-27 NOTE — Telephone Encounter (Signed)
Done - CF °

## 2019-02-27 NOTE — Telephone Encounter (Signed)
Pt advised of results, pt has an ENT in mind, but will call back with his name.

## 2019-02-27 NOTE — Telephone Encounter (Signed)
Patients wife called and is requesting referral for ENT to be place with Dr. Arville Lime in [facility]  Phone number is [phone]

## 2019-02-27 NOTE — Telephone Encounter (Signed)
CT scan shows right parotid mass.  Will refer to ENT.  Please make sure you bring a copy of the CD images from the CT scan with you to the appointment.  Let me know if you do not hear about appointment soon.

## 2019-03-02 ENCOUNTER — Ambulatory Visit: Payer: Commercial Managed Care - PPO | Admitting: Family Medicine

## 2019-03-07 ENCOUNTER — Other Ambulatory Visit: Payer: Self-pay | Admitting: *Deleted

## 2019-03-07 ENCOUNTER — Other Ambulatory Visit: Payer: Self-pay | Admitting: Family Medicine

## 2019-03-07 ENCOUNTER — Telehealth: Payer: Self-pay | Admitting: Family Medicine

## 2019-03-07 DIAGNOSIS — K2100 Gastro-esophageal reflux disease with esophagitis, without bleeding: Secondary | ICD-10-CM

## 2019-03-07 DIAGNOSIS — I10 Essential (primary) hypertension: Secondary | ICD-10-CM

## 2019-03-07 NOTE — Telephone Encounter (Signed)
Patient would like to know if he can get a copy of his CT Scan, he called downstairs to get a copy since he has an appointment today with the throat doctor and they said the machine is broken. Wanted to know if Dr.Corey can give him a copy and he will come by and pick it up. Please contact and advise.

## 2019-03-07 NOTE — Telephone Encounter (Signed)
We cannot burn disc in our office. I contacted the radiology dept downstairs, they will get the disc ready for Pt to pick up.

## 2019-03-07 NOTE — Telephone Encounter (Signed)
Called patient and let him know. No further questions at this time.

## 2019-04-14 ENCOUNTER — Other Ambulatory Visit: Payer: Self-pay | Admitting: Family Medicine

## 2019-04-14 ENCOUNTER — Encounter: Payer: Self-pay | Admitting: Family Medicine

## 2019-04-14 DIAGNOSIS — I10 Essential (primary) hypertension: Secondary | ICD-10-CM

## 2019-04-14 NOTE — Telephone Encounter (Signed)
Error

## 2019-05-01 ENCOUNTER — Ambulatory Visit (INDEPENDENT_AMBULATORY_CARE_PROVIDER_SITE_OTHER): Payer: Commercial Managed Care - PPO | Admitting: Family Medicine

## 2019-05-01 ENCOUNTER — Encounter: Payer: Self-pay | Admitting: Family Medicine

## 2019-05-01 VITALS — BP 136/81 | HR 61 | Temp 98.1°F | Wt 206.0 lb

## 2019-05-01 DIAGNOSIS — M7712 Lateral epicondylitis, left elbow: Secondary | ICD-10-CM | POA: Diagnosis not present

## 2019-05-01 DIAGNOSIS — E782 Mixed hyperlipidemia: Secondary | ICD-10-CM | POA: Diagnosis not present

## 2019-05-01 DIAGNOSIS — I10 Essential (primary) hypertension: Secondary | ICD-10-CM

## 2019-05-01 DIAGNOSIS — M1A9XX0 Chronic gout, unspecified, without tophus (tophi): Secondary | ICD-10-CM | POA: Diagnosis not present

## 2019-05-01 MED ORDER — METOPROLOL SUCCINATE ER 100 MG PO TB24: 100.0000 mg | ORAL_TABLET | Freq: Every day | ORAL | 1 refills | Status: DC

## 2019-05-01 MED ORDER — LISINOPRIL 30 MG PO TABS: 30.0000 mg | ORAL_TABLET | Freq: Every day | ORAL | 1 refills | Status: DC

## 2019-05-01 MED ORDER — DICLOFENAC SODIUM 1 % TD GEL: 4.0000 g | Freq: Four times a day (QID) | TRANSDERMAL | 11 refills | Status: DC

## 2019-05-01 MED ORDER — ATORVASTATIN CALCIUM 80 MG PO TABS: 40.0000 mg | ORAL_TABLET | Freq: Every day | ORAL | 1 refills | Status: DC

## 2019-05-01 MED ORDER — ALLOPURINOL 100 MG PO TABS: 100.0000 mg | ORAL_TABLET | Freq: Every day | ORAL | 1 refills | Status: DC

## 2019-05-01 NOTE — Progress Notes (Signed)
Damon Conrad is a 53 y.o. male who presents to [facility] today for left elbow pain hypertension hyperlipidemia gout.  Hypertension: Managed with lisinopril and metoprolol.  Patient is doing well.  He occasionally gets some lightheadedness episodes at work but that is very rare.  He denies chest pain palpitation shortness of breath.  He tries to stay hydrated by drinking plenty fluids.  Hyperlipidemia managed with atorvastatin 80.  He tolerates it well with no issues.  Gout: Prevented with allopurinol. Last Uric acid was 7.5 on 03/2018.   Left lateral elbow pain x1 month. No injury history. Tried a few days of prednisone. Tried ibuprofen and topical icy hot etc. this did not help much.  He notes pain is worse with extension of the wrist and activity of the wrist.  He continues to work in a Psychologist, educational job.  Pain does occur at work as well.  ROS as above:  Exam:  BP 136/81   Pulse 61   Temp 98.1 F (36.7 C) (Oral)   Wt 206 lb (93.4 kg)   BMI 32.26 kg/m  Wt Readings from Last 5 Encounters:  05/01/19 206 lb (93.4 kg)  02/17/19 197 lb (89.4 kg)  01/17/19 197 lb (89.4 kg)  01/08/19 198 lb 8 oz (90 kg)  10/20/18 200 lb (90.7 kg)    Gen: Well NAD HEENT: EOMI,  MMM Lungs: Normal work of breathing. CTABL Heart: RRR no MRG Abd: NABS, Soft. Nondistended, Nontender Exts: Brisk capillary refill, warm and well perfused.  MSK: Left elbow normal-appearing.  Tender palpation lateral epicondyle.  Normal elbow motion.  Pain with resisted wrist extension.  Some pain also present in the mid bulk of the biceps in the upper arm with resisted elbow flexion and supination.     Assessment and Plan: 53 y.o. male with  Left elbow pain: Lateral condyle-itis with mild biceps tendinitis.  Plan to treat with eccentric exercises diclofenac gel stretching and counterforce brace.   Recheck if not improving.  Hypertension: Doing well.  Continue current regimen.  Check basic labs listed below.  Hyperlipidemia: Also doing reasonably well at last check.  Check direct LDL.  Gout: Check uric acid.  Continue allopurinol.  PDMP not reviewed this encounter. Orders Placed This Encounter  Procedures  . CBC  . COMPLETE METABOLIC PANEL WITH GFR  . LDL cholesterol, direct  . Uric acid   Meds ordered this encounter  Medications  . allopurinol (ZYLOPRIM) 100 MG tablet    Sig: Take 1 tablet (100 mg total) by mouth daily. To prevent gout    Dispense:  90 tablet    Refill:  1  . atorvastatin (LIPITOR) 80 MG tablet    Sig: Take 0.5 tablets (40 mg total) by mouth daily.    Dispense:  90 tablet    Refill:  1  . lisinopril (ZESTRIL) 30 MG tablet    Sig: Take 1 tablet (30 mg total) by mouth daily.    Dispense:  90 tablet    Refill:  1  . metoprolol succinate (TOPROL-XL) 100 MG 24 hr tablet    Sig: Take 1 tablet (100 mg total) by mouth daily. Take with or immediately following a meal.    Dispense:  90 tablet    Refill:  1  . diclofenac sodium (VOLTAREN) 1 % GEL    Sig: Apply 4 g topically 4 (four) times daily. To affected joint.    Dispense:  100 g  Refill:  11     Historical information moved to improve visibility of documentation.  Past Medical History:  Diagnosis Date  . GERD (gastroesophageal reflux disease)   . HTN (hypertension), benign    Past Surgical History:  Procedure Laterality Date  . ABDOMINAL HERNIA REPAIR Bilateral    x 2   Social History   Tobacco Use  . Smoking status: Never Smoker  . Smokeless tobacco: Never Used  Substance Use Topics  . Alcohol use: Yes    Comment: Rarely   family history includes Alcohol abuse in his father; Cancer in his mother; Hypertension in his mother.  Medications: Current Outpatient Medications  Medication Sig Dispense Refill  . allopurinol (ZYLOPRIM) 100 MG tablet Take 1 tablet (100 mg total) by mouth  daily. To prevent gout 90 tablet 1  . atorvastatin (LIPITOR) 80 MG tablet Take 0.5 tablets (40 mg total) by mouth daily. 90 tablet 1  . baclofen (LIORESAL) 10 MG tablet Take 1 tablet (10 mg total) by mouth at bedtime as needed for muscle spasms. 90 each 3  . colchicine (COLCRYS) 0.6 MG tablet Take 1 tablet (0.6 mg total) by mouth daily. 90 tablet 1  . ipratropium (ATROVENT) 0.06 % nasal spray Place 2 sprays into both nostrils 4 (four) times daily. 15 mL 1  . lisinopril (ZESTRIL) 30 MG tablet Take 1 tablet (30 mg total) by mouth daily. 90 tablet 1  . metoprolol succinate (TOPROL-XL) 100 MG 24 hr tablet Take 1 tablet (100 mg total) by mouth daily. Take with or immediately following a meal. 90 tablet 1  . omeprazole (PRILOSEC) 40 MG capsule Take 1 capsule by mouth once daily 90 capsule 0  . triamcinolone (NASACORT) 55 MCG/ACT AERO nasal inhaler Place 2 sprays into the nose daily. 1 Inhaler 11  . diclofenac sodium (VOLTAREN) 1 % GEL Apply 4 g topically 4 (four) times daily. To affected joint. 100 g 11   No current facility-administered medications for this visit.    Allergies  Allergen Reactions  . Meloxicam     rash     Discussed warning signs or symptoms. Please see discharge instructions. Patient expresses understanding.

## 2019-05-01 NOTE — Patient Instructions (Addendum)
Thank you for coming in today. I think you have tennis elbow.  Use counterforce brace for tennis elbow.  Do the stretching with the elbow straight pulling hand and wrist down.  Do the exercises with the elbow straight going from wrist up to wrist down slowly (should take 3 seconds) with resistnce.  Try using diclofenac gel on the elbow.  Let me know if you cannot find it at home.     Tennis Elbow Tennis elbow is swelling (inflammation) in your outer forearm, near your elbow. Swelling affects the tissues that connect muscle to bone (tendons). Tennis elbow can happen in any sport or job in which you use your elbow too much. It is caused by doing the same motion over and over. Tennis elbow can cause:  Pain and tenderness in your forearm and the outer part of your elbow. You may have pain all the time, or only when using the arm.  A burning feeling. This runs from your elbow through your arm.  Weak grip in your hand. Follow these instructions at home: Activity  Rest your elbow and wrist. Avoid activities that cause problems, as told by your doctor.  If told by your doctor, wear an elbow strap to reduce stress on the area.  Do physical therapy exercises as told.  If you lift an object, lift it with your palm facing up. This is easier on your elbow. Lifestyle  If your tennis elbow is caused by sports, check your equipment and make sure that: ? You are using it correctly. ? It fits you well.  If your tennis elbow is caused by work or by using a computer, take breaks often to stretch your arm. Talk with your manager about how you can manage your condition at work. If you have a brace:  Wear the brace as told by your doctor. Remove it only as told by your doctor.  Loosen the brace if your fingers tingle, get numb, or turn cold and blue.  Keep the brace clean.  If the brace is not waterproof, ask your doctor if you may take the brace off for bathing. If you must keep the brace on  while bathing: ? Do not let it get wet. ? Cover it with a watertight covering when you take a bath or a shower. General instructions   If told, put ice on the painful area: ? Put ice in a plastic bag. ? Place a towel between your skin and the bag. ? Leave the ice on for 20 minutes, 2-3 times a day.  Take over-the-counter and prescription medicines only as told by your doctor.  Keep all follow-up visits as told by your doctor. This is important. Contact a doctor if:  Your pain does not get better with treatment.  Your pain gets worse.  You have weakness in your forearm, hand, or fingers.  You cannot feel your forearm, hand, or fingers. Summary  Tennis elbow is swelling (inflammation) in your outer forearm, near your elbow.  Tennis elbow is caused by doing the same motion over and over.  Rest your elbow and wrist. Avoid activities that cause problems, as told by your doctor.  If told, put ice on the painful area for 20 minutes, 2-3 times a day. This information is not intended to replace advice given to you by your health care provider. Make sure you discuss any questions you have with your health care provider. Document Released: 04/08/2010 Document Revised: 07/15/2018 Document Reviewed: 08/03/2017 Elsevier Patient Education  2020 Elsevier Inc.   

## 2019-05-02 LAB — LDL CHOLESTEROL, DIRECT: Direct LDL: 119 mg/dL — ABNORMAL HIGH (ref <100)

## 2019-05-02 LAB — CBC
HCT: 41 % (ref 38.5–50.0)
Hemoglobin: 13.8 g/dL (ref 13.2–17.1)
MCH: 30 pg (ref 27.0–33.0)
MCHC: 33.7 g/dL (ref 32.0–36.0)
MCV: 89.1 fL (ref 80.0–100.0)
MPV: 11.3 fL (ref 7.5–12.5)
Platelets: 184 10*3/uL (ref 140–400)
RBC: 4.6 10*6/uL (ref 4.20–5.80)
RDW: 13.7 % (ref 11.0–15.0)
WBC: 6.2 10*3/uL (ref 3.8–10.8)

## 2019-05-02 LAB — COMPLETE METABOLIC PANEL WITH GFR
AG Ratio: 1.5 (calc) (ref 1.0–2.5)
ALT: 15 U/L (ref 9–46)
AST: 17 U/L (ref 10–35)
Albumin: 3.9 g/dL (ref 3.6–5.1)
Alkaline phosphatase (APISO): 63 U/L (ref 35–144)
BUN: 15 mg/dL (ref 7–25)
CO2: 28 mmol/L (ref 20–32)
Calcium: 9.5 mg/dL (ref 8.6–10.3)
Chloride: 107 mmol/L (ref 98–110)
Creat: 1.1 mg/dL (ref 0.70–1.33)
GFR, Est African American: 88 mL/min/{1.73_m2} (ref >=60)
GFR, Est Non African American: 76 mL/min/{1.73_m2} (ref >=60)
Globulin: 2.6 g/dL (calc) (ref 1.9–3.7)
Glucose, Bld: 94 mg/dL (ref 65–139)
Potassium: 4.5 mmol/L (ref 3.5–5.3)
Sodium: 145 mmol/L (ref 135–146)
Total Bilirubin: 0.5 mg/dL (ref 0.2–1.2)
Total Protein: 6.5 g/dL (ref 6.1–8.1)

## 2019-05-02 LAB — URIC ACID: Uric Acid, Serum: 9.3 mg/dL — ABNORMAL HIGH (ref 4.0–8.0)

## 2019-05-02 MED ORDER — ALLOPURINOL 300 MG PO TABS: 300.0000 mg | ORAL_TABLET | Freq: Every day | ORAL | 1 refills | Status: DC

## 2019-05-02 NOTE — Addendum Note (Signed)
Addended by: Gregor Hams on: 05/02/2019 05:27 AM   Modules accepted: Orders

## 2019-06-12 ENCOUNTER — Other Ambulatory Visit: Payer: Self-pay | Admitting: Family Medicine

## 2019-06-12 DIAGNOSIS — K2100 Gastro-esophageal reflux disease with esophagitis, without bleeding: Secondary | ICD-10-CM

## 2019-07-28 ENCOUNTER — Telehealth: Payer: Self-pay | Admitting: Family Medicine

## 2019-07-28 NOTE — Telephone Encounter (Signed)
Patient left voicemail with [facility] nurse. When calling patient to schedule an appointment for cold symptoms as a virtual visit patient declined and wanted a z-pak called in. States that he has been able to just get something called in before or he was able to come in off. I offered to transfer call to triage and patient also declined, stated that he was going to the covid center and then patient hung up on me.

## 2019-08-01 NOTE — Telephone Encounter (Signed)
Would like to have phone or video visit with patient before antibiotic prescription sent.

## 2019-08-01 NOTE — Telephone Encounter (Signed)
Pt notified of recommendations.  He stated that he is trying some otc cough meds.  He said that he will call tomorrow if he needs an appointment -EH/RMA

## 2019-08-31 ENCOUNTER — Other Ambulatory Visit: Payer: Self-pay | Admitting: Family Medicine

## 2019-09-08 ENCOUNTER — Other Ambulatory Visit: Payer: Self-pay | Admitting: Family Medicine

## 2019-09-08 DIAGNOSIS — K2100 Gastro-esophageal reflux disease with esophagitis, without bleeding: Secondary | ICD-10-CM

## 2019-10-20 ENCOUNTER — Other Ambulatory Visit: Payer: Self-pay | Admitting: Family Medicine

## 2019-10-20 DIAGNOSIS — I10 Essential (primary) hypertension: Secondary | ICD-10-CM

## 2019-11-24 ENCOUNTER — Other Ambulatory Visit: Payer: Self-pay | Admitting: Sports Medicine

## 2019-11-24 DIAGNOSIS — I10 Essential (primary) hypertension: Secondary | ICD-10-CM

## 2019-11-24 MED ORDER — METOPROLOL SUCCINATE ER 100 MG PO TB24: 100.0000 mg | ORAL_TABLET | Freq: Every day | ORAL | 0 refills | Status: DC

## 2019-12-11 ENCOUNTER — Other Ambulatory Visit: Payer: Self-pay | Admitting: Osteopathic Medicine

## 2019-12-11 DIAGNOSIS — K2100 Gastro-esophageal reflux disease with esophagitis, without bleeding: Secondary | ICD-10-CM

## 2019-12-13 ENCOUNTER — Other Ambulatory Visit: Payer: Self-pay

## 2019-12-13 DIAGNOSIS — I10 Essential (primary) hypertension: Secondary | ICD-10-CM

## 2019-12-13 MED ORDER — LISINOPRIL 30 MG PO TABS: 30.0000 mg | ORAL_TABLET | Freq: Every day | ORAL | 0 refills | Status: DC

## 2019-12-13 NOTE — Telephone Encounter (Signed)
Damon Conrad has an appointment scheduled for tomorrow. He needs a refill of the blood pressure medication today.

## 2019-12-13 NOTE — Telephone Encounter (Signed)
Patient called back and is aware that his BP medication has been sent to [facility].  No other questions.

## 2019-12-14 ENCOUNTER — Ambulatory Visit: Payer: Commercial Managed Care - PPO | Admitting: Family Medicine

## 2019-12-15 ENCOUNTER — Encounter: Payer: Self-pay | Admitting: Family Medicine

## 2019-12-15 ENCOUNTER — Ambulatory Visit (INDEPENDENT_AMBULATORY_CARE_PROVIDER_SITE_OTHER): Payer: Commercial Managed Care - PPO | Admitting: Family Medicine

## 2019-12-15 ENCOUNTER — Other Ambulatory Visit: Payer: Self-pay

## 2019-12-15 DIAGNOSIS — E782 Mixed hyperlipidemia: Secondary | ICD-10-CM | POA: Diagnosis not present

## 2019-12-15 DIAGNOSIS — I10 Essential (primary) hypertension: Secondary | ICD-10-CM

## 2019-12-15 DIAGNOSIS — K2100 Gastro-esophageal reflux disease with esophagitis, without bleeding: Secondary | ICD-10-CM | POA: Diagnosis not present

## 2019-12-15 DIAGNOSIS — M1A9XX0 Chronic gout, unspecified, without tophus (tophi): Secondary | ICD-10-CM

## 2019-12-15 MED ORDER — METOPROLOL SUCCINATE ER 100 MG PO TB24: 100.0000 mg | ORAL_TABLET | Freq: Every day | ORAL | 2 refills | Status: DC

## 2019-12-15 MED ORDER — OMEPRAZOLE 40 MG PO CPDR: 40.0000 mg | DELAYED_RELEASE_CAPSULE | Freq: Every day | ORAL | 2 refills | Status: DC

## 2019-12-15 MED ORDER — ATORVASTATIN CALCIUM 40 MG PO TABS: 40.0000 mg | ORAL_TABLET | Freq: Every day | ORAL | 2 refills | Status: DC

## 2019-12-15 NOTE — Patient Instructions (Addendum)
Nice to meet you today! Restart omeprazole daily for heartburn.  Restart metoprolol for blood pressure See me again in 6 months.  We'll check labs at that visit.    Managing Your Hypertension Hypertension is commonly called high blood pressure. This is when the force of your blood pressing against the walls of your arteries is too strong. Arteries are blood vessels that carry blood from your heart throughout your body. Hypertension forces the heart to work harder to pump blood, and may cause the arteries to become narrow or stiff. Having untreated or uncontrolled hypertension can cause heart attack, stroke, kidney disease, and other problems. What are blood pressure readings? A blood pressure reading consists of a higher number over a lower number. Ideally, your blood pressure should be below 120/80. The first ("top") number is called the systolic pressure. It is a measure of the pressure in your arteries as your heart beats. The second ("bottom") number is called the diastolic pressure. It is a measure of the pressure in your arteries as the heart relaxes. What does my blood pressure reading mean? Blood pressure is classified into four stages. Based on your blood pressure reading, your health care provider may use the following stages to determine what type of treatment you need, if any. Systolic pressure and diastolic pressure are measured in a unit called mm Hg. Normal  Systolic pressure: below 120.  Diastolic pressure: below 80. Elevated  Systolic pressure: 120-129.  Diastolic pressure: below 80. Hypertension stage 1  Systolic pressure: 130-139.  Diastolic pressure: 80-89. Hypertension stage 2  Systolic pressure: 140 or above.  Diastolic pressure: 90 or above. What health risks are associated with hypertension? Managing your hypertension is an important responsibility. Uncontrolled hypertension can lead to:  A heart attack.  A stroke.  A weakened blood vessel  (aneurysm).  Heart failure.  Kidney damage.  Eye damage.  Metabolic syndrome.  Memory and concentration problems. What changes can I make to manage my hypertension? Hypertension can be managed by making lifestyle changes and possibly by taking medicines. Your health care provider will help you make a plan to bring your blood pressure within a normal range. Eating and drinking   Eat a diet that is high in fiber and potassium, and low in salt (sodium), added sugar, and fat. An example eating plan is called the DASH (Dietary Approaches to Stop Hypertension) diet. To eat this way: ? Eat plenty of fresh fruits and vegetables. Try to fill half of your plate at each meal with fruits and vegetables. ? Eat whole grains, such as whole wheat pasta, brown rice, or whole grain bread. Fill about one quarter of your plate with whole grains. ? Eat low-fat diary products. ? Avoid fatty cuts of meat, processed or cured meats, and poultry with skin. Fill about one quarter of your plate with lean proteins such as fish, chicken without skin, beans, eggs, and tofu. ? Avoid premade and processed foods. These tend to be higher in sodium, added sugar, and fat.  Reduce your daily sodium intake. Most people with hypertension should eat less than 1,500 mg of sodium a day.  Limit alcohol intake to no more than 1 drink a day for nonpregnant women and 2 drinks a day for men. One drink equals 12 oz of beer, 5 oz of wine, or 1 oz of hard liquor. Lifestyle  Work with your health care provider to maintain a healthy body weight, or to lose weight. Ask what an ideal weight is for you.  Get at least 30 minutes of exercise that causes your heart to beat faster (aerobic exercise) most days of the week. Activities may include walking, swimming, or biking.  Include exercise to strengthen your muscles (resistance exercise), such as weight lifting, as part of your weekly exercise routine. Try to do these types of exercises for  30 minutes at least 3 days a week.  Do not use any products that contain nicotine or tobacco, such as cigarettes and e-cigarettes. If you need help quitting, ask your health care provider.  Control any long-term (chronic) conditions you have, such as high cholesterol or diabetes. Monitoring  Monitor your blood pressure at home as told by your health care provider. Your personal target blood pressure may vary depending on your medical conditions, your age, and other factors.  Have your blood pressure checked regularly, as often as told by your health care provider. Working with your health care provider  Review all the medicines you take with your health care provider because there may be side effects or interactions.  Talk with your health care provider about your diet, exercise habits, and other lifestyle factors that may be contributing to hypertension.  Visit your health care provider regularly. Your health care provider can help you create and adjust your plan for managing hypertension. Will I need medicine to control my blood pressure? Your health care provider may prescribe medicine if lifestyle changes are not enough to get your blood pressure under control, and if:  Your systolic blood pressure is 130 or higher.  Your diastolic blood pressure is 80 or higher. Take medicines only as told by your health care provider. Follow the directions carefully. Blood pressure medicines must be taken as prescribed. The medicine does not work as well when you skip doses. Skipping doses also puts you at risk for problems. Contact a health care provider if:  You think you are having a reaction to medicines you have taken.  You have repeated (recurrent) headaches.  You feel dizzy.  You have swelling in your ankles.  You have trouble with your vision. Get help right away if:  You develop a severe headache or confusion.  You have unusual weakness or numbness, or you feel faint.  You have  severe pain in your chest or abdomen.  You vomit repeatedly.  You have trouble breathing. Summary  Hypertension is when the force of blood pumping through your arteries is too strong. If this condition is not controlled, it may put you at risk for serious complications.  Your personal target blood pressure may vary depending on your medical conditions, your age, and other factors. For most people, a normal blood pressure is less than 120/80.  Hypertension is managed by lifestyle changes, medicines, or both. Lifestyle changes include weight loss, eating a healthy, low-sodium diet, exercising more, and limiting alcohol. This information is not intended to replace advice given to you by your health care provider. Make sure you discuss any questions you have with your health care provider. Document Revised: 02/10/2019 Document Reviewed: 09/16/2016 Elsevier Patient Education  [place].

## 2019-12-15 NOTE — Assessment & Plan Note (Signed)
Blood pressure is not at goal at for age and co-morbidities.  I recommend he restart toprol xl, medication refilled.   In addition they were instructed to follow a low sodium diet with regular exercise to help to maintain adequate control of blood pressure.

## 2019-12-15 NOTE — Assessment & Plan Note (Signed)
Discussed recommendations to restart atorvastatin.  Rx refilled.  Recheck lipids in 6 months.

## 2019-12-15 NOTE — Assessment & Plan Note (Addendum)
GERD is not well controlled at this time.  I will have him restart omeprazole  Discussed avoidance of any NSAIDS.  Call if not improving with omeprazole. Red flag symptoms reviewed.

## 2019-12-15 NOTE — Assessment & Plan Note (Signed)
Denies recent flare.  Continue allopurinol  Colchicine and/or prednisone as needed for flares.

## 2019-12-15 NOTE — Progress Notes (Signed)
Damon Conrad - 54 y.o. male MRN [id number]  Date of birth: 07-16-1966  Subjective Chief Complaint  Patient presents with  . Hypertension  . Gastroesophageal Reflux    HPI Damon Conrad is a 54 y.o. male with history of HTN, HLD, GERD and gout here today for follow up visit.  He reports that he is feeling well today.    -HTN:  Current prescriptions include Toprol Xl and lisinopril.  Lisinopril refilled previously however he is not taking as he reports that he "felt funny" while taking this medication.  He is out of the Toprol as well.  He denies symptoms related to HTN including chest pain, headache, shortness of breath or vision changes.   -HLD:  Rx for atorvastatin.  He is not currently taking.  Last ldl 113.  Denies prior side effects.   -GERD:  Reports increased reflux symptoms including some nausea.  He is not currently taking his omeprazole as he is out of this medication.  He denies dark stools.    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  He is taking allopurinol and has colchicine and/or prednisone as needed for flares.  Denies recent flare.   ROS:  A comprehensive ROS was completed and negative except as noted per HPI  Allergies  Allergen Reactions  . Meloxicam     rash  . Lisinopril Other (See Comments)    Patient reports "makes him feel off".     Past Medical History:  Diagnosis Date  . GERD (gastroesophageal reflux disease)   . HTN (hypertension), benign     Past Surgical History:  Procedure Laterality Date  . ABDOMINAL HERNIA REPAIR Bilateral    x 2    Social History   Socioeconomic History  . Marital status: Single    Spouse name: Not on file  . Number of children: Not on file  . Years of education: Not on file  . Highest education level: Not on file  Occupational History  . Not on file  Tobacco Use  . Smoking status: Never Smoker  . Smokeless tobacco: Never Used  Substance and Sexual Activity  . Alcohol use: Yes   Comment: Rarely  . Drug use: No  . Sexual activity: Yes    Partners: Female  Other Topics Concern  . Not on file  Social History Narrative  . Not on file   Social Determinants of Health   Financial Resource Strain:   . Difficulty of Paying Living Expenses: Not on file  Food Insecurity:   . Worried About Programme researcher, broadcasting/film/video in the Last Year: Not on file  . Ran Out of Food in the Last Year: Not on file  Transportation Needs:   . Lack of Transportation (Medical): Not on file  . Lack of Transportation (Non-Medical): Not on file  Physical Activity:   . Days of Exercise per Week: Not on file  . Minutes of Exercise per Session: Not on file  Stress:   . Feeling of Stress : Not on file  Social Connections:   . Frequency of Communication with Friends and Family: Not on file  . Frequency of Social Gatherings with Friends and Family: Not on file  . Attends Religious Services: Not on file  . Active Member of Clubs or Organizations: Not on file  . Attends Banker Meetings: Not on file  . Marital Status: Not on file    Family History  Problem Relation Age of Onset  . Alcohol abuse Father   .  Cancer Mother   . Hypertension Mother     Health Maintenance  Topic Date Due  . Fecal DNA (Cologuard)  06/20/2014  . TETANUS/TDAP  09/20/2026 (Originally 06/21/1983)  . Hepatitis C Screening  Completed  . HIV Screening  Completed  . INFLUENZA VACCINE  Discontinued    ----------------------------------------------------------------------------------------------------------------------------------------------------------------------------------------------------------------- Physical Exam BP (!) 147/95   Pulse 77   Temp 98.1 F (36.7 C) (Oral)   Ht 5\' 7"  (1.702 m)   Wt 211 lb (95.7 kg)   BMI 33.05 kg/m   Physical Exam Constitutional:      Appearance: Normal appearance.  HENT:     Mouth/Throat:     Mouth: Mucous membranes are moist.  Eyes:     General: No scleral  icterus. Cardiovascular:     Rate and Rhythm: Normal rate and regular rhythm.  Pulmonary:     Effort: Pulmonary effort is normal.     Breath sounds: Normal breath sounds.  Musculoskeletal:     Cervical back: Neck supple.  Skin:    General: Skin is warm and dry.  Neurological:     General: No focal deficit present.     Mental Status: He is alert.     ------------------------------------------------------------------------------------------------------------------------------------------------------------------------------------------------------------------- Assessment and Plan  Essential hypertension, benign Blood pressure is not at goal at for age and co-morbidities.  I recommend he restart toprol xl, medication refilled.   In addition they were instructed to follow a low sodium diet with regular exercise to help to maintain adequate control of blood pressure.    Chronic gout Denies recent flare.  Continue allopurinol  Colchicine and/or prednisone as needed for flares.   HLD (hyperlipidemia) Discussed recommendations to restart atorvastatin.  Rx refilled.  Recheck lipids in 6 months.   GERD (gastroesophageal reflux disease) GERD is not well controlled at this time.  I will have him restart omeprazole  Discussed avoidance of any NSAIDS.  Call if not improving with omeprazole. Red flag symptoms reviewed.      This visit occurred during the SARS-CoV-2 public health emergency.  Safety protocols were in place, including screening questions prior to the visit, additional usage of staff PPE, and extensive cleaning of exam room while observing appropriate contact time as indicated for disinfecting solutions.

## 2020-06-01 ENCOUNTER — Emergency Department
Admission: EM | Admit: 2020-06-01 | Discharge: 2020-06-01 | Disposition: A | Discharge status: Home / Self Care | Payer: Commercial Managed Care - PPO | Source: Home / Self Care

## 2020-06-01 ENCOUNTER — Other Ambulatory Visit: Payer: Self-pay

## 2020-06-01 ENCOUNTER — Encounter: Payer: Self-pay | Admitting: Emergency Medicine

## 2020-06-01 DIAGNOSIS — J019 Acute sinusitis, unspecified: Secondary | ICD-10-CM

## 2020-06-01 DIAGNOSIS — J069 Acute upper respiratory infection, unspecified: Secondary | ICD-10-CM | POA: Diagnosis not present

## 2020-06-01 HISTORY — DX: Gout, unspecified: M10.9

## 2020-06-01 HISTORY — DX: Hyperlipidemia, unspecified: E78.5

## 2020-06-01 MED ORDER — IPRATROPIUM BROMIDE 0.06 % NA SOLN
2.0000 | Freq: Four times a day (QID) | NASAL | 1 refills | Status: DC
Start: 2020-06-01 — End: 2021-08-07

## 2020-06-01 MED ORDER — AMOXICILLIN 500 MG PO CAPS
500.0000 mg | ORAL_CAPSULE | Freq: Three times a day (TID) | ORAL | 0 refills | Status: DC
Start: 2020-06-01 — End: 2020-06-24

## 2020-06-01 MED ORDER — ACETAMINOPHEN 500 MG PO TABS
1000.0000 mg | ORAL_TABLET | Freq: Once | ORAL | Status: AC
  Administered 2020-06-01: 1000 mg via ORAL

## 2020-06-01 NOTE — ED Provider Notes (Signed)
[name]    CSN: [id number] Arrival date & time: 06/01/20  1522      History   Chief Complaint Chief Complaint  Patient presents with  . Nasal Congestion  . Cough  . Facial Pain    HPI Tallon Gertz is a 54 y.o. male.   HPI  Arran Fessel is a 54 y.o. male presenting to UC with c/o sudden onset facial pain with sinus congestion, mildly productive cough from post-nasal drip, fever in triage of 101.1*F.  His wife was sick with similar symptoms this past week, improved after about 4 days.  Pt has not received the Covid vaccine. Denies chest pain or SOB. Denies n/v/d. He took OTC cough/cold medication yesterday but no medication taken today.   Past Medical History:  Diagnosis Date  . GERD (gastroesophageal reflux disease)   . Gout   . HTN (hypertension), benign   . Hyperlipidemia     Patient Active Problem List   Diagnosis Date Noted  . 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 BMI 31.0-31.9,adult 12/16/2017  . 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 Plantar fasciitis, right 06/15/2016  . Chronic gout 02/21/2016  . Right lumbar radiculopathy 02/25/2015  . HLD (hyperlipidemia) 05/08/2014  . Essential hypertension, benign 05/07/2014  . GERD (gastroesophageal reflux disease) 05/07/2014    Past Surgical History:  Procedure Laterality Date  . ABDOMINAL HERNIA REPAIR Bilateral    x 2  . HERNIA REPAIR         Home Medications    Prior to Admission medications   Medication Sig Start Date End Date Taking? Authorizing Provider  allopurinol (ZYLOPRIM) 300 MG tablet Take 1 tablet (300 mg total) by mouth daily. To prevent gout 05/02/19   Rodolph Bong, MD  amoxicillin (AMOXIL) 500 MG capsule Take 1 capsule (500 mg total) by mouth 3 (three) times daily. 06/01/20   Lurene Shadow, PA-C  atorvastatin (LIPITOR) 40 MG tablet Take 1 tablet (40 mg total) by mouth daily. 12/15/19   Everrett Coombe, DO  baclofen (LIORESAL) 10 MG tablet Take 1 tablet (10 mg  total) by mouth at bedtime as needed for muscle spasms. 05/03/18   Rodolph Bong, MD  colchicine (COLCRYS) 0.6 MG tablet Take 1 tablet (0.6 mg total) by mouth daily. Patient not taking: Reported on 12/15/2019 12/16/17   Rodolph Bong, MD  diclofenac sodium (VOLTAREN) 1 % GEL Apply 4 g topically 4 (four) times daily. To affected joint. 05/01/19   Rodolph Bong, MD  ipratropium (ATROVENT) 0.06 % nasal spray Place 2 sprays into both nostrils 4 (four) times daily. 06/01/20   Lurene Shadow, PA-C  metoprolol succinate (TOPROL-XL) 100 MG 24 hr tablet Take 1 tablet (100 mg total) by mouth daily. Take with or immediately following a meal. 12/15/19   Everrett Coombe, DO  omeprazole (PRILOSEC) 40 MG capsule Take 1 capsule (40 mg total) by mouth daily. 12/15/19   Everrett Coombe, DO  predniSONE (DELTASONE) 50 MG tablet TAKE 1 TABLET BY MOUTH ONCE DAILY AS NEEDED FOR GOUT FLAIR 09/01/19   Rodolph Bong, MD  triamcinolone (NASACORT) 55 MCG/ACT AERO nasal inhaler Place 2 sprays into the nose daily. 01/20/19   Monica Becton, MD    Family History Family History  Problem Relation Age of Onset  . Alcohol abuse Father   . Cancer Mother   . Hypertension Mother     Social History Social History   Tobacco Use  . Smoking status: Never Smoker  .  Smokeless tobacco: Never Used  Substance Use Topics  . Alcohol use: Yes    Comment: Rarely  . Drug use: No     Allergies   Meloxicam and Lisinopril   Review of Systems Review of Systems  Constitutional: Positive for fever. Negative for chills.  HENT: Positive for congestion, sinus pressure, sinus pain and sore throat. Negative for ear pain, trouble swallowing and voice change.   Respiratory: Positive for cough. Negative for shortness of breath.   Cardiovascular: Negative for chest pain and palpitations.  Gastrointestinal: Negative for abdominal pain, diarrhea, nausea and vomiting.  Musculoskeletal: Positive for arthralgias, back pain and myalgias.  Skin:  Negative for rash.  Neurological: Positive for headaches. Negative for dizziness and light-headedness.  All other systems reviewed and are negative.    Physical Exam Triage Vital Signs ED Triage Vitals  Enc Vitals Group     BP 06/01/20 1537 (!) 148/91     Pulse Rate 06/01/20 1537 78     Resp 06/01/20 1537 18     Temp 06/01/20 1537 (!) 101.1 F (38.4 C)     Temp Source 06/01/20 1537 Oral     SpO2 06/01/20 1537 97 %     Weight 06/01/20 1542 (!) 205 lb (93 kg)     Height 06/01/20 1542 5\' 7"  (1.702 m)     Head Circumference --      Peak Flow --      Pain Score 06/01/20 1541 7     Pain Loc --      Pain Edu? --      Excl. in GC? --    No data found.  Updated Vital Signs BP (!) 148/91 (BP Location: Right Arm)   Pulse 78   Temp (!) 101.1 F (38.4 C) (Oral)   Resp 18   Ht 5\' 7"  (1.702 m)   Wt (!) 205 lb (93 kg)   SpO2 97%   BMI 32.11 kg/m   Visual Acuity Right Eye Distance:   Left Eye Distance:   Bilateral Distance:    Right Eye Near:   Left Eye Near:    Bilateral Near:     Physical Exam Vitals and nursing note reviewed.  Constitutional:      General: He is not in acute distress.    Appearance: Normal appearance. He is well-developed. He is not ill-appearing, toxic-appearing or diaphoretic.  HENT:     Head: Normocephalic and atraumatic.     Right Ear: Tympanic membrane and ear canal normal.     Left Ear: Tympanic membrane and ear canal normal.     Nose: Mucosal edema present.     Right Sinus: Maxillary sinus tenderness and frontal sinus tenderness present.     Left Sinus: Maxillary sinus tenderness and frontal sinus tenderness present.     Mouth/Throat:     Lips: Pink.     Mouth: Mucous membranes are moist.     Pharynx: Oropharynx is clear. Uvula midline.  Cardiovascular:     Rate and Rhythm: Normal rate and regular rhythm.  Pulmonary:     Effort: Pulmonary effort is normal. No respiratory distress.     Breath sounds: Normal breath sounds. No stridor. No  wheezing, rhonchi or rales.  Musculoskeletal:        General: Normal range of motion.     Cervical back: Normal range of motion.  Skin:    General: Skin is warm and dry.  Neurological:     Mental Status: He is alert and oriented  to person, place, and time.  Psychiatric:        Behavior: Behavior normal.      UC Treatments / Results  Labs (all labs ordered are listed, but only abnormal results are displayed) Labs Reviewed  SARS-COV-2 RNA,(COVID-19) QUALITATIVE NAAT    EKG   Radiology No results found.  Procedures Procedures (including critical care time)  Medications Ordered in UC Medications  acetaminophen (TYLENOL) tablet 1,000 mg (1,000 mg Oral Given 06/01/20 1549)    Initial Impression / Assessment and Plan / UC Course  I have reviewed the triage vital signs and the nursing notes.  Pertinent labs & imaging results that were available during my care of the patient were reviewed by me and considered in my medical decision making (see chart for details).     Sudden onset of symptoms c/w viral illness Covid test: PENDING Encouraged symptomatic tx F/u with PCP this week if not improving AVS provided.  Final Clinical Impressions(s) / UC Diagnoses   Final diagnoses:  Acute upper respiratory infection  Acute rhinosinusitis     Discharge Instructions      You may take 500mg  acetaminophen every 4-6 hours or in combination with ibuprofen 400-600mg  every 6-8 hours as needed for pain, inflammation, and fever.  Be sure to well hydrated with clear liquids and get at least 8 hours of sleep at night, preferably more while sick.   Please follow up with family medicine in 1 week if needed.  Your symptoms are likely due to a virus, an antibiotic will not help, however, if your fever persists for 3 days, facial pain or cough worsen, or you are not improving in 1 week, you may start taking the antibiotic, amoxicillin.  Your covid test should be back by Monday. You will  be notified if the results are Positive. Until then, you and members of your household should stay isolated at home. If you MUST go out, always wear a face mask. Keep your distance from others. Wash hands frequently.     ED Prescriptions    Medication Sig Dispense Auth. Provider   ipratropium (ATROVENT) 0.06 % nasal spray Place 2 sprays into both nostrils 4 (four) times daily. 15 mL Friday, Kolston Lacount O, PA-C   amoxicillin (AMOXIL) 500 MG capsule Take 1 capsule (500 mg total) by mouth 3 (three) times daily. 21 capsule 06-14-1987, Lurene Shadow     PDMP not reviewed this encounter.   [place], PA-C 06/03/20 1004

## 2020-06-01 NOTE — ED Triage Notes (Signed)
Patient reports facial pain, congestion and cough since yesterday; no known fever. Has not had covid vaccination.

## 2020-06-01 NOTE — Discharge Instructions (Signed)
  You may take 500mg  acetaminophen every 4-6 hours or in combination with ibuprofen 400-600mg  every 6-8 hours as needed for pain, inflammation, and fever.  Be sure to well hydrated with clear liquids and get at least 8 hours of sleep at night, preferably more while sick.   Please follow up with family medicine in 1 week if needed.  Your symptoms are likely due to a virus, an antibiotic will not help, however, if your fever persists for 3 days, facial pain or cough worsen, or you are not improving in 1 week, you may start taking the antibiotic, amoxicillin.  Your covid test should be back by Monday. You will be notified if the results are Positive. Until then, you and members of your household should stay isolated at home. If you MUST go out, always wear a face mask. Keep your distance from others. Wash hands frequently.

## 2020-06-03 ENCOUNTER — Telehealth: Payer: Self-pay | Admitting: Emergency Medicine

## 2020-06-03 NOTE — Telephone Encounter (Signed)
Call from [facility] for COVID test results - confirmed name & DOB - in process - no results at this time

## 2020-06-04 LAB — SARS-COV-2 RNA,(COVID-19) QUALITATIVE NAAT: SARS CoV2 RNA: DETECTED — AB

## 2020-06-08 ENCOUNTER — Emergency Department (INDEPENDENT_AMBULATORY_CARE_PROVIDER_SITE_OTHER)
Admission: EM | Admit: 2020-06-08 | Discharge: 2020-06-08 | Disposition: A | Discharge status: Home / Self Care | Payer: Commercial Managed Care - PPO | Source: Home / Self Care

## 2020-06-08 ENCOUNTER — Encounter: Payer: Self-pay | Admitting: Emergency Medicine

## 2020-06-08 DIAGNOSIS — R748 Abnormal levels of other serum enzymes: Secondary | ICD-10-CM | POA: Insufficient documentation

## 2020-06-08 DIAGNOSIS — U071 COVID-19: Secondary | ICD-10-CM | POA: Insufficient documentation

## 2020-06-08 DIAGNOSIS — R059 Cough, unspecified: Secondary | ICD-10-CM

## 2020-06-08 DIAGNOSIS — R0902 Hypoxemia: Secondary | ICD-10-CM | POA: Diagnosis not present

## 2020-06-08 DIAGNOSIS — J029 Acute pharyngitis, unspecified: Secondary | ICD-10-CM

## 2020-06-08 NOTE — ED Notes (Signed)
Patient is being discharged from the Urgent Care and sent to the Emergency Department via POV w/wife . Per Waylan Rocher, PA-C, patient is in need of higher level of care due to decreased O2 sats on RA (86%).Patient is COVID + Patient is aware and verbalizes understanding of plan of care.  Vitals:   06/08/20 1430 06/08/20 1441  BP:    Pulse: (!) 102 (!) 102  Temp:    SpO2: 93% 99%  temp was 100.5 on arrival - tylenol at home (1000mg  at 1330) per patient O2 sats on arrival 86% -increased to 93% on 4 LNC.

## 2020-06-08 NOTE — Discharge Instructions (Signed)
  You have declined EMS transport, however, your oxygen is dangerously low. It should be at least above 90%, preferably 94% and up.  Please have your wife drive you directly to [facility] Emergency Department for further evaluation and treatment of your symptoms from Covid-19.

## 2020-06-08 NOTE — ED Notes (Signed)
Patient placed on O2 per orders after provider notified of O2 sat on RA of 86%. Pt had a positive COVID test earlier this week per patient. O2 sats increased to 93% on 4L [place]. Pt has a pulse ox at home & was reading the O2 saturation as the HR and vice versa. O2 saturation last night per patient was 86% w/ HR of 98.

## 2020-06-08 NOTE — ED Triage Notes (Signed)
Patient was seen last week, dx with COVID, cough, fever, SOB, taken Tylenol, requesting meds for cough.

## 2020-06-08 NOTE — ED Provider Notes (Signed)
[facility]    CSN: [id number] Arrival date & time: 06/08/20  1301      History   Chief Complaint Chief Complaint  Patient presents with  . Cough    HPI Damon Conrad is a 54 y.o. male.   HPI Damon Conrad is a 54 y.o. male presenting to UC with c/o worsening cough, fatigue, sore throat and bilateral chest wall soreness he believes is from his coughing. Fever up to 101*F. He took Tylenol 1g at 1:30PM.  He was seen at [facility] on 06/01/20 for URI and sinusitis.  He did test positive for Covid-19 earlier in the week.  He used his pulse-ox machine last night, initially thought his oxygen was 98% and his pulse was 86% but when compared to device today, pt states he had the numbers switched around and his O2 was in the 80s last night. He is fatigued and winded while walking, not normal for pt. No hx of asthma but he does smoke cigarettes. He was not vaccinated for Covid-19.  His wife had URI symptoms about 2 weeks ago but is better now.    Past Medical History:  Diagnosis Date  . GERD (gastroesophageal reflux disease)   . Gout   . HTN (hypertension), benign   . Hyperlipidemia     Patient Active Problem List   Diagnosis Date Noted  . 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 BMI 31.0-31.9,adult 12/16/2017  . 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 Plantar fasciitis, right 06/15/2016  . Chronic gout 02/21/2016  . Right lumbar radiculopathy 02/25/2015  . HLD (hyperlipidemia) 05/08/2014  . Essential hypertension, benign 05/07/2014  . GERD (gastroesophageal reflux disease) 05/07/2014    Past Surgical History:  Procedure Laterality Date  . ABDOMINAL HERNIA REPAIR Bilateral    x 2  . HERNIA REPAIR         Home Medications    Prior to Admission medications   Medication Sig Start Date End Date Taking? Authorizing Provider  allopurinol (ZYLOPRIM) 300 MG tablet Take 1 tablet (300 mg total) by mouth daily. To prevent gout 05/02/19  Yes Rodolph Bong, MD    amoxicillin (AMOXIL) 500 MG capsule Take 1 capsule (500 mg total) by mouth 3 (three) times daily. 06/01/20  Yes Aaradhya Kysar O, PA-C  atorvastatin (LIPITOR) 40 MG tablet Take 1 tablet (40 mg total) by mouth daily. 12/15/19  Yes Everrett Coombe, DO  baclofen (LIORESAL) 10 MG tablet Take 1 tablet (10 mg total) by mouth at bedtime as needed for muscle spasms. 05/03/18  Yes Rodolph Bong, MD  colchicine (COLCRYS) 0.6 MG tablet Take 1 tablet (0.6 mg total) by mouth daily. 12/16/17  Yes Rodolph Bong, MD  diclofenac sodium (VOLTAREN) 1 % GEL Apply 4 g topically 4 (four) times daily. To affected joint. 05/01/19  Yes Rodolph Bong, MD  ipratropium (ATROVENT) 0.06 % nasal spray Place 2 sprays into both nostrils 4 (four) times daily. 06/01/20  Yes Abygale Karpf O, PA-C  metoprolol succinate (TOPROL-XL) 100 MG 24 hr tablet Take 1 tablet (100 mg total) by mouth daily. Take with or immediately following a meal. 12/15/19  Yes Everrett Coombe, DO  omeprazole (PRILOSEC) 40 MG capsule Take 1 capsule (40 mg total) by mouth daily. 12/15/19  Yes Everrett Coombe, DO  predniSONE (DELTASONE) 50 MG tablet TAKE 1 TABLET BY MOUTH ONCE DAILY AS NEEDED FOR GOUT FLAIR 09/01/19  Yes Rodolph Bong, MD  triamcinolone (NASACORT) 55 MCG/ACT AERO nasal inhaler Place 2 sprays  into the nose daily. 01/20/19  Yes Monica Becton, MD    Family History Family History  Problem Relation Age of Onset  . Alcohol abuse Father   . Cancer Mother   . Hypertension Mother     Social History Social History   Tobacco Use  . Smoking status: Never Smoker  . Smokeless tobacco: Never Used  Substance Use Topics  . Alcohol use: Yes    Comment: Rarely  . Drug use: No     Allergies   Meloxicam and Lisinopril   Review of Systems Review of Systems  Constitutional: Positive for fatigue and fever. Negative for chills.  HENT: Positive for congestion and sore throat. Negative for ear pain, trouble swallowing and voice change.   Respiratory:  Positive for cough and shortness of breath.   Cardiovascular: Positive for chest pain ( "my sides are sore from coughing"). Negative for palpitations.  Gastrointestinal: Negative for abdominal pain, diarrhea, nausea and vomiting.  Musculoskeletal: Negative for arthralgias, back pain and myalgias.  Skin: Negative for rash.  Neurological: Positive for weakness ( generalized) and headaches.  All other systems reviewed and are negative.    Physical Exam Triage Vital Signs ED Triage Vitals [06/08/20 1419]  Enc Vitals Group     BP (!) 147/93     Pulse Rate (!) 103     Resp      Temp (!) 100.5 F (38.1 C)     Temp Source Oral     SpO2 (!) 87 %     Weight      Height      Head Circumference      Peak Flow      Pain Score 8     Pain Loc      Pain Edu?      Excl. in GC?    No data found.  Updated Vital Signs BP (!) 147/93 (BP Location: Left Arm)   Pulse (!) 102   Temp (!) 100.5 F (38.1 C) (Oral)   SpO2 99%   Visual Acuity Right Eye Distance:   Left Eye Distance:   Bilateral Distance:    Right Eye Near:   Left Eye Near:    Bilateral Near:     Physical Exam Vitals and nursing note reviewed.  Constitutional:      Appearance: He is well-developed. He is ill-appearing.  HENT:     Head: Normocephalic and atraumatic.     Right Ear: Tympanic membrane and ear canal normal.     Left Ear: Tympanic membrane and ear canal normal.     Nose: Nose normal.     Mouth/Throat:     Mouth: Mucous membranes are moist.     Pharynx: Oropharynx is clear.  Cardiovascular:     Rate and Rhythm: Regular rhythm. Tachycardia present.  Pulmonary:     Effort: Pulmonary effort is normal.     Breath sounds: No stridor. Examination of the right-middle field reveals decreased breath sounds. Examination of the left-middle field reveals decreased breath sounds. Examination of the right-lower field reveals decreased breath sounds. Examination of the left-lower field reveals decreased breath sounds.  Decreased breath sounds present. No wheezing.     Comments: Nasal canula in place at 3L. Pt mildly winded between short phrases but no accessory muscle use while seated.  Musculoskeletal:        General: Normal range of motion.     Cervical back: Normal range of motion.  Skin:    General: Skin is warm  and dry.     Coloration: Skin is pale.  Neurological:     Mental Status: He is alert and oriented to person, place, and time.  Psychiatric:        Behavior: Behavior normal.      UC Treatments / Results  Labs (all labs ordered are listed, but only abnormal results are displayed) Labs Reviewed - No data to display  EKG   Radiology No results found.  Procedures Procedures (including critical care time)  Medications Ordered in UC Medications - No data to display  Initial Impression / Assessment and Plan / UC Course  I have reviewed the triage vital signs and the nursing notes.  Pertinent labs & imaging results that were available during my care of the patient were reviewed by me and considered in my medical decision making (see chart for details).    Due to need for oxygen to get O2 Sat above 90%, advised pt he needs further evaluation and treatment of his Covid-19 symptoms in the hospital. Pt refused EMS transport but is agreeable to have his wife, who is waiting in parking lot, drive him to [facility]. Pt discharged in stable condition.   Final Clinical Impressions(s) / UC Diagnoses   Final diagnoses:  Hypoxia  COVID-19 virus infection  Cough  Sore throat     Discharge Instructions      You have declined EMS transport, however, your oxygen is dangerously low. It should be at least above 90%, preferably 94% and up.  Please have your wife drive you directly to [facility] Emergency Department for further evaluation and treatment of your symptoms from Covid-19.     ED Prescriptions    None     PDMP not reviewed this encounter.   Lurene Shadow, [place] 06/08/20 1545

## 2020-06-08 NOTE — ED Notes (Signed)
Call to [facility] triage to report on pt status - pt enroute to ED

## 2020-06-14 ENCOUNTER — Ambulatory Visit: Payer: Commercial Managed Care - PPO | Admitting: Family Medicine

## 2020-06-14 DIAGNOSIS — E871 Hypo-osmolality and hyponatremia: Secondary | ICD-10-CM | POA: Insufficient documentation

## 2020-06-17 ENCOUNTER — Telehealth: Payer: Self-pay | Admitting: Family Medicine

## 2020-06-17 NOTE — Telephone Encounter (Signed)
Patient called and wanted me to let his PCP- Dr.Matthews know that he is currently in the hospital at [facility] for now 2 weeks and he has Covid and Pneumonia. He feels like they are not getting him better and really wishes he went to [facility]. He was wondering if you can see his notes from the hospital and if you have any advice for him.

## 2020-06-20 ENCOUNTER — Telehealth: Payer: Self-pay | Admitting: Family Medicine

## 2020-06-20 NOTE — Telephone Encounter (Signed)
Patient called in stating that he was seen at [facility] in [place]. States he was seen for covid. He also had pneumonia in both lungs. Okay for in office visit?

## 2020-06-20 NOTE — Telephone Encounter (Signed)
We can do VV for follow up initially.  We can determine if he needs OV based on this.

## 2020-06-21 NOTE — Telephone Encounter (Signed)
Left voicemail for patient to call us back to make an appointment.  

## 2020-06-24 ENCOUNTER — Encounter: Payer: Self-pay | Admitting: Family Medicine

## 2020-06-24 ENCOUNTER — Telehealth (INDEPENDENT_AMBULATORY_CARE_PROVIDER_SITE_OTHER): Payer: Commercial Managed Care - PPO | Admitting: Family Medicine

## 2020-06-24 DIAGNOSIS — M1A9XX0 Chronic gout, unspecified, without tophus (tophi): Secondary | ICD-10-CM | POA: Diagnosis not present

## 2020-06-24 DIAGNOSIS — U071 COVID-19: Secondary | ICD-10-CM

## 2020-06-24 MED ORDER — PREDNISONE 50 MG PO TABS: ORAL_TABLET | ORAL | 0 refills | Status: DC

## 2020-06-24 MED ORDER — BENZONATATE 200 MG PO CAPS: 200.0000 mg | ORAL_CAPSULE | Freq: Three times a day (TID) | ORAL | 0 refills | Status: DC | PRN

## 2020-06-24 NOTE — Assessment & Plan Note (Signed)
Recommend continue supportive care at home.  He will continue home O2 for now.  Discussed gradual return to normal activity.  I will see him in clinic in about 1 week or sooner if having any worsening symptoms.

## 2020-06-24 NOTE — Progress Notes (Addendum)
Damon Conrad - 54 y.o. male MRN [id number]  Date of birth: August 02, 1966   This visit type was conducted due to national recommendations for restrictions regarding the COVID-19 Pandemic (e.g. social distancing).  This format is felt to be most appropriate for this patient at this time.  All issues noted in this document were discussed and addressed.  No physical exam was performed (except for noted visual exam findings with Video Visits).  I discussed the limitations of evaluation and management by telemedicine and the availability of in person appointments. The patient expressed understanding and agreed to proceed.  I connected with@ on 06/24/20 at  2:00 PM EDT by a video enabled telemedicine application and verified that I am speaking with the correct person using two identifiers.   Interactive audio and video telecommunications were attempted between this provider and patient, however failed, due to patient having technical difficulties OR patient did not have access to video capability.  We continued and completed visit with audio only.   Present at visit: Everrett Coombe, DO Josephina Gip   Patient Location: Home [address] [place] [address]   Provider location:   [facility]  Chief Complaint  Patient presents with   Hospitalization Follow-up    HPI  Damon Conrad is a 54 y.o. male who presents via audio/video conferencing for a telehealth visit today.  Patient following up today for recent hospitalization.  He was diagnosed with COVID-19 with associated pneumonia and hospitalized from 8/7-8/19/21.    He completed 10 day course of decadron and tociluzamab.  Symptoms improved with improvement of inflammatory markers.  He was weaned to 4L O2 which he continues on.  He reports that he still feels fatigued and gets winded with activity.  He also has had some bilateral ankle and foot swelling.  This is similar to previous gout flares.  He denies fever, chills, nausea, vomiting, diarrhea.  He  is eating an drinking normally.     ROS:  A comprehensive ROS was completed and negative except as noted per HPI  Past Medical History:  Diagnosis Date   GERD (gastroesophageal reflux disease)    Gout    HTN (hypertension), benign    Hyperlipidemia     Past Surgical History:  Procedure Laterality Date   ABDOMINAL HERNIA REPAIR Bilateral    x 2   HERNIA REPAIR      Family History  Problem Relation Age of Onset   Alcohol abuse Father    Cancer Mother    Hypertension Mother     Social History   Socioeconomic History   Marital status: Single    Spouse name: Not on file   Number of children: Not on file   Years of education: Not on file   Highest education level: Not on file  Occupational History   Not on file  Tobacco Use   Smoking status: Never Smoker   Smokeless tobacco: Never Used  Substance and Sexual Activity   Alcohol use: Yes    Comment: Rarely   Drug use: No   Sexual activity: Yes    Partners: Female  Other Topics Concern   Not on file  Social History Narrative   Not on file   Social Determinants of Health   Financial Resource Strain:    Difficulty of Paying Living Expenses: Not on file  Food Insecurity:    Worried About Running Out of Food in the Last Year: Not on file   [employer] of Food in the Last Year:  Not on file  Transportation Needs:    Lack of Transportation (Medical): Not on file   Lack of Transportation (Non-Medical): Not on file  Physical Activity:    Days of Exercise per Week: Not on file   Minutes of Exercise per Session: Not on file  Stress:    Feeling of Stress : Not on file  Social Connections:    Frequency of Communication with Friends and Family: Not on file   Frequency of Social Gatherings with Friends and Family: Not on file   Attends Religious Services: Not on file   Active Member of Clubs or Organizations: Not on file   Attends Banker Meetings: Not on file   Marital  Status: Not on file  Intimate Partner Violence:    Fear of Current or Ex-Partner: Not on file   Emotionally Abused: Not on file   Physically Abused: Not on file   Sexually Abused: Not on file     Current Outpatient Medications:    albuterol (VENTOLIN HFA) 108 (90 Base) MCG/ACT inhaler, Inhale into the lungs., Disp: , Rfl:    allopurinol (ZYLOPRIM) 300 MG tablet, Take 1 tablet (300 mg total) by mouth daily. To prevent gout, Disp: 90 tablet, Rfl: 1   atorvastatin (LIPITOR) 40 MG tablet, Take 1 tablet (40 mg total) by mouth daily., Disp: 90 tablet, Rfl: 2   baclofen (LIORESAL) 10 MG tablet, Take 1 tablet (10 mg total) by mouth at bedtime as needed for muscle spasms., Disp: 90 each, Rfl: 3   benzonatate (TESSALON) 200 MG capsule, Take 1 capsule (200 mg total) by mouth 3 (three) times daily as needed for cough., Disp: 30 capsule, Rfl: 0   colchicine (COLCRYS) 0.6 MG tablet, Take 1 tablet (0.6 mg total) by mouth daily., Disp: 90 tablet, Rfl: 1   diclofenac sodium (VOLTAREN) 1 % GEL, Apply 4 g topically 4 (four) times daily. To affected joint., Disp: 100 g, Rfl: 11   ipratropium (ATROVENT) 0.06 % nasal spray, Place 2 sprays into both nostrils 4 (four) times daily., Disp: 15 mL, Rfl: 1   metoprolol succinate (TOPROL-XL) 100 MG 24 hr tablet, Take 1 tablet (100 mg total) by mouth daily. Take with or immediately following a meal., Disp: 90 tablet, Rfl: 2   omeprazole (PRILOSEC) 40 MG capsule, Take 1 capsule (40 mg total) by mouth daily., Disp: 90 capsule, Rfl: 2   predniSONE (DELTASONE) 50 MG tablet, TAKE 1 TABLET BY MOUTH ONCE DAILY AS NEEDED FOR GOUT FLAIR, Disp: 30 tablet, Rfl: 0   triamcinolone (NASACORT) 55 MCG/ACT AERO nasal inhaler, Place 2 sprays into the nose daily., Disp: 1 Inhaler, Rfl: 11  EXAM:  VITALS per patient if applicable: Temp (!) 95.4 F (35.2 C) (Oral)    Wt 195 lb (88.5 kg)    SpO2 98%    BMI 30.54 kg/m   GENERAL: alert,  and in no acute distress   LUNGS:  on inspection no signs of respiratory distress,  no obvious gross SOB, gasping or wheezing   PSYCH/NEURO: pleasant and cooperative, no obvious depression or anxiety, speech and thought processing grossly intact  ASSESSMENT AND PLAN:  Discussed the following assessment and plan:  COVID-19 virus infection Recommend continue supportive care at home.  He will continue home O2 for now.  Discussed gradual return to normal activity.  I will see him in clinic in about 1 week or sooner if having any worsening symptoms.    Chronic gout Having acute gout flare. Start prednisone 50mg   daily as needed.       I discussed the assessment and treatment plan with the patient. The patient was provided an opportunity to ask questions and all were answered. The patient agreed with the plan and demonstrated an understanding of the instructions.   The patient was advised to call back or seek an in-person evaluation if the symptoms worsen or if the condition fails to improve as anticipated.  30 minutes spent including pre visit preparation, review of prior notes and labs, encounter with patient and same day documentation.    Everrett Coombe, DO

## 2020-06-24 NOTE — Assessment & Plan Note (Signed)
Having acute gout flare. Start prednisone 50mg  daily as needed.

## 2020-06-24 NOTE — Progress Notes (Signed)
Both feet swollen since Saturday. He has gout but it had gone away. Dr. Denyse Amass had given him prednisone before.

## 2020-06-30 ENCOUNTER — Telehealth: Payer: Self-pay | Admitting: Emergency Medicine

## 2020-06-30 NOTE — Telephone Encounter (Signed)
Patient had called yesterday about needing something for bloody nose from dryness due to nasal canula; states went to pharmacy and was given OTC that is working.

## 2020-07-01 ENCOUNTER — Ambulatory Visit (INDEPENDENT_AMBULATORY_CARE_PROVIDER_SITE_OTHER): Payer: Commercial Managed Care - PPO

## 2020-07-01 ENCOUNTER — Ambulatory Visit (INDEPENDENT_AMBULATORY_CARE_PROVIDER_SITE_OTHER): Payer: Commercial Managed Care - PPO | Admitting: Family Medicine

## 2020-07-01 ENCOUNTER — Other Ambulatory Visit: Payer: Self-pay

## 2020-07-01 ENCOUNTER — Encounter: Payer: Self-pay | Admitting: Family Medicine

## 2020-07-01 VITALS — BP 123/85 | HR 108 | Wt 192.0 lb

## 2020-07-01 DIAGNOSIS — U071 COVID-19: Secondary | ICD-10-CM

## 2020-07-01 MED ORDER — BENZONATATE 200 MG PO CAPS: 200.0000 mg | ORAL_CAPSULE | Freq: Three times a day (TID) | ORAL | 0 refills | Status: DC | PRN

## 2020-07-01 MED ORDER — TRIAMCINOLONE ACETONIDE 0.1 % EX CREA
1.0000 | TOPICAL_CREAM | Freq: Two times a day (BID) | CUTANEOUS | 0 refills | Status: DC
Start: 2020-07-01 — End: 2020-07-01

## 2020-07-01 NOTE — Patient Instructions (Signed)
Continue home oxygen for now.  Try to limit activity to early morning or late in the evening if you need to be outside.  Have updated labs and xray completed.  See me again in 1 week.

## 2020-07-01 NOTE — Assessment & Plan Note (Addendum)
-  He is able to maintain O2 sats at 2L/min with nadir of 91% on ambulation.  Continue at 2L/min. Discussed that he may increase as needed.  -Repeat xray ordered -Check CMP/CBC.  -Follow up in 7-10 days.

## 2020-07-01 NOTE — Progress Notes (Signed)
Cyrus Ramsburg - 54 y.o. male MRN [id number]  Date of birth: 1966-10-09  Subjective Chief Complaint  Patient presents with  . Post COVID complications    HPI Zeven Kocak is a 54 y.o. male here today for follow up of COVID-19.  Additional co-morbidities include HTN.  He continues on home O2 at 3-4 L/min.  He still gets pretty winded if walking around.  He is eating and drinking normally.  He denies fever, chills, extremity swelling.   TTE completed in the hospital without structural or functional change.  He would like to have updated chest xray and renewal of cough mediation.    ROS:  A comprehensive ROS was completed and negative except as noted per HPI  Allergies  Allergen Reactions  . Meloxicam     rash  . Lisinopril Other (See Comments)    Patient reports "makes him feel off".     Past Medical History:  Diagnosis Date  . GERD (gastroesophageal reflux disease)   . Gout   . HTN (hypertension), benign   . Hyperlipidemia     Past Surgical History:  Procedure Laterality Date  . ABDOMINAL HERNIA REPAIR Bilateral    x 2  . HERNIA REPAIR      Social History   Socioeconomic History  . Marital status: Single    Spouse name: Not on file  . Number of children: Not on file  . Years of education: Not on file  . Highest education level: Not on file  Occupational History  . Not on file  Tobacco Use  . Smoking status: Never Smoker  . Smokeless tobacco: Never Used  Substance and Sexual Activity  . Alcohol use: Yes    Comment: Rarely  . Drug use: No  . Sexual activity: Yes    Partners: Female  Other Topics Concern  . Not on file  Social History Narrative  . Not on file   Social Determinants of Health   Financial Resource Strain:   . Difficulty of Paying Living Expenses: Not on file  Food Insecurity:   . Worried About Programme researcher, broadcasting/film/video in the Last Year: Not on file  . Ran Out of Food in the Last Year: Not on file  Transportation Needs:   . Lack of Transportation  (Medical): Not on file  . Lack of Transportation (Non-Medical): Not on file  Physical Activity:   . Days of Exercise per Week: Not on file  . Minutes of Exercise per Session: Not on file  Stress:   . Feeling of Stress : Not on file  Social Connections:   . Frequency of Communication with Friends and Family: Not on file  . Frequency of Social Gatherings with Friends and Family: Not on file  . Attends Religious Services: Not on file  . Active Member of Clubs or Organizations: Not on file  . Attends Banker Meetings: Not on file  . Marital Status: Not on file    Family History  Problem Relation Age of Onset  . Alcohol abuse Father   . Cancer Mother   . Hypertension Mother     Health Maintenance  Topic Date Due  . COVID-19 Vaccine (1) Never done  . Fecal DNA (Cologuard)  Never done  . TETANUS/TDAP  09/20/2026 (Originally 06/21/1983)  . Hepatitis C Screening  Completed  . HIV Screening  Completed  . INFLUENZA VACCINE  Discontinued     ----------------------------------------------------------------------------------------------------------------------------------------------------------------------------------------------------------------- Physical Exam BP 123/85 (BP Location: Left Arm, Patient Position: Sitting, Cuff Size:  Large)   Pulse (!) 108   Wt 192 lb (87.1 kg)   SpO2 91%   BMI 30.07 kg/m   Physical Exam Constitutional:      Appearance: Normal appearance.  HENT:     Head: Normocephalic and atraumatic.  Eyes:     General: No scleral icterus. Cardiovascular:     Rate and Rhythm: Normal rate and regular rhythm.  Pulmonary:     Effort: Pulmonary effort is normal.     Breath sounds: Normal breath sounds.  Musculoskeletal:     Cervical back: Neck supple.  Skin:    General: Skin is warm and dry.  Neurological:     General: No focal deficit present.     Mental Status: He is alert.  Psychiatric:        Mood and Affect: Mood normal.         Behavior: Behavior normal.     ------------------------------------------------------------------------------------------------------------------------------------------------------------------------------------------------------------------- Assessment and Plan  COVID-19 virus infection -He is able to maintain O2 sats at 2L/min with nadir of 91% on ambulation.  Continue at 2L/min. Discussed that he may increase as needed.  -Repeat xray ordered -Check CMP/CBC.  -Follow up in 7-10 days.     Meds ordered this encounter  Medications  . triamcinolone cream (KENALOG) 0.1 %    Sig: Apply 1 application topically 2 (two) times daily.    Dispense:  30 g    Refill:  0  . benzonatate (TESSALON) 200 MG capsule    Sig: Take 1 capsule (200 mg total) by mouth 3 (three) times daily as needed for cough.    Dispense:  30 capsule    Refill:  0    No follow-ups on file.    This visit occurred during the SARS-CoV-2 public health emergency.  Safety protocols were in place, including screening questions prior to the visit, additional usage of staff PPE, and extensive cleaning of exam room while observing appropriate contact time as indicated for disinfecting solutions.

## 2020-07-02 ENCOUNTER — Ambulatory Visit: Payer: Commercial Managed Care - PPO | Admitting: Family Medicine

## 2020-07-02 LAB — COMPLETE METABOLIC PANEL WITH GFR
AG Ratio: 1.6 (calc) (ref 1.0–2.5)
ALT: 43 U/L (ref 9–46)
AST: 20 U/L (ref 10–35)
Albumin: 4 g/dL (ref 3.6–5.1)
Alkaline phosphatase (APISO): 58 U/L (ref 35–144)
BUN: 17 mg/dL (ref 7–25)
CO2: 30 mmol/L (ref 20–32)
Calcium: 8.9 mg/dL (ref 8.6–10.3)
Chloride: 103 mmol/L (ref 98–110)
Creat: 0.94 mg/dL (ref 0.70–1.33)
GFR, Est African American: 105 mL/min/{1.73_m2} (ref >=60)
GFR, Est Non African American: 90 mL/min/{1.73_m2} (ref >=60)
Globulin: 2.5 g/dL (calc) (ref 1.9–3.7)
Glucose, Bld: 108 mg/dL (ref 65–139)
Potassium: 4.6 mmol/L (ref 3.5–5.3)
Sodium: 141 mmol/L (ref 135–146)
Total Bilirubin: 0.8 mg/dL (ref 0.2–1.2)
Total Protein: 6.5 g/dL (ref 6.1–8.1)

## 2020-07-02 LAB — CBC
HCT: 39 % (ref 38.5–50.0)
Hemoglobin: 13 g/dL — ABNORMAL LOW (ref 13.2–17.1)
MCH: 30.3 pg (ref 27.0–33.0)
MCHC: 33.3 g/dL (ref 32.0–36.0)
MCV: 90.9 fL (ref 80.0–100.0)
MPV: 10.6 fL (ref 7.5–12.5)
Platelets: 124 10*3/uL — ABNORMAL LOW (ref 140–400)
RBC: 4.29 10*6/uL (ref 4.20–5.80)
RDW: 14.3 % (ref 11.0–15.0)
WBC: 10.1 10*3/uL (ref 3.8–10.8)

## 2020-07-05 ENCOUNTER — Ambulatory Visit: Payer: Commercial Managed Care - PPO | Admitting: Family Medicine

## 2020-07-10 ENCOUNTER — Encounter: Payer: Self-pay | Admitting: Family Medicine

## 2020-07-10 ENCOUNTER — Ambulatory Visit (INDEPENDENT_AMBULATORY_CARE_PROVIDER_SITE_OTHER): Payer: Commercial Managed Care - PPO | Admitting: Family Medicine

## 2020-07-10 DIAGNOSIS — U071 COVID-19: Secondary | ICD-10-CM

## 2020-07-10 NOTE — Assessment & Plan Note (Signed)
Continued shortness of breath and cough.  Recommend delsym in addition to tessalon.  We did a trial off O2 today with him walking around clinic with desaturations to 88%.  He will continue at 2 L/min for now.  F F/u in 2 weeks.

## 2020-07-10 NOTE — Progress Notes (Signed)
Damon Conrad - 54 y.o. male MRN [id number]  Date of birth: September 08, 1966  Subjective Chief Complaint  Patient presents with  . Shortness of Breath    HPI Damon Conrad is a 54 y.o. male here today for follow up of COVID-19.  He continues to have feelings of shortness of breath and cough.  He is still on O2 but we have been able to wean to 2L/min.  He has not really done any trials off of O2.  He is using tessalon perles for cough, these are effective for about 2 hours and then cough returns.  He is eating and drinking normally.    ROS:  A comprehensive ROS was completed and negative except as noted per HPI  Allergies  Allergen Reactions  . Meloxicam     rash  . Lisinopril Other (See Comments)    Patient reports "makes him feel off".     Past Medical History:  Diagnosis Date  . GERD (gastroesophageal reflux disease)   . Gout   . HTN (hypertension), benign   . Hyperlipidemia     Past Surgical History:  Procedure Laterality Date  . ABDOMINAL HERNIA REPAIR Bilateral    x 2  . HERNIA REPAIR      Social History   Socioeconomic History  . Marital status: Single    Spouse name: Not on file  . Number of children: Not on file  . Years of education: Not on file  . Highest education level: Not on file  Occupational History  . Not on file  Tobacco Use  . Smoking status: Never Smoker  . Smokeless tobacco: Never Used  Substance and Sexual Activity  . Alcohol use: Yes    Comment: Rarely  . Drug use: No  . Sexual activity: Yes    Partners: Female  Other Topics Concern  . Not on file  Social History Narrative  . Not on file   Social Determinants of Health   Financial Resource Strain:   . Difficulty of Paying Living Expenses: Not on file  Food Insecurity:   . Worried About Programme researcher, broadcasting/film/video in the Last Year: Not on file  . Ran Out of Food in the Last Year: Not on file  Transportation Needs:   . Lack of Transportation (Medical): Not on file  . Lack of Transportation  (Non-Medical): Not on file  Physical Activity:   . Days of Exercise per Week: Not on file  . Minutes of Exercise per Session: Not on file  Stress:   . Feeling of Stress : Not on file  Social Connections:   . Frequency of Communication with Friends and Family: Not on file  . Frequency of Social Gatherings with Friends and Family: Not on file  . Attends Religious Services: Not on file  . Active Member of Clubs or Organizations: Not on file  . Attends Banker Meetings: Not on file  . Marital Status: Not on file    Family History  Problem Relation Age of Onset  . Alcohol abuse Father   . Cancer Mother   . Hypertension Mother     Health Maintenance  Topic Date Due  . COVID-19 Vaccine (1) Never done  . Fecal DNA (Cologuard)  Never done  . TETANUS/TDAP  09/20/2026 (Originally 06/21/1983)  . Hepatitis C Screening  Completed  . HIV Screening  Completed  . INFLUENZA VACCINE  Discontinued     ----------------------------------------------------------------------------------------------------------------------------------------------------------------------------------------------------------------- Physical Exam BP 137/89 (BP Location: Right Arm, Patient Position: Sitting, Cuff  Size: Large)   Pulse 90   Ht 5\' 7"  (1.702 m)   Wt 193 lb 1.9 oz (87.6 kg)   SpO2 98%   BMI 30.25 kg/m   Physical Exam Constitutional:      Appearance: He is well-developed.  HENT:     Head: Normocephalic and atraumatic.  Eyes:     General: No scleral icterus. Cardiovascular:     Rate and Rhythm: Normal rate and regular rhythm.  Pulmonary:     Effort: Pulmonary effort is normal.     Breath sounds: Normal breath sounds.  Neurological:     General: No focal deficit present.     Mental Status: He is alert.  Psychiatric:        Mood and Affect: Mood normal.        Behavior: Behavior normal.      ------------------------------------------------------------------------------------------------------------------------------------------------------------------------------------------------------------------- Assessment and Plan  COVID-19 virus infection Continued shortness of breath and cough.  Recommend delsym in addition to tessalon.  We did a trial off O2 today with him walking around clinic with desaturations to 88%.  He will continue at 2 L/min for now.  F F/u in 2 weeks.    No orders of the defined types were placed in this encounter.   Return in about 2 weeks (around 07/24/2020) for COVID f/u. 07/26/2020    This visit occurred during the SARS-CoV-2 public health emergency.  Safety protocols were in place, including screening questions prior to the visit, additional usage of staff PPE, and extensive cleaning of exam room while observing appropriate co ntact time as indicated for disinfecting solutions.

## 2020-07-10 NOTE — Patient Instructions (Signed)
Continue home oxygen at 2 liters.  Try adding delsym for cough.  You can take this twice per day as needed.   See me again in 2 weeks.

## 2020-07-12 ENCOUNTER — Other Ambulatory Visit: Payer: Self-pay

## 2020-07-12 ENCOUNTER — Ambulatory Visit: Payer: Commercial Managed Care - PPO | Admitting: Family Medicine

## 2020-07-12 ENCOUNTER — Ambulatory Visit (INDEPENDENT_AMBULATORY_CARE_PROVIDER_SITE_OTHER): Payer: Commercial Managed Care - PPO | Admitting: Family Medicine

## 2020-07-12 VITALS — BP 140/92 | HR 66

## 2020-07-12 DIAGNOSIS — R079 Chest pain, unspecified: Secondary | ICD-10-CM

## 2020-07-12 DIAGNOSIS — I10 Essential (primary) hypertension: Secondary | ICD-10-CM | POA: Diagnosis not present

## 2020-07-14 ENCOUNTER — Encounter: Payer: Self-pay | Admitting: Family Medicine

## 2020-07-14 DIAGNOSIS — R079 Chest pain, unspecified: Secondary | ICD-10-CM | POA: Insufficient documentation

## 2020-07-14 NOTE — Progress Notes (Signed)
Damon Conrad - 54 y.o. male MRN [id number]  Date of birth: 01/03/1966  Subjective No chief complaint on file.   HPI Jahzier Villalon is a 54 y.o. male here today after having episode of chest pain.  He is continuing to recover from COVID-19.  Reports having episode of sharp chest pain last night.  Pain lasted a couple of seconds and then resolved.  Pain did not radiate. He denies any recurrence since last night. He does not feel more short of breath or fatigued.    ROS:  A comprehensive ROS was completed and negative except as noted per HPI  Allergies  Allergen Reactions  . Meloxicam     rash  . Lisinopril Other (See Comments)    Patient reports "makes him feel off".     Past Medical History:  Diagnosis Date  . GERD (gastroesophageal reflux disease)   . Gout   . HTN (hypertension), benign   . Hyperlipidemia     Past Surgical History:  Procedure Laterality Date  . ABDOMINAL HERNIA REPAIR Bilateral    x 2  . HERNIA REPAIR      Social History   Socioeconomic History  . Marital status: Single    Spouse name: Not on file  . Number of children: Not on file  . Years of education: Not on file  . Highest education level: Not on file  Occupational History  . Not on file  Tobacco Use  . Smoking status: Never Smoker  . Smokeless tobacco: Never Used  Substance and Sexual Activity  . Alcohol use: Yes    Comment: Rarely  . Drug use: No  . Sexual activity: Yes    Partners: Female  Other Topics Concern  . Not on file  Social History Narrative  . Not on file   Social Determinants of Health   Financial Resource Strain:   . Difficulty of Paying Living Expenses: Not on file  Food Insecurity:   . Worried About Programme researcher, broadcasting/film/video in the Last Year: Not on file  . Ran Out of Food in the Last Year: Not on file  Transportation Needs:   . Lack of Transportation (Medical): Not on file  . Lack of Transportation (Non-Medical): Not on file  Physical Activity:   . Days of Exercise  per Week: Not on file  . Minutes of Exercise per Session: Not on file  Stress:   . Feeling of Stress : Not on file  Social Connections:   . Frequency of Communication with Friends and Family: Not on file  . Frequency of Social Gatherings with Friends and Family: Not on file  . Attends Religious Services: Not on file  . Active Member of Clubs or Organizations: Not on file  . Attends Banker Meetings: Not on file  . Marital Status: Not on file    Family History  Problem Relation Age of Onset  . Alcohol abuse Father   . Cancer Mother   . Hypertension Mother     Health Maintenance  Topic Date Due  . COVID-19 Vaccine (1) Never done  . Fecal DNA (Cologuard)  Never done  . TETANUS/TDAP  09/20/2026 (Originally 06/21/1983)  . Hepatitis C Screening  Completed  . HIV Screening  Completed  . INFLUENZA VACCINE  Discontinued     ----------------------------------------------------------------------------------------------------------------------------------------------------------------------------------------------------------------- Physical Exam BP (!) 140/92 (BP Location: Right Arm, Patient Position: Sitting)   Pulse 66   SpO2 98% Comment: pt using oxygen at time of assesment  Physical Exam Constitutional:  Appearance: Normal appearance.  HENT:     Head: Normocephalic and atraumatic.  Eyes:     General: No scleral icterus. Cardiovascular:     Rate and Rhythm: Normal rate and regular rhythm.  Pulmonary:     Effort: Pulmonary effort is normal.     Breath sounds: Normal breath sounds.  Musculoskeletal:     Cervical back: Neck supple.  Neurological:     Mental Status: He is alert.  Psychiatric:        Mood and Affect: Mood normal.        Behavior: Behavior normal.      ------------------------------------------------------------------------------------------------------------------------------------------------------------------------------------------------------------------- Assessment and Plan  Chest pain Chest pain is atypical of cardiac chest pain.  EKG completed showing NSR without signs of ischemia, normal QTc.  No increased O2 requirements, leg edema/pan or tachycardia to suggest PE.  Discussed red flagg signs and when/what he should call 911 for.  He will keep routine f/u in a couple of weeks.    No orders of the defined types were placed in this encounter.   No follow-ups on file.    This visit occurred during the SARS-CoV-2 public health emergency.  Safety protocols were in place, including screening questions prior to the visit, additional usage of staff PPE, and extensive cleaning of exam room while observing appropriate contact time as indicated for disinfecting solutions.

## 2020-07-14 NOTE — Assessment & Plan Note (Signed)
Chest pain is atypical of cardiac chest pain.  EKG completed showing NSR without signs of ischemia, normal QTc.  No increased O2 requirements, leg edema/pan or tachycardia to suggest PE.  Discussed red flagg signs and when/what he should call 911 for.  He will keep routine f/u in a couple of weeks.

## 2020-07-24 ENCOUNTER — Other Ambulatory Visit: Payer: Self-pay

## 2020-07-24 ENCOUNTER — Encounter: Payer: Self-pay | Admitting: Family Medicine

## 2020-07-24 ENCOUNTER — Ambulatory Visit (INDEPENDENT_AMBULATORY_CARE_PROVIDER_SITE_OTHER): Payer: Commercial Managed Care - PPO | Admitting: Family Medicine

## 2020-07-24 DIAGNOSIS — U071 COVID-19: Secondary | ICD-10-CM

## 2020-07-24 NOTE — Assessment & Plan Note (Signed)
He remains on O2 at 2L.  6 minute walk test with desaturation to 90%, dyspnea and tachycardia without O2.  He will remain on home O2 at 1-2 LPM..   Follow up in 2 weeks, will repeat CXR at that time.

## 2020-07-24 NOTE — Patient Instructions (Signed)
Let's continue the oxygen for now.  See me again in two weeks

## 2020-07-24 NOTE — Progress Notes (Signed)
Damon Conrad - 54 y.o. male MRN [id number]  Date of birth: 1966/01/24  Subjective No chief complaint on file.   HPI Damon Conrad is a 55 y.o. male here today for follow up of COVID-19.  He continues to have cough and feeling of shortness of breath.  He remains on 2L O2.  Sats 99% on 2L today at rest. He still gets pretty dyspneic with activity.    ROS:  A comprehensive ROS was completed and negative except as noted per HPI  Allergies  Allergen Reactions  . Meloxicam     rash  . Lisinopril Other (See Comments)    Patient reports "makes him feel off".     Past Medical History:  Diagnosis Date  . GERD (gastroesophageal reflux disease)   . Gout   . HTN (hypertension), benign   . Hyperlipidemia     Past Surgical History:  Procedure Laterality Date  . ABDOMINAL HERNIA REPAIR Bilateral    x 2  . HERNIA REPAIR      Social History   Socioeconomic History  . Marital status: Single    Spouse name: Not on file  . Number of children: Not on file  . Years of education: Not on file  . Highest education level: Not on file  Occupational History  . Not on file  Tobacco Use  . Smoking status: Never Smoker  . Smokeless tobacco: Never Used  Substance and Sexual Activity  . Alcohol use: Yes    Comment: Rarely  . Drug use: No  . Sexual activity: Yes    Partners: Female  Other Topics Concern  . Not on file  Social History Narrative  . Not on file   Social Determinants of Health   Financial Resource Strain:   . Difficulty of Paying Living Expenses: Not on file  Food Insecurity:   . Worried About Programme researcher, broadcasting/film/video in the Last Year: Not on file  . Ran Out of Food in the Last Year: Not on file  Transportation Needs:   . Lack of Transportation (Medical): Not on file  . Lack of Transportation (Non-Medical): Not on file  Physical Activity:   . Days of Exercise per Week: Not on file  . Minutes of Exercise per Session: Not on file  Stress:   . Feeling of Stress : Not on file   Social Connections:   . Frequency of Communication with Friends and Family: Not on file  . Frequency of Social Gatherings with Friends and Family: Not on file  . Attends Religious Services: Not on file  . Active Member of Clubs or Organizations: Not on file  . Attends Banker Meetings: Not on file  . Marital Status: Not on file    Family History  Problem Relation Age of Onset  . Alcohol abuse Father   . Cancer Mother   . Hypertension Mother     Health Maintenance  Topic Date Due  . COVID-19 Vaccine (1) Never done  . Fecal DNA (Cologuard)  Never done  . TETANUS/TDAP  09/20/2026 (Originally 06/21/1983)  . Hepatitis C Screening  Completed  . HIV Screening  Completed  . INFLUENZA VACCINE  Discontinued     ----------------------------------------------------------------------------------------------------------------------------------------------------------------------------------------------------------------- Physical Exam BP (!) 130/91 (BP Location: Right Arm, Cuff Size: Large)   Pulse 89   Wt 198 lb 1.9 oz (89.9 kg)   SpO2 99%   BMI 31.03 kg/m   Physical Exam Constitutional:      Appearance: Normal appearance.  Eyes:  General: No scleral icterus. Cardiovascular:     Rate and Rhythm: Normal rate and regular rhythm.  Pulmonary:     Effort: Pulmonary effort is normal.     Breath sounds: Normal breath sounds. No wheezing.  Neurological:     General: No focal deficit present.     Mental Status: He is alert.  Psychiatric:        Mood and Affect: Mood normal.        Behavior: Behavior normal.     ------------------------------------------------------------------------------------------------------------------------------------------------------------------------------------------------------------------- Assessment and Plan  COVID-19 virus infection He remains on O2 at 2L.  6 minute walk test with desaturation to 90%, dyspnea and tachycardia  without O2.  He will remain on home O2 at 1-2 LPM..   Follow up in 2 weeks, will repeat CXR at that time.     No orders of the defined types were placed in this encounter.   Return in about 2 weeks (around 08/07/2020) for COVID.    This visit occurred during the SARS-CoV-2 public health emergency.  Safety protocols were in place, including screening questions prior to the visit, additional usage of staff PPE, and extensive cleaning of exam room while observing appropriate contact time as indicated for disinfecting solutions.

## 2020-08-07 ENCOUNTER — Encounter: Payer: Self-pay | Admitting: Family Medicine

## 2020-08-07 ENCOUNTER — Ambulatory Visit (INDEPENDENT_AMBULATORY_CARE_PROVIDER_SITE_OTHER): Payer: Commercial Managed Care - PPO

## 2020-08-07 ENCOUNTER — Ambulatory Visit (INDEPENDENT_AMBULATORY_CARE_PROVIDER_SITE_OTHER): Payer: Commercial Managed Care - PPO | Admitting: Family Medicine

## 2020-08-07 ENCOUNTER — Other Ambulatory Visit: Payer: Self-pay

## 2020-08-07 VITALS — BP 148/99 | HR 77

## 2020-08-07 DIAGNOSIS — U071 COVID-19: Secondary | ICD-10-CM

## 2020-08-07 DIAGNOSIS — J1282 Pneumonia due to coronavirus disease 2019: Secondary | ICD-10-CM | POA: Diagnosis not present

## 2020-08-07 MED ORDER — MUPIROCIN 2 % EX OINT: 1.0000 "application " | TOPICAL_OINTMENT | Freq: Two times a day (BID) | CUTANEOUS | 0 refills | Status: AC

## 2020-08-07 MED ORDER — HYDROCODONE-HOMATROPINE 5-1.5 MG/5ML PO SYRP: 5.0000 mL | ORAL_SOLUTION | Freq: Three times a day (TID) | ORAL | 0 refills | Status: DC | PRN

## 2020-08-07 NOTE — Progress Notes (Signed)
Walked patient x 6 mins without O2 assistance.  Starting o2 sat: 96%  Min 1: 94%  Min 2: 94%  Min 3: 94%  Min 4: 94%  Min 5: 94% Ending O2 sat (sitting): 95%

## 2020-08-07 NOTE — Patient Instructions (Signed)
I think you can discontinue oxygen at this time.  You may use as needed.  Have chest xray today.  Follow up in 2 weeks to see if we can clear you to return to work.

## 2020-08-07 NOTE — Assessment & Plan Note (Addendum)
He is recovering well.  No significant desturations on 6 minute walk test today.  He will try going without O2 and use only when needed.  Repeat CXR and f/u in 2 weeks.  May be able to return to work at that time.  Add hycodan as needed for continued cough.  Mupirocin for nasal irritation from O2 cannula

## 2020-08-07 NOTE — Progress Notes (Signed)
Damon Conrad - 54 y.o. male MRN [id number]  Date of birth: Jun 01, 1966  Subjective Chief Complaint  Patient presents with  . Shortness of Breath    HPI Damon Conrad is a 54 y.o. male here today for follow up of COVID infection.  He has been on supplemental oxygen for several weeks since he was discharged from the hospital.  He continues to have cough but feels less dyspneic.  He has been using 1.5 L/min of O2.  He denies chest pain or fatigue.    ROS:  A comprehensive ROS was completed and negative except as noted per HPI  Allergies  Allergen Reactions  . Meloxicam     rash  . Lisinopril Other (See Comments)    Patient reports "makes him feel off".     Past Medical History:  Diagnosis Date  . GERD (gastroesophageal reflux disease)   . Gout   . HTN (hypertension), benign   . Hyperlipidemia     Past Surgical History:  Procedure Laterality Date  . ABDOMINAL HERNIA REPAIR Bilateral    x 2  . HERNIA REPAIR      Social History   Socioeconomic History  . Marital status: Single    Spouse name: Not on file  . Number of children: Not on file  . Years of education: Not on file  . Highest education level: Not on file  Occupational History  . Not on file  Tobacco Use  . Smoking status: Never Smoker  . Smokeless tobacco: Never Used  Substance and Sexual Activity  . Alcohol use: Yes    Comment: Rarely  . Drug use: No  . Sexual activity: Yes    Partners: Female  Other Topics Concern  . Not on file  Social History Narrative  . Not on file   Social Determinants of Health   Financial Resource Strain:   . Difficulty of Paying Living Expenses: Not on file  Food Insecurity:   . Worried About Programme researcher, broadcasting/film/video in the Last Year: Not on file  . Ran Out of Food in the Last Year: Not on file  Transportation Needs:   . Lack of Transportation (Medical): Not on file  . Lack of Transportation (Non-Medical): Not on file  Physical Activity:   . Days of Exercise per Week: Not on  file  . Minutes of Exercise per Session: Not on file  Stress:   . Feeling of Stress : Not on file  Social Connections:   . Frequency of Communication with Friends and Family: Not on file  . Frequency of Social Gatherings with Friends and Family: Not on file  . Attends Religious Services: Not on file  . Active Member of Clubs or Organizations: Not on file  . Attends Banker Meetings: Not on file  . Marital Status: Not on file    Family History  Problem Relation Age of Onset  . Alcohol abuse Father   . Cancer Mother   . Hypertension Mother     Health Maintenance  Topic Date Due  . COVID-19 Vaccine (1) Never done  . Fecal DNA (Cologuard)  Never done  . TETANUS/TDAP  09/20/2026 (Originally 06/21/1983)  . Hepatitis C Screening  Completed  . HIV Screening  Completed  . INFLUENZA VACCINE  Discontinued     ----------------------------------------------------------------------------------------------------------------------------------------------------------------------------------------------------------------- Physical Exam BP (!) 148/99 (BP Location: Left Arm, Patient Position: Sitting, Cuff Size: Large)   Pulse 77   SpO2 99% Comment: On 1.5 ltrs  Physical Exam Constitutional:  Appearance: Normal appearance. He is well-developed.  HENT:     Head: Normocephalic and atraumatic.  Eyes:     General: No scleral icterus. Cardiovascular:     Rate and Rhythm: Normal rate and regular rhythm.  Pulmonary:     Effort: Pulmonary effort is normal.     Breath sounds: Normal breath sounds.  Neurological:     General: No focal deficit present.     Mental Status: He is alert.  Psychiatric:        Mood and Affect: Mood normal.        Behavior: Behavior normal.     Walked patient x 6 mins without O2 assistance.  Starting o2 sat: 96%             Min 1: 94%             Min 2: 94%             Min 3: 94%             Min 4: 94%             Min 5: 94% Ending O2 sat  (sitting): 95%.  ------------------------------------------------------------------------------------------------------------------------------------------------------------------------------------------------------------------- Assessment and Plan  COVID-19 virus infection He is recovering well.  No significant desturations on 6 minute walk test today.  He will try going without O2 and use only when needed.  Repeat CXR and f/u in 2 weeks.  May be able to return to work at that time.  Add hycodan as needed for continued cough.  Mupirocin for nasal irritation from O2 cannula   Meds ordered this encounter  Medications  . HYDROcodone-homatropine (HYCODAN) 5-1.5 MG/5ML syrup    Sig: Take 5 mLs by mouth every 8 (eight) hours as needed for cough.    Dispense:  120 mL    Refill:  0  . mupirocin ointment (BACTROBAN) 2 %    Sig: Apply 1 application topically 2 (two) times daily for 7 days. Apply to inside of nares.    Dispense:  22 g    Refill:  0    Return in about 2 weeks (around 08/21/2020) for COVID f/u.    This visit occurred during the SARS-CoV-2 public health emergency.  Safety protocols were in place, including screening questions prior to the visit, additional usage of staff PPE, and extensive cleaning of exam room while observing appropriate contact time as indicated for disinfecting solutions.

## 2020-08-19 ENCOUNTER — Telehealth: Payer: Self-pay

## 2020-08-19 NOTE — Telephone Encounter (Signed)
Pt lvm stating slight chest pressure. He feels it may be attributed to anxiety. Wants to schedule an appointment ASAP.  Returned pt's call. LVM stating is chest pressure is increasing and he feels it is urgent, Mr. Heady is go to Urgent Care or the Emergency Room for evaluation.   If he feels anxiety is the cause of the pressure Mr. Mode is to call [phone] and ask for the Triage Nurse for immediate scheduling with an available provider.

## 2020-08-21 ENCOUNTER — Encounter: Payer: Self-pay | Admitting: Family Medicine

## 2020-08-21 ENCOUNTER — Ambulatory Visit (INDEPENDENT_AMBULATORY_CARE_PROVIDER_SITE_OTHER): Payer: Commercial Managed Care - PPO | Admitting: Family Medicine

## 2020-08-21 ENCOUNTER — Other Ambulatory Visit: Payer: Self-pay

## 2020-08-21 VITALS — BP 147/99 | HR 88 | Wt 199.7 lb

## 2020-08-21 DIAGNOSIS — R0683 Snoring: Secondary | ICD-10-CM | POA: Diagnosis not present

## 2020-08-21 DIAGNOSIS — R5383 Other fatigue: Secondary | ICD-10-CM | POA: Diagnosis not present

## 2020-08-21 DIAGNOSIS — F410 Panic disorder [episodic paroxysmal anxiety] without agoraphobia: Secondary | ICD-10-CM | POA: Diagnosis not present

## 2020-08-21 MED ORDER — ALPRAZOLAM 0.25 MG PO TABS: 0.2500 mg | ORAL_TABLET | Freq: Every evening | ORAL | 0 refills | Status: DC | PRN

## 2020-08-21 NOTE — Patient Instructions (Signed)
Panic Attack  A panic attack is when you suddenly feel very afraid, uncomfortable, or nervous (anxious). A panic attack can happen when you are scared or for no reason. A panic attack can feel like a serious problem. It can even feel like a heart attack or stroke. See your doctor when you have a panic attack to make sure you do not have a serious problem. Follow these instructions at home:  Take medicines only as told by your doctor.  If you feel worried or nervous, try not to have caffeine.  Take good care of your health. To do this: ? Eat healthy. Make sure to eat fresh fruits and vegetables, whole grains, lean meats, and low-fat dairy. ? Get enough sleep. Try to sleep for 7-8 hours each night. ? Exercise. Try to be active for 30 minutes 5 or more days a week. ? Do not smoke. Talk to your doctor if you need help quitting. ? Limit how much alcohol you drink:  If you are a woman who is not pregnant: try not to have more than 1 drink a day.  If you are a man: try not to have more than 2 drinks a day.  One drink equals 12 oz of beer, 5 oz of wine, or 1 oz of hard liquor.  Keep all follow-up visits as told by your doctor. This is important. Contact a doctor if:  Your symptoms do not get better.  Your symptoms get worse.  You are not able to take your medicines as told. Get help right away if:  You have thoughts of hurting yourself or others.  You have symptoms of a panic attack. Do not drive yourself to the hospital. Have someone else drive you or call an ambulance. If you feel like you may hurt yourself or others, or have thoughts about taking your own life, get help right away. You can go to your nearest emergency department or call:  Your local emergency services (911 in the [place]  A suicide crisis helpline, such as the National Suicide Prevention Lifeline at 1-800-273-8255. This is open 24 hours a day. Summary  A panic attack is when you suddenly feel very afraid,  uncomfortable, or nervous (anxious).  See your doctor when you have a panic attack to make sure that you do not have another serious problem.  If you feel like you may hurt yourself or others, get help right away by calling 911. This information is not intended to replace advice given to you by your health care provider. Make sure you discuss any questions you have with your health care provider. Document Revised: 10/01/2017 Document Reviewed: 12/02/2016 Elsevier Patient Education  2020 [employer]  

## 2020-08-22 ENCOUNTER — Telehealth: Payer: Self-pay

## 2020-08-22 ENCOUNTER — Other Ambulatory Visit: Payer: Self-pay | Admitting: Family Medicine

## 2020-08-22 MED ORDER — CLONAZEPAM 0.5 MG PO TABS: 0.5000 mg | ORAL_TABLET | Freq: Every evening | ORAL | 1 refills | Status: DC | PRN

## 2020-08-22 NOTE — Telephone Encounter (Signed)
Damon Conrad states Xanax caused his throat to swell. He took Benadryl and it helped.  He'd like another medication for his anxiety.

## 2020-08-22 NOTE — Telephone Encounter (Signed)
Clonazepam sent in to replace alprazolam.

## 2020-08-23 NOTE — Telephone Encounter (Signed)
Mr. Damon Conrad has been made aware of the medication change. He expressed understanding.

## 2020-08-23 NOTE — Telephone Encounter (Signed)
Patient was scheduled for an office visit and was seen by Dr. Ashley Royalty.

## 2020-08-25 DIAGNOSIS — F410 Panic disorder [episodic paroxysmal anxiety] without agoraphobia: Secondary | ICD-10-CM | POA: Insufficient documentation

## 2020-08-25 NOTE — Assessment & Plan Note (Signed)
O2 sats have remained normal His symptoms are consistent with panic attack.  Will add short term alprazolam 0.25mg  as needed.

## 2020-08-25 NOTE — Progress Notes (Signed)
Damon Conrad - 54 y.o. male MRN [id number]  Date of birth: 14-Jul-1966  Subjective Chief Complaint  Patient presents with  . Anxiety    HPI Damon Conrad is a 54 y.o. male here today to discuss anxiety.  He had COVID back in August with associated pneumonia.  He was on O2 until recently and has been able to wean from this.  His O2 sats at home and in clinic have remained normal.  He reports that he has had episodes where he feels anxious and more short of breath.  This does wake him up at times and he feels like his heart is racing as well.  He has checked O2 during these episodes and this has been normal.    ROS:  A comprehensive ROS was completed and negative except as noted per HPI  Allergies  Allergen Reactions  . Meloxicam     rash  . Lisinopril Other (See Comments)    Patient reports "makes him feel off".     Past Medical History:  Diagnosis Date  . GERD (gastroesophageal reflux disease)   . Gout   . HTN (hypertension), benign   . Hyperlipidemia     Past Surgical History:  Procedure Laterality Date  . ABDOMINAL HERNIA REPAIR Bilateral    x 2  . HERNIA REPAIR      Social History   Socioeconomic History  . Marital status: Single    Spouse name: Not on file  . Number of children: Not on file  . Years of education: Not on file  . Highest education level: Not on file  Occupational History  . Not on file  Tobacco Use  . Smoking status: Never Smoker  . Smokeless tobacco: Never Used  Substance and Sexual Activity  . Alcohol use: Yes    Comment: Rarely  . Drug use: No  . Sexual activity: Yes    Partners: Female  Other Topics Concern  . Not on file  Social History Narrative  . Not on file   Social Determinants of Health   Financial Resource Strain:   . Difficulty of Paying Living Expenses: Not on file  Food Insecurity:   . Worried About Programme researcher, broadcasting/film/video in the Last Year: Not on file  . Ran Out of Food in the Last Year: Not on file  Transportation Needs:    . Lack of Transportation (Medical): Not on file  . Lack of Transportation (Non-Medical): Not on file  Physical Activity:   . Days of Exercise per Week: Not on file  . Minutes of Exercise per Session: Not on file  Stress:   . Feeling of Stress : Not on file  Social Connections:   . Frequency of Communication with Friends and Family: Not on file  . Frequency of Social Gatherings with Friends and Family: Not on file  . Attends Religious Services: Not on file  . Active Member of Clubs or Organizations: Not on file  . Attends Banker Meetings: Not on file  . Marital Status: Not on file    Family History  Problem Relation Age of Onset  . Alcohol abuse Father   . Cancer Mother   . Hypertension Mother     Health Maintenance  Topic Date Due  . COVID-19 Vaccine (1) Never done  . Fecal DNA (Cologuard)  Never done  . TETANUS/TDAP  09/20/2026 (Originally 06/21/1983)  . Hepatitis C Screening  Completed  . HIV Screening  Completed  . INFLUENZA VACCINE  Discontinued     -----------------------------------------------------------------------------------------------------------------------------------------------------------------------------------------------------------------  Physical Exam BP (!) 147/99 (BP Location: Left Arm, Patient Position: Sitting, Cuff Size: Normal)   Pulse 88   Wt 199 lb 11.2 oz (90.6 kg)   SpO2 97%   BMI 31.28 kg/m   Physical Exam Constitutional:      Appearance: Normal appearance.  Cardiovascular:     Rate and Rhythm: Normal rate and regular rhythm.  Pulmonary:     Effort: Pulmonary effort is normal.     Breath sounds: Normal breath sounds.  Neurological:     General: No focal deficit present.     Mental Status: He is alert.  Psychiatric:        Mood and Affect: Mood normal.        Behavior: Behavior normal.      ------------------------------------------------------------------------------------------------------------------------------------------------------------------------------------------------------------------- Assessment and Plan  Panic attack O2 sats have remained normal His symptoms are consistent with panic attack.  Will add short term alprazolam 0.25mg  as needed.      Meds ordered this encounter  Medications  . DISCONTD: ALPRAZolam (XANAX) 0.25 MG tablet    Sig: Take 1 tablet (0.25 mg total) by mouth at bedtime as needed for anxiety.    Dispense:  15 tablet    Refill:  0    Return in about 4 weeks (around 09/18/2020) for anxiety.    This visit occurred during the SARS-CoV-2 public health emergency.  Safety protocols were in place, including screening questions prior to the visit, additional usage of staff PPE, and extensive cleaning of exam room while observing appropriate contact time as indicated for disinfecting solutions.

## 2020-08-28 ENCOUNTER — Other Ambulatory Visit: Payer: Self-pay | Admitting: Family Medicine

## 2020-09-11 ENCOUNTER — Ambulatory Visit (INDEPENDENT_AMBULATORY_CARE_PROVIDER_SITE_OTHER): Payer: Commercial Managed Care - PPO | Admitting: Family Medicine

## 2020-09-11 ENCOUNTER — Other Ambulatory Visit: Payer: Self-pay

## 2020-09-11 ENCOUNTER — Encounter: Payer: Self-pay | Admitting: Family Medicine

## 2020-09-11 DIAGNOSIS — F410 Panic disorder [episodic paroxysmal anxiety] without agoraphobia: Secondary | ICD-10-CM | POA: Diagnosis not present

## 2020-09-11 NOTE — Patient Instructions (Signed)
Continue current medication.  See me again in 3 months.

## 2020-09-11 NOTE — Assessment & Plan Note (Signed)
These have improved. Using clonazepam rarely.  Will continue on as needed basis, he is aware this is not for long term use.

## 2020-09-11 NOTE — Progress Notes (Signed)
Damon Conrad - 54 y.o. male MRN [id number]  Date of birth: December 24, 1965  Subjective Chief Complaint  Patient presents with  . Anxiety    HPI Damon Conrad is a 54 y.o. male here today for follow up of anxiety.  He reports that he is now sleeping much better.  He has only needed to use clonazepam a couple of times.  He is back at work.  He gets a little winded with exertion at work but overall is doing well.   ROS:  A comprehensive ROS was completed and negative except as noted per HPI  Allergies  Allergen Reactions  . Meloxicam     rash  . Lisinopril Other (See Comments)    Patient reports "makes him feel off".     Past Medical History:  Diagnosis Date  . GERD (gastroesophageal reflux disease)   . Gout   . HTN (hypertension), benign   . Hyperlipidemia     Past Surgical History:  Procedure Laterality Date  . ABDOMINAL HERNIA REPAIR Bilateral    x 2  . HERNIA REPAIR      Social History   Socioeconomic History  . Marital status: Single    Spouse name: Not on file  . Number of children: Not on file  . Years of education: Not on file  . Highest education level: Not on file  Occupational History  . Not on file  Tobacco Use  . Smoking status: Never Smoker  . Smokeless tobacco: Never Used  Substance and Sexual Activity  . Alcohol use: Yes    Comment: Rarely  . Drug use: No  . Sexual activity: Yes    Partners: Female  Other Topics Concern  . Not on file  Social History Narrative  . Not on file   Social Determinants of Health   Financial Resource Strain:   . Difficulty of Paying Living Expenses: Not on file  Food Insecurity:   . Worried About Programme researcher, broadcasting/film/video in the Last Year: Not on file  . Ran Out of Food in the Last Year: Not on file  Transportation Needs:   . Lack of Transportation (Medical): Not on file  . Lack of Transportation (Non-Medical): Not on file  Physical Activity:   . Days of Exercise per Week: Not on file  . Minutes of Exercise per  Session: Not on file  Stress:   . Feeling of Stress : Not on file  Social Connections:   . Frequency of Communication with Friends and Family: Not on file  . Frequency of Social Gatherings with Friends and Family: Not on file  . Attends Religious Services: Not on file  . Active Member of Clubs or Organizations: Not on file  . Attends Banker Meetings: Not on file  . Marital Status: Not on file    Family History  Problem Relation Age of Onset  . Alcohol abuse Father   . Cancer Mother   . Hypertension Mother     Health Maintenance  Topic Date Due  . Fecal DNA (Cologuard)  Never done  . TETANUS/TDAP  09/20/2026 (Originally 06/21/1983)  . COVID-19 Vaccine  Completed  . Hepatitis C Screening  Completed  . HIV Screening  Completed  . INFLUENZA VACCINE  Discontinued     ----------------------------------------------------------------------------------------------------------------------------------------------------------------------------------------------------------------- Physical Exam BP 133/84 (BP Location: Left Arm, Patient Position: Sitting, Cuff Size: Normal)   Pulse 87   Wt 199 lb 12.8 oz (90.6 kg)   SpO2 97%   BMI 31.29 kg/m  Physical Exam Constitutional:      Appearance: Normal appearance.  HENT:     Head: Normocephalic and atraumatic.  Cardiovascular:     Rate and Rhythm: Normal rate and regular rhythm.  Pulmonary:     Effort: Pulmonary effort is normal.     Breath sounds: Normal breath sounds.  Skin:    General: Skin is warm and dry.  Neurological:     General: No focal deficit present.     Mental Status: He is alert.  Psychiatric:        Mood and Affect: Mood normal.        Behavior: Behavior normal.     ------------------------------------------------------------------------------------------------------------------------------------------------------------------------------------------------------------------- Assessment and  Plan  Panic attack These have improved. Using clonazepam rarely.  Will continue on as needed basis, he is aware this is not for long term use.    No orders of the defined types were placed in this encounter.   Return in about 3 months (around 12/12/2020) for HTN/Anxiety.    This visit occurred during the SARS-CoV-2 public health emergency.  Safety protocols were in place, including screening questions prior to the visit, additional usage of staff PPE, and extensive cleaning of exam room while observing appropriate contact time as indicated for disinfecting solutions.

## 2020-10-06 ENCOUNTER — Other Ambulatory Visit: Payer: Self-pay | Admitting: Family Medicine

## 2020-10-06 DIAGNOSIS — K2100 Gastro-esophageal reflux disease with esophagitis, without bleeding: Secondary | ICD-10-CM

## 2020-10-15 ENCOUNTER — Telehealth (INDEPENDENT_AMBULATORY_CARE_PROVIDER_SITE_OTHER): Payer: Commercial Managed Care - PPO | Admitting: Family Medicine

## 2020-10-15 ENCOUNTER — Encounter: Payer: Self-pay | Admitting: Family Medicine

## 2020-10-15 DIAGNOSIS — J069 Acute upper respiratory infection, unspecified: Secondary | ICD-10-CM | POA: Insufficient documentation

## 2020-10-15 NOTE — Assessment & Plan Note (Signed)
Discussed with him that his symptoms are consistent with viral upper respiratory infection/cold. We discussed supportive care including increased fluid intake, rest. He may utilize over-the-counter cold and flu medications as needed to help with management of symptoms. Discussed with him that antibiotics were not indicated at this time and symptoms should resolve on their own. He is instructed to contact the clinic if symptoms continue to worsen or are not improving as expected.

## 2020-10-15 NOTE — Progress Notes (Signed)
Sx's started Saturday with sore throat.  Took cough medicine and cough drops. Helped with the throat. Started using inhaler on Sunday.   Nasal congestion.

## 2020-10-15 NOTE — Progress Notes (Signed)
Damon Conrad - 54 y.o. male MRN [id number]  Date of birth: 02-Jun-1966   This visit type was conducted due to national recommendations for restrictions regarding the COVID-19 Pandemic (e.g. social distancing).  This format is felt to be most appropriate for this patient at this time.  All issues noted in this document were discussed and addressed.  No physical exam was performed (except for noted visual exam findings with Video Visits).  I discussed the limitations of evaluation and management by telemedicine and the availability of in person appointments. The patient expressed understanding and agreed to proceed.  I connected with@ on 10/15/20 at  1:40 PM EST by a video enabled telemedicine application and verified that I am speaking with the correct person using two identifiers.  Interactive audio and video telecommunications were attempted between this provider and patient, however failed, due to patient having technical difficulties OR patient did not have access to video capability.  We continued and completed visit with audio only.    Present at visit: Everrett Coombe, DO Josephina Gip   Patient Location: Home [address]  [place] [address]   Provider location:   [facility]  No chief complaint on file.   HPI  Damon Conrad is a 54 y.o. male who presents via audio/video conferencing for a telehealth visit today.  He has complaint of congestion with cough as well as mild sore throat.  He does have some postnasal drainage.  He reports that his symptoms started a few days ago.  He denies fever, chills, shortness of breath, chest pain, increased fatigue, body aches or headaches.  He has not had any nausea or vomiting.  Currently using cough drops and albuterol inhaler.  He did have Covid in August.  He received [employer] vaccine and November.   ROS:  A comprehensive ROS was completed and negative except as noted per HPI  Past Medical History:  Diagnosis Date  . GERD  (gastroesophageal reflux disease)   . Gout   . HTN (hypertension), benign   . Hyperlipidemia     Past Surgical History:  Procedure Laterality Date  . ABDOMINAL HERNIA REPAIR Bilateral    x 2  . HERNIA REPAIR      Family History  Problem Relation Age of Onset  . Alcohol abuse Father   . Cancer Mother   . Hypertension Mother     Social History   Socioeconomic History  . Marital status: Single    Spouse name: Not on file  . Number of children: Not on file  . Years of education: Not on file  . Highest education level: Not on file  Occupational History  . Not on file  Tobacco Use  . Smoking status: Never Smoker  . Smokeless tobacco: Never Used  Substance and Sexual Activity  . Alcohol use: Yes    Comment: Rarely  . Drug use: No  . Sexual activity: Yes    Partners: Female  Other Topics Concern  . Not on file  Social History Narrative  . Not on file   Social Determinants of Health   Financial Resource Strain: Not on file  Food Insecurity: Not on file  Transportation Needs: Not on file  Physical Activity: Not on file  Stress: Not on file  Social Connections: Not on file  Intimate Partner Violence: Not on file     Current Outpatient Medications:  .  albuterol (VENTOLIN HFA) 108 (90 Base) MCG/ACT inhaler, Inhale into the lungs., Disp: , Rfl:  .  allopurinol (ZYLOPRIM) 300 MG tablet, Take 1 tablet (300 mg total) by mouth daily. To prevent gout, Disp: 90 tablet, Rfl: 1 .  atorvastatin (LIPITOR) 40 MG tablet, Take 1 tablet (40 mg total) by mouth daily., Disp: 90 tablet, Rfl: 2 .  baclofen (LIORESAL) 10 MG tablet, Take 1 tablet (10 mg total) by mouth at bedtime as needed for muscle spasms., Disp: 90 each, Rfl: 3 .  clonazePAM (KLONOPIN) 0.5 MG tablet, Take 1 tablet (0.5 mg total) by mouth at bedtime as needed for anxiety., Disp: 15 tablet, Rfl: 1 .  colchicine (COLCRYS) 0.6 MG tablet, Take 1 tablet (0.6 mg total) by mouth daily., Disp: 90 tablet, Rfl: 1 .  diclofenac  sodium (VOLTAREN) 1 % GEL, Apply 4 g topically 4 (four) times daily. To affected joint., Disp: 100 g, Rfl: 11 .  HYDROcodone-homatropine (HYCODAN) 5-1.5 MG/5ML syrup, Take 5 mLs by mouth every 8 (eight) hours as needed for cough., Disp: 120 mL, Rfl: 0 .  ipratropium (ATROVENT) 0.06 % nasal spray, Place 2 sprays into both nostrils 4 (four) times daily., Disp: 15 mL, Rfl: 1 .  metoprolol succinate (TOPROL-XL) 100 MG 24 hr tablet, Take 1 tablet (100 mg total) by mouth daily. Take with or immediately following a meal., Disp: 90 tablet, Rfl: 2 .  omeprazole (PRILOSEC) 40 MG capsule, TAKE 1 CAPSULE(40 MG) BY MOUTH DAILY, Disp: 90 capsule, Rfl: 2 .  predniSONE (DELTASONE) 50 MG tablet, TAKE 1 TABLET BY MOUTH ONCE DAILY AS NEEDED FOR GOUT FLAIR, Disp: 30 tablet, Rfl: 0 .  triamcinolone (NASACORT) 55 MCG/ACT AERO nasal inhaler, Place 2 sprays into the nose daily., Disp: 1 Inhaler, Rfl: 11  EXAM:  VITALS per patient if applicable: BP (!) 141/92   Temp (!) 96.9 F (36.1 C)   Wt 200 lb (90.7 kg)   SpO2 97%   BMI 31.32 kg/m   GENERAL: alert, oriented, in no acute distress  LUNGS:  no signs of respiratory distress, breathing rate appears normal, no obvious gross SOB, gasping or wheezing  all visible extremities without noticeable abnormality  PSYCH/NEURO: pleasant and cooperative, no obvious depression or anxiety, speech and thought processing grossly intact  ASSESSMENT AND PLAN:  Discussed the following assessment and plan:  Upper respiratory infection Discussed with him that his symptoms are consistent with viral upper respiratory infection/cold. We discussed supportive care including increased fluid intake, rest. He may utilize over-the-counter cold and flu medications as needed to help with management of symptoms. Discussed with him that antibiotics were not indicated at this time and symptoms should resolve on their own. He is instructed to contact the clinic if symptoms continue to worsen  or are not improving as expected.  20 minutes spent including pre visit preparation, review of prior notes and labs, encounter with patient via telephone visit and same day documentation.    I discussed the assessment and treatment plan with the patient. The patient was provided an opportunity to ask questions and all were answered. The patient agreed with the plan and demonstrated an understanding of the instructions.   The patient was advised to call back or seek an in-person evaluation if the symptoms worsen or if the condition fails to improve as anticipated.    Everrett Coombe, DO

## 2020-10-23 NOTE — Progress Notes (Deleted)
    Subjective:    CC: Back and hip pain  I, Thoms Barthelemy, LAT, ATC, am serving as scribe for Dr. Clementeen Graham.  HPI: Pt is a 54 y/o male presenting w/ c/o low back and hip pain .  He locates his pain to .  Of note, pt was a primary care pt of Dr. Zollie Pee at [facility].  Radiating pain: LE numbness/tingling: LE weakness: Aggravating factors: Treatments tried:  Diagnostic testing: L-spine XR- 02/25/15; R hip XR- 02/20/15  Pertinent review of Systems: ***  Relevant historical information: ***   Objective:   There were no vitals filed for this visit. General: Well Developed, well nourished, and in no acute distress.   MSK: ***  Lab and Radiology Results No results found for this or any previous visit (from the past 72 hour(s)). No results found.    Impression and Recommendations:    Assessment and Plan: 54 y.o. male with ***.  PDMP not reviewed this encounter. No orders of the defined types were placed in this encounter.  No orders of the defined types were placed in this encounter.   Discussed warning signs or symptoms. Please see discharge instructions. Patient expresses understanding.   ***

## 2020-10-24 ENCOUNTER — Ambulatory Visit: Payer: Commercial Managed Care - PPO | Admitting: Family Medicine

## 2020-10-29 ENCOUNTER — Encounter: Payer: Self-pay | Admitting: Family Medicine

## 2020-10-29 ENCOUNTER — Other Ambulatory Visit: Payer: Self-pay

## 2020-10-29 ENCOUNTER — Ambulatory Visit (INDEPENDENT_AMBULATORY_CARE_PROVIDER_SITE_OTHER): Payer: Commercial Managed Care - PPO

## 2020-10-29 ENCOUNTER — Ambulatory Visit: Payer: Self-pay

## 2020-10-29 ENCOUNTER — Ambulatory Visit (INDEPENDENT_AMBULATORY_CARE_PROVIDER_SITE_OTHER): Payer: Commercial Managed Care - PPO | Admitting: Family Medicine

## 2020-10-29 VITALS — BP 128/88 | HR 83 | Ht 67.0 in | Wt 203.4 lb

## 2020-10-29 DIAGNOSIS — G8929 Other chronic pain: Secondary | ICD-10-CM

## 2020-10-29 DIAGNOSIS — M5442 Lumbago with sciatica, left side: Secondary | ICD-10-CM

## 2020-10-29 MED ORDER — TIZANIDINE HCL 4 MG PO TABS: 4.0000 mg | ORAL_TABLET | Freq: Three times a day (TID) | ORAL | 1 refills | Status: DC | PRN

## 2020-10-29 MED ORDER — PREDNISONE 10 MG PO TABS: 30.0000 mg | ORAL_TABLET | Freq: Every day | ORAL | 0 refills | Status: DC

## 2020-10-29 NOTE — Patient Instructions (Addendum)
Thank you for coming in today.  Please get an Xray today before you leave  Try the tizanidine muscle relaxer.   If you have a really bad day take the prednisone.   You had a L SI joint injection today.  Call or go to the ER if you develop a large red swollen joint with extreme pain or oozing puss.

## 2020-10-29 NOTE — Progress Notes (Signed)
Subjective:    CC: Back and L hip pain  I, Damon Conrad, LAT, ATC, am serving as scribe for Dr. Clementeen Graham.  HPI: Pt is a 54 y/o male presenting w/ c/o chronic low back pain and L hip pain that has worsened x 3-4 months.  He locates his pain to his L lateral hip/buttocks that radiates into his L lateral thgh.  Radiating pain: yes into the L lateral thigh LE numbness/tingling: no LE weakness: no Aggravating factors: prolonged sitting or standing; transitioning from sit-to-stand Treatments tried: IBU; Aleve  Diagnostic imaging: L-spine XR- 02/25/15; R hip XR- 02/20/15  Pertinent review of Systems: No fevers or chills  Relevant historical information: Hypertension, history significant Covid infection earlier this year.   Objective:    Vitals:   10/29/20 1305  BP: 128/88  Pulse: 83  SpO2: 96%   General: Well Developed, well nourished, and in no acute distress.   MSK: L-spine normal-appearing Nontender midline.  Tender palpation left SI joint.  Decreased lumbar motion.  Strength is intact.  Lab and Radiology Results  Procedure: Real-time Ultrasound Guided Injection of left SI joint Device: Philips Affiniti 50G Images permanently stored and available for review in PACS Verbal informed consent obtained.  Discussed risks and benefits of procedure. Warned about infection bleeding damage to structures skin hypopigmentation and fat atrophy among others. Patient expresses understanding and agreement Time-out conducted.   Noted no overlying erythema, induration, or other signs of local infection.   Skin prepped in a sterile fashion.   Local anesthesia: Topical Ethyl chloride.   With sterile technique and under real time ultrasound guidance:  40 mg of Kenalog and 2 L of Marcaine injected into SI joint. Fluid seen entering the SI joint groove.   Completed without difficulty   Pain moderately resolved suggesting accurate placement of the medication.   Advised to call if  fevers/chills, erythema, induration, drainage, or persistent bleeding.   Images permanently stored and available for review in the ultrasound unit.  Impression: Technically successful ultrasound guided injection.   X-ray images L-spine obtained today personally and independently interpreted Mild diffuse DDD.  No acute fractures or malalignment. Await formal radiology review     Impression and Recommendations:    Assessment and Plan: 54 y.o. male with left low back pain ongoing for 3 to 4 months..  Multifactorial.  Patient does have point tenderness in the left SI joint and had moderate response to injection in clinic today.  Therefore a fair amount of the pain is probably from the SI joint itself.  He also has some pain from the lumbar paraspinal musculature and probably from facet generated back pain as well.  Plan for injection as above.  Also recommend short course of tizanidine.  Backup course of prednisone prescribed in case his symptoms return or do not resolve.  Discussed physical therapy.  Patient declined PT at this time although that would be a good option if not improving.  PDMP not reviewed this encounter. Orders Placed This Encounter  Procedures  . DG Lumbar Spine 2-3 Views    Standing Status:   Future    Number of Occurrences:   1    Standing Expiration Date:   11/29/2020    Order Specific Question:   Reason for Exam (SYMPTOM  OR DIAGNOSIS REQUIRED)    Answer:   Low back pain    Order Specific Question:   Preferred imaging location?    Answer:   [name]  . [place]  LIMITED JOINT SPACE STRUCTURES LOW LEFT(NO LINKED CHARGES)    Order Specific Question:   Reason for Exam (SYMPTOM  OR DIAGNOSIS REQUIRED)    Answer:   lt SI ninj    Order Specific Question:   Preferred imaging location?    Answer:   [facility] ordered this encounter  Medications  . tiZANidine (ZANAFLEX) 4 MG tablet    Sig: Take 1 tablet (4 mg total) by mouth every 8  (eight) hours as needed for muscle spasms.    Dispense:  90 tablet    Refill:  1  . predniSONE (DELTASONE) 10 MG tablet    Sig: Take 3 tablets (30 mg total) by mouth daily with breakfast.    Dispense:  15 tablet    Refill:  0    Discussed warning signs or symptoms. Please see discharge instructions. Patient expresses understanding.   The above documentation has been reviewed and is accurate and complete Clementeen Graham, M.D.

## 2020-10-30 NOTE — Progress Notes (Signed)
Back arthritis is present.

## 2020-11-04 ENCOUNTER — Telehealth: Payer: Self-pay | Admitting: Family Medicine

## 2020-11-04 NOTE — Telephone Encounter (Signed)
Patient called stating that he is still having a lot of back pain. He has been taking Ibuprofen but that only seems to help for about 2 hours. He asked if there was anything else he could take that might help?  Please advise.  Pharmacy: [facility]

## 2020-11-05 MED ORDER — NAPROXEN 500 MG PO TABS: 500.0000 mg | ORAL_TABLET | Freq: Two times a day (BID) | ORAL | 3 refills | Status: AC

## 2020-11-05 NOTE — Telephone Encounter (Signed)
I have prescribed naproxen.  This is a little different than ibuprofen.  It should last a longer.  You can take it twice daily.  Do not take with ibuprofen or Aleve over-the-counter.  Okay to take with Tylenol over-the-counter however.

## 2020-11-05 NOTE — Telephone Encounter (Signed)
Called and LM at pt's home number regarding new rx and fact that he is not to take it w/ IBU/Advil or Aleve.

## 2020-11-19 ENCOUNTER — Telehealth: Payer: Self-pay | Admitting: Family Medicine

## 2020-11-19 NOTE — Telephone Encounter (Signed)
Vimovo is just naproxen with an acid blocker and it.  Its not safe to take ibuprofen with naproxen.  You can try adding Tylenol to the naproxen.  That is safe and generally pretty helpful.

## 2020-11-19 NOTE — Telephone Encounter (Signed)
Pt thinks the naproxen is not working. His hip/back pain is better but he is having consistent calf pain. He remembers taking Vimovo successfully in the past, and wonders if he should try this. Also wonders if he an add ibuprofen along with the naproxen?

## 2020-11-19 NOTE — Telephone Encounter (Signed)
Called Damon Conrad and relayed Dr. Zollie Pee advice regarding taking Tylenol w/ Naproxen.  Damon Conrad states that he has been having new L calf pain.  Damon Conrad then asks if he can take Naproxen w/ Tizanidine which he was initially prescribed.  After checking to see if there was a negative reaction/response to taking Naproxen w/ Tizanidine which there was no neg response noted, I advise Damon Conrad to start by taking Tylenol w/ Naproxen for a few days and if not noticing any relief, then he may try Naproxen w/ Tizanidine.  Damon Conrad verbalizes understanding.  Also advise Damon Conrad that if he is getting no relief from his L calf pain w/ these different medication combinations, then he should schedule a f/u visit w/ Dr. Denyse Amass.

## 2020-12-12 ENCOUNTER — Encounter: Payer: Self-pay | Admitting: Family Medicine

## 2020-12-12 ENCOUNTER — Ambulatory Visit (INDEPENDENT_AMBULATORY_CARE_PROVIDER_SITE_OTHER): Payer: Commercial Managed Care - PPO

## 2020-12-12 ENCOUNTER — Other Ambulatory Visit: Payer: Self-pay

## 2020-12-12 ENCOUNTER — Ambulatory Visit (INDEPENDENT_AMBULATORY_CARE_PROVIDER_SITE_OTHER): Payer: Commercial Managed Care - PPO | Admitting: Family Medicine

## 2020-12-12 VITALS — BP 150/93 | HR 75 | Wt 205.0 lb

## 2020-12-12 DIAGNOSIS — M79662 Pain in left lower leg: Secondary | ICD-10-CM

## 2020-12-12 DIAGNOSIS — I10 Essential (primary) hypertension: Secondary | ICD-10-CM

## 2020-12-12 DIAGNOSIS — Z125 Encounter for screening for malignant neoplasm of prostate: Secondary | ICD-10-CM

## 2020-12-12 DIAGNOSIS — Z13220 Encounter for screening for lipoid disorders: Secondary | ICD-10-CM | POA: Diagnosis not present

## 2020-12-12 DIAGNOSIS — F410 Panic disorder [episodic paroxysmal anxiety] without agoraphobia: Secondary | ICD-10-CM

## 2020-12-12 DIAGNOSIS — E782 Mixed hyperlipidemia: Secondary | ICD-10-CM

## 2020-12-12 MED ORDER — AMLODIPINE BESYLATE 5 MG PO TABS: 5.0000 mg | ORAL_TABLET | Freq: Every day | ORAL | 3 refills | Status: DC

## 2020-12-12 NOTE — Progress Notes (Unsigned)
Damon Conrad - 55 y.o. male MRN [id number]  Date of birth: 04-Apr-1966  Subjective No chief complaint on file.   HPI Damon Conrad is a 55 year old male here today for follow-up of hypertension and anxiety.  He developed increased anxiety and difficulty sleeping after having COVID a few months ago.  His course was complicated by acute respiratory failure and he was on oxygen for several weeks.  He has now been off oxygen at this point for a few months.  Reports that his anxiety seems to be better at this time.  He has not needed clonazepam to help with sleep recently.  He does have complaint of left calf pain today.  He did see Dr. Denyse Amass for hip and leg pain last month.  Was prescribed naproxen 500 mg twice daily as needed.  He states that his calf pain continue to worsen despite this.  He has pain with walking as well as with dorsiflexion.  He has not noticed any significant swelling of the leg.  He does have some varicosities and spider veins.  His blood pressure is elevated today.  This is currently treated with metoprolol.  No side effects related to current medication.  He does not monitor blood pressure at home regularly.  He has not had chest pain, shortness of breath, palpitations, headache or vision changes.  He would also like to have updated labs today.  ROS:  A comprehensive ROS was completed and negative except as noted per HPI   Allergies  Allergen Reactions  . Meloxicam     rash  . Lisinopril Other (See Comments)    Patient reports "makes him feel off".     Past Medical History:  Diagnosis Date  . GERD (gastroesophageal reflux disease)   . Gout   . HTN (hypertension), benign   . Hyperlipidemia     Past Surgical History:  Procedure Laterality Date  . ABDOMINAL HERNIA REPAIR Bilateral    x 2  . HERNIA REPAIR      Social History   Socioeconomic History  . Marital status: Single    Spouse name: Not on file  . Number of children: Not on file  . Years of  education: Not on file  . Highest education level: Not on file  Occupational History  . Not on file  Tobacco Use  . Smoking status: Never Smoker  . Smokeless tobacco: Never Used  Substance and Sexual Activity  . Alcohol use: Yes    Comment: Rarely  . Drug use: No  . Sexual activity: Yes    Partners: Female  Other Topics Concern  . Not on file  Social History Narrative  . Not on file   Social Determinants of Health   Financial Resource Strain: Not on file  Food Insecurity: Not on file  Transportation Needs: Not on file  Physical Activity: Not on file  Stress: Not on file  Social Connections: Not on file    Family History  Problem Relation Age of Onset  . Alcohol abuse Father   . Cancer Mother   . Hypertension Mother     Health Maintenance  Topic Date Due  . Fecal DNA (Cologuard)  Never done  . COVID-19 Vaccine (2 - Booster for [employer] series) 12/28/2020 (Originally 11/02/2020)  . TETANUS/TDAP  09/20/2026 (Originally 06/21/1983)  . Hepatitis C Screening  Completed  . HIV Screening  Completed  . INFLUENZA VACCINE  Discontinued     ----------------------------------------------------------------------------------------------------------------------------------------------------------------------------------------------------------------- Physical Exam BP (!) 150/93   Pulse 75  Wt 205 lb (93 kg)   SpO2 95%   BMI 32.11 kg/m   Physical Exam Constitutional:      Appearance: Normal appearance.  Eyes:     General: No scleral icterus. Cardiovascular:     Rate and Rhythm: Normal rate and regular rhythm.  Pulmonary:     Effort: Pulmonary effort is normal.     Breath sounds: Normal breath sounds.  Musculoskeletal:     Cervical back: Neck supple.  Neurological:     General: No focal deficit present.     Mental Status: He is alert.  Psychiatric:        Mood and Affect: Mood normal.        Behavior: Behavior normal.      ------------------------------------------------------------------------------------------------------------------------------------------------------------------------------------------------------------------- Assessment and Plan  HLD (hyperlipidemia) Update lipid profile.  Pain of left calf Pain in left calf has worsened despite use of NSAID.  Will obtain stat ultrasound of left lower extremity to rule out DVT.  Panic attack These have improved, sleeping better at this time.  He may use clonazepam as needed.  Essential hypertension, benign Blood pressure is elevated today.  We will add amlodipine 5 mg daily.  He will continue with metoprolol at current dose.  Recommend low-sodium diet to help with blood pressure as well.   Meds ordered this encounter  Medications  . amLODipine (NORVASC) 5 MG tablet    Sig: Take 1 tablet (5 mg total) by mouth daily.    Dispense:  90 tablet    Refill:  3    No follow-ups on file.    This visit occurred during the SARS-CoV-2 public health emergency.  Safety protocols were in place, including screening questions prior to the visit, additional usage of staff PPE, and extensive cleaning of exam room while observing appropriate contact time as indicated for disinfecting solutions.

## 2020-12-12 NOTE — Patient Instructions (Signed)
Start amlodipine 5mg  daily.  Continue metoprolol Have labs completed.  Have ultrasound completed.  We'll be in touch with results.

## 2020-12-13 ENCOUNTER — Encounter: Payer: Self-pay | Admitting: Family Medicine

## 2020-12-13 DIAGNOSIS — M79662 Pain in left lower leg: Secondary | ICD-10-CM | POA: Insufficient documentation

## 2020-12-13 LAB — COMPLETE METABOLIC PANEL WITH GFR
AG Ratio: 1.5 (calc) (ref 1.0–2.5)
ALT: 20 U/L (ref 9–46)
AST: 18 U/L (ref 10–35)
Albumin: 4.1 g/dL (ref 3.6–5.1)
Alkaline phosphatase (APISO): 61 U/L (ref 35–144)
BUN: 16 mg/dL (ref 7–25)
CO2: 26 mmol/L (ref 20–32)
Calcium: 9.6 mg/dL (ref 8.6–10.3)
Chloride: 106 mmol/L (ref 98–110)
Creat: 1.11 mg/dL (ref 0.70–1.33)
GFR, Est African American: 86 mL/min/{1.73_m2} (ref >=60)
GFR, Est Non African American: 74 mL/min/{1.73_m2} (ref >=60)
Globulin: 2.8 g/dL (calc) (ref 1.9–3.7)
Glucose, Bld: 95 mg/dL (ref 65–99)
Potassium: 4.4 mmol/L (ref 3.5–5.3)
Sodium: 142 mmol/L (ref 135–146)
Total Bilirubin: 0.5 mg/dL (ref 0.2–1.2)
Total Protein: 6.9 g/dL (ref 6.1–8.1)

## 2020-12-13 LAB — CBC
HCT: 43.7 % (ref 38.5–50.0)
Hemoglobin: 14.9 g/dL (ref 13.2–17.1)
MCH: 29.6 pg (ref 27.0–33.0)
MCHC: 34.1 g/dL (ref 32.0–36.0)
MCV: 86.7 fL (ref 80.0–100.0)
MPV: 10.7 fL (ref 7.5–12.5)
Platelets: 194 10*3/uL (ref 140–400)
RBC: 5.04 10*6/uL (ref 4.20–5.80)
RDW: 14.9 % (ref 11.0–15.0)
WBC: 7.6 10*3/uL (ref 3.8–10.8)

## 2020-12-13 LAB — PSA: PSA: 0.44 ng/mL (ref <=4.0)

## 2020-12-13 LAB — LIPID PANEL
Cholesterol: 213 mg/dL — ABNORMAL HIGH (ref <200)
HDL: 41 mg/dL (ref >=40)
LDL Cholesterol (Calc): 141 mg/dL (calc) — ABNORMAL HIGH
Non-HDL Cholesterol (Calc): 172 mg/dL (calc) — ABNORMAL HIGH (ref <130)
Total CHOL/HDL Ratio: 5.2 (calc) — ABNORMAL HIGH (ref <5.0)
Triglycerides: 178 mg/dL — ABNORMAL HIGH (ref <150)

## 2020-12-13 LAB — TSH: TSH: 1.65 mIU/L (ref 0.40–4.50)

## 2020-12-13 NOTE — Assessment & Plan Note (Signed)
Pain in left calf has worsened despite use of NSAID.  Will obtain stat ultrasound of left lower extremity to rule out DVT.

## 2020-12-13 NOTE — Assessment & Plan Note (Signed)
Blood pressure is elevated today.  We will add amlodipine 5 mg daily.  He will continue with metoprolol at current dose.  Recommend low-sodium diet to help with blood pressure as well.

## 2020-12-13 NOTE — Assessment & Plan Note (Signed)
Update lipid profile

## 2020-12-13 NOTE — Assessment & Plan Note (Signed)
These have improved, sleeping better at this time.  He may use clonazepam as needed.

## 2020-12-14 ENCOUNTER — Other Ambulatory Visit: Payer: Self-pay | Admitting: Family Medicine

## 2020-12-14 DIAGNOSIS — I10 Essential (primary) hypertension: Secondary | ICD-10-CM

## 2021-03-14 ENCOUNTER — Telehealth: Payer: Self-pay

## 2021-03-14 NOTE — Telephone Encounter (Signed)
Pt lvm stating he was having cold symptoms. Requested that Dr. Ashley Royalty send a Z-pack or ABX to the pharmacy.   Returned the patient's call. No answer. LVM requesting patient callback to schedule appt with one of the NP's today or see Dr. Anastasio Auerbach next week. Dr. Anastasio Auerbach does not prescribe ABX's without a visit.   Advised patient to callback to Triage or scheduling.

## 2021-03-14 NOTE — Telephone Encounter (Signed)
Called pt twice and no answer, lvm to call to see an NP or Dr. Ashley Royalty' next week. tvt

## 2021-05-16 ENCOUNTER — Ambulatory Visit (INDEPENDENT_AMBULATORY_CARE_PROVIDER_SITE_OTHER): Payer: Commercial Managed Care - PPO

## 2021-05-16 ENCOUNTER — Ambulatory Visit: Payer: Commercial Managed Care - PPO | Admitting: Family Medicine

## 2021-05-16 ENCOUNTER — Encounter: Payer: Self-pay | Admitting: Family Medicine

## 2021-05-16 ENCOUNTER — Other Ambulatory Visit: Payer: Self-pay

## 2021-05-16 VITALS — BP 140/82 | HR 58 | Temp 97.8°F | Ht 67.0 in | Wt 215.0 lb

## 2021-05-16 DIAGNOSIS — B9689 Other specified bacterial agents as the cause of diseases classified elsewhere: Secondary | ICD-10-CM | POA: Diagnosis not present

## 2021-05-16 DIAGNOSIS — M549 Dorsalgia, unspecified: Secondary | ICD-10-CM

## 2021-05-16 DIAGNOSIS — J019 Acute sinusitis, unspecified: Secondary | ICD-10-CM

## 2021-05-16 DIAGNOSIS — R059 Cough, unspecified: Secondary | ICD-10-CM

## 2021-05-16 DIAGNOSIS — R079 Chest pain, unspecified: Secondary | ICD-10-CM

## 2021-05-16 MED ORDER — DOXYCYCLINE HYCLATE 100 MG PO TABS: 100.0000 mg | ORAL_TABLET | Freq: Two times a day (BID) | ORAL | 0 refills | Status: AC

## 2021-05-16 MED ORDER — BENZONATATE 200 MG PO CAPS
200.0000 mg | ORAL_CAPSULE | Freq: Two times a day (BID) | ORAL | 0 refills | Status: DC | PRN
Start: 2021-05-16 — End: 2021-06-05

## 2021-05-16 NOTE — Patient Instructions (Signed)
Start doxycycline 100mg  twice per day for the next week.  We'll be in touch with chest xray results.

## 2021-05-17 DIAGNOSIS — J019 Acute sinusitis, unspecified: Secondary | ICD-10-CM | POA: Insufficient documentation

## 2021-05-17 DIAGNOSIS — B9689 Other specified bacterial agents as the cause of diseases classified elsewhere: Secondary | ICD-10-CM | POA: Insufficient documentation

## 2021-05-17 NOTE — Assessment & Plan Note (Signed)
He has had prolonged symptoms which have not improved over the past 2 weeks.  He has a left maxillary sinus tenderness.  We will have him start doxycycline for a course of 7 days.  Also adding Tessalon Perles for cough.  Chest x-ray ordered as he is having some back pain associated with his cough

## 2021-05-17 NOTE — Progress Notes (Signed)
Damon Conrad - 55 y.o. male MRN [id number]  Date of birth: 1966/06/01  Subjective Chief Complaint  Patient presents with   Nasal Congestion    HPI Damon Conrad is a 55 year old male here today with complaint of sinus pain, congestion, cough and back pain.  He reports his symptoms started about 2 weeks ago.  Symptoms have not really improved since that time.  He has taken COVID test x2 which was negative.  Most recently was a day ago.  He denies shortness of breath, fever, chills, nausea, vomiting, diarrhea or fatigue.  He reports his cough is harsh but nonproductive.  He does cough to the point where he feels like he may throw up.  Of note he was previously on oxygen for prolonged period of time after having COVID last year.  ROS:  A comprehensive ROS was completed and negative except as noted per HPI  Allergies  Allergen Reactions   Meloxicam     rash   Lisinopril Other (See Comments)    Patient reports "makes him feel off".     Past Medical History:  Diagnosis Date   GERD (gastroesophageal reflux disease)    Gout    HTN (hypertension), benign    Hyperlipidemia     Past Surgical History:  Procedure Laterality Date   ABDOMINAL HERNIA REPAIR Bilateral    x 2   HERNIA REPAIR      Social History   Socioeconomic History   Marital status: Single    Spouse name: Not on file   Number of children: Not on file   Years of education: Not on file   Highest education level: Not on file  Occupational History   Not on file  Tobacco Use   Smoking status: Never   Smokeless tobacco: Never  Substance and Sexual Activity   Alcohol use: Yes    Comment: Rarely   Drug use: No   Sexual activity: Yes    Partners: Female  Other Topics Concern   Not on file  Social History Narrative   Not on file   Social Determinants of Health   Financial Resource Strain: Not on file  Food Insecurity: Not on file  Transportation Needs: Not on file  Physical Activity: Not on file  Stress: Not on file   Social Connections: Not on file    Family History  Problem Relation Age of Onset   Alcohol abuse Father    Cancer Mother    Hypertension Mother     Health Maintenance  Topic Date Due   Fecal DNA (Cologuard)  Never done   Zoster Vaccines- Shingrix (1 of 2) Never done   COVID-19 Vaccine (2 - Booster for [employer] series) 11/02/2020   TETANUS/TDAP  09/20/2026 (Originally 06/21/1983)   Hepatitis C Screening  Completed   HIV Screening  Completed   Pneumococcal Vaccine 82-85 Years old  Aged Out   HPV VACCINES  Aged Out   INFLUENZA VACCINE  Discontinued     ----------------------------------------------------------------------------------------------------------------------------------------------------------------------------------------------------------------- Physical Exam BP 140/82 (BP Location: Left Arm, Patient Position: Sitting, Cuff Size: Large)   Pulse (!) 58   Temp 97.8 F (36.6 C)   Ht 5\' 7"  (1.702 m)   Wt 215 lb (97.5 kg)   SpO2 97%   BMI 33.67 kg/m   Physical Exam Constitutional:      Appearance: Normal appearance.  HENT:     Head: Normocephalic and atraumatic.     Right Ear: Tympanic membrane normal.     Left Ear: Tympanic membrane normal.  Nose:     Comments: Left maxillary sinus tenderness Eyes:     General: No scleral icterus. Cardiovascular:     Rate and Rhythm: Normal rate and regular rhythm.  Pulmonary:     Effort: Pulmonary effort is normal.     Breath sounds: Normal breath sounds.  Musculoskeletal:     Cervical back: Neck supple.  Neurological:     Mental Status: He is alert.    ------------------------------------------------------------------------------------------------------------------------------------------------------------------------------------------------------------------- Assessment and Plan  Acute bacterial sinusitis He has had prolonged symptoms which have not improved over the past 2 weeks.  He has a left maxillary sinus  tenderness.  We will have him start doxycycline for a course of 7 days.  Also adding Tessalon Perles for cough.  Chest x-ray ordered as he is having some back pain associated with his cough   Meds ordered this encounter  Medications   doxycycline (VIBRA-TABS) 100 MG tablet    Sig: Take 1 tablet (100 mg total) by mouth 2 (two) times daily for 7 days.    Dispense:  14 tablet    Refill:  0   benzonatate (TESSALON) 200 MG capsule    Sig: Take 1 capsule (200 mg total) by mouth 2 (two) times daily as needed for cough.    Dispense:  20 capsule    Refill:  0    No follow-ups on file.    This visit occurred during the SARS-CoV-2 public health emergency.  Safety protocols were in place, including screening questions prior to the visit, additional usage of staff PPE, and extensive cleaning of exam room while observing appropriate contact time as indicated for disinfecting solutions.

## 2021-06-04 ENCOUNTER — Other Ambulatory Visit: Payer: Self-pay | Admitting: Family Medicine

## 2021-07-11 ENCOUNTER — Other Ambulatory Visit: Payer: Self-pay | Admitting: Family Medicine

## 2021-07-11 DIAGNOSIS — K2100 Gastro-esophageal reflux disease with esophagitis, without bleeding: Secondary | ICD-10-CM

## 2021-08-07 ENCOUNTER — Other Ambulatory Visit: Payer: Self-pay

## 2021-08-07 ENCOUNTER — Emergency Department
Admission: EM | Admit: 2021-08-07 | Discharge: 2021-08-07 | Disposition: A | Discharge status: Home / Self Care | Payer: Commercial Managed Care - PPO | Source: Home / Self Care

## 2021-08-07 DIAGNOSIS — J039 Acute tonsillitis, unspecified: Secondary | ICD-10-CM

## 2021-08-07 DIAGNOSIS — J069 Acute upper respiratory infection, unspecified: Secondary | ICD-10-CM | POA: Diagnosis not present

## 2021-08-07 MED ORDER — AMOXICILLIN-POT CLAVULANATE 875-125 MG PO TABS: 1.0000 | ORAL_TABLET | Freq: Two times a day (BID) | ORAL | 0 refills | Status: DC

## 2021-08-07 NOTE — ED Provider Notes (Signed)
[name]    CSN: [id number] Arrival date & time: 08/07/21  1030      History   Chief Complaint Chief Complaint  Patient presents with   Sore Throat   Nasal Congestion    HPI Damon Conrad is a 55 y.o. male.   HPI  Patient describes himself as having "poor immunity".  He states he needs "strong antibiotics" whenever he gets sick.  He states has been like this ever since he suffered from COVID last year.  He is here with sore throat, runny nose, body aches and fatigue that has been going on since Monday.  3 days ago.  He is done COVID testing at home that was negative.  He states that the sore throat is painful.  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 Authorizing Provider  amoxicillin-clavulanate (AUGMENTIN) 875-125 MG tablet Take 1 tablet by mouth every 12 (twelve) hours. 08/07/21  Yes Eustace Moore, MD  albuterol (VENTOLIN HFA) 108 (90 Base) MCG/ACT inhaler Inhale into the lungs. 06/20/20   [provider]  allopurinol (ZYLOPRIM) 300 MG tablet Take 1 tablet (300 mg total) by mouth daily. To prevent gout 05/02/19   Rodolph Bong, MD  amLODipine (NORVASC) 5 MG tablet Take 1 tablet (5 mg total) by mouth daily. 12/12/20   Everrett Coombe, DO  atorvastatin (LIPITOR) 40 MG tablet Take 1 tablet (40 mg total) by mouth daily. 12/15/19   Everrett Coombe, DO  clonazePAM (KLONOPIN) 0.5 MG tablet Take 1 tablet (0.5 mg total) by mouth at bedtime as needed for anxiety. 08/22/20   Everrett Coombe, DO  diclofenac sodium (VOLTAREN) 1 % GEL Apply 4 g topically 4 (four) times daily. To affected joint. 05/01/19   Rodolph Bong, MD  metoprolol succinate (TOPROL-XL) 100 MG 24 hr tablet TAKE 1 TABLET(100 MG) BY MOUTH DAILY WITH OR IMMEDIATELY FOLLOWING A MEAL 12/16/20   Everrett Coombe, DO  naproxen (NAPROSYN) 500 MG tablet Take 1 tablet (500 mg total) by mouth 2 (two) times daily with a meal. 11/05/20 11/05/21  Rodolph Bong, MD  omeprazole (PRILOSEC) 40 MG capsule TAKE 1 CAPSULE(40 MG) BY MOUTH DAILY 07/15/21   Everrett Coombe, DO  tiZANidine (ZANAFLEX) 4 MG tablet Take 1 tablet (4 mg total) by mouth every 8 (eight) hours as needed for muscle spasms. 10/29/20   Rodolph Bong, MD  triamcinolone (NASACORT) 55 MCG/ACT AERO nasal inhaler Place 2 sprays into the nose daily. 01/20/19   Monica Becton, MD    Family History Family History  Problem Relation Age of Onset   Alcohol abuse Father    Cancer Mother    Hypertension Mother     Social History Social History   Tobacco Use   Smoking status: Never   Smokeless tobacco: Never  Substance Use Topics   Alcohol use: Yes    Comment: Rarely   Drug use: No     Allergies   Meloxicam and Lisinopril   Review of Systems Review of Systems  See HPI Physical Exam Triage Vital Signs ED Triage Vitals [08/07/21 1043]  Enc Vitals Group     BP (!) 157/102      Pulse Rate 76     Resp 18     Temp 98.2 F (36.8 C)     Temp src      SpO2 96 %     Weight      Height      Head Circumference      Peak Flow      Pain Score 0     Pain Loc      Pain Edu?      Excl. in GC?    No data found.  Updated Vital Signs BP (!) 157/102 (BP Location: Left Arm)   Pulse 76   Temp 98.2 F (36.8 C)   Resp 18   SpO2 96%         Physical Exam Constitutional:      General: He is not in acute distress.    Appearance: He is well-developed. He is ill-appearing.  HENT:     Head: Normocephalic and atraumatic.     Right Ear: Ear canal normal. Tympanic membrane is not erythematous.     Left Ear: Ear canal normal. Tympanic membrane is not erythematous.     Ears:     Comments: Abnormal light reflex both TMs    Nose: No congestion or rhinorrhea.     Mouth/Throat:     Mouth: Mucous membranes are moist.     Pharynx: Uvula midline. Posterior oropharyngeal erythema present.     Tonsils: No tonsillar exudate or tonsillar abscesses. 2+ on the right. 3+ on the left.  Eyes:     Conjunctiva/sclera: Conjunctivae normal.     Pupils: Pupils are equal, round, and reactive to light.  Cardiovascular:     Rate and Rhythm: Normal rate and regular rhythm.     Heart sounds: Normal heart sounds.  Pulmonary:     Effort: Pulmonary effort is normal. No respiratory distress.     Breath sounds: Normal breath sounds. No wheezing or rales.  Abdominal:     General: There is no distension.     Palpations: Abdomen is soft.  Musculoskeletal:        General: Normal range of motion.     Cervical back: Normal range of motion.  Lymphadenopathy:     Cervical: Cervical adenopathy present.  Skin:    General: Skin is warm and dry.  Neurological:     Mental Status: He is alert.  Psychiatric:        Mood and Affect: Mood normal.        Behavior: Behavior normal.     UC Treatments / Results  Labs (all labs ordered are listed, but only abnormal results are displayed) Labs  Reviewed - No data to display  EKG  Radiology No results found.  Procedures Procedures (including critical care time)  Medications Ordered in UC Medications - No data to display  Initial Impression / Assessment and Plan / UC Course  I have reviewed the triage vital signs and the nursing notes.  Pertinent labs & imaging results that were available during my care of the patient were reviewed by me and considered in my medical decision making (see chart for details).     Patient is describing cough cold runny nose and sore throat with a negative COVID test.  I initially thought he likely had a viral illness, however, I am concerned regarding the unilateral tonsil swelling.  He does not have swelling that extends up the tonsillar pillar into the palate.  He is warned that if he gets worse instead of better he would need to go to the emergency room.  Follow-up with PCP Final Clinical Impressions(s) / UC Diagnoses   Final diagnoses:  Viral upper respiratory tract infection  Tonsillitis     Discharge Instructions      Take your antibiotic 2 times a day Drink lots of fluids Salt water gargles or sipping on warm tea with honey may help Take Tylenol or ibuprofen for pain and fever If your throat pain becomes worse, if you have difficulty swallowing then you may need an ear nose and throat specialist or to go to the emergency room   ED Prescriptions     Medication Sig Dispense Auth. Provider   amoxicillin-clavulanate (AUGMENTIN) 875-125 MG tablet Take 1 tablet by mouth every 12 (twelve) hours. 14 tablet Eustace Moore, MD      PDMP not reviewed this encounter.   Eustace Moore, MD 08/07/21 1201

## 2021-08-07 NOTE — Discharge Instructions (Addendum)
Take your antibiotic 2 times a day Drink lots of fluids Salt water gargles or sipping on warm tea with honey may help Take Tylenol or ibuprofen for pain and fever If your throat pain becomes worse, if you have difficulty swallowing then you may need an ear nose and throat specialist or to go to the emergency room

## 2021-08-07 NOTE — ED Triage Notes (Signed)
Pt present sore throat with coughing, runny nose and aching in legs. Pt states he took a at home covid test on Monday and last night.  The test result was negative. Pt tried otc medication with no relief. Pt Blood pressure is elevated this am, pt states he did not take his medication this morning.

## 2021-08-13 ENCOUNTER — Other Ambulatory Visit: Payer: Self-pay | Admitting: Family Medicine

## 2021-08-19 ENCOUNTER — Telehealth: Payer: Self-pay

## 2021-08-19 NOTE — Telephone Encounter (Signed)
Pt lvm stating he finished medication concerning sinus infection on last Wednesday. He woke up on Sunday morning with a sore throat.   Requesting throat medication.

## 2021-08-20 ENCOUNTER — Encounter: Payer: Self-pay | Admitting: *Deleted

## 2021-08-20 ENCOUNTER — Other Ambulatory Visit: Payer: Self-pay

## 2021-08-20 ENCOUNTER — Emergency Department
Admission: EM | Admit: 2021-08-20 | Discharge: 2021-08-20 | Disposition: A | Discharge status: Home / Self Care | Payer: Commercial Managed Care - PPO | Source: Home / Self Care | Attending: Family Medicine | Admitting: Family Medicine

## 2021-08-20 ENCOUNTER — Emergency Department (INDEPENDENT_AMBULATORY_CARE_PROVIDER_SITE_OTHER): Payer: Commercial Managed Care - PPO

## 2021-08-20 DIAGNOSIS — R059 Cough, unspecified: Secondary | ICD-10-CM

## 2021-08-20 DIAGNOSIS — J069 Acute upper respiratory infection, unspecified: Secondary | ICD-10-CM | POA: Diagnosis not present

## 2021-08-20 DIAGNOSIS — R0989 Other specified symptoms and signs involving the circulatory and respiratory systems: Secondary | ICD-10-CM | POA: Diagnosis not present

## 2021-08-20 DIAGNOSIS — J209 Acute bronchitis, unspecified: Secondary | ICD-10-CM | POA: Diagnosis not present

## 2021-08-20 MED ORDER — BENZONATATE 200 MG PO CAPS: 200.0000 mg | ORAL_CAPSULE | Freq: Two times a day (BID) | ORAL | 0 refills | Status: DC | PRN

## 2021-08-20 MED ORDER — PREDNISONE 20 MG PO TABS: ORAL_TABLET | ORAL | 0 refills | Status: DC

## 2021-08-20 MED ORDER — HYDROCODONE BIT-HOMATROP MBR 5-1.5 MG/5ML PO SOLN: 5.0000 mL | Freq: Four times a day (QID) | ORAL | 0 refills | Status: DC | PRN

## 2021-08-20 MED ORDER — DOXYCYCLINE HYCLATE 100 MG PO CAPS: 100.0000 mg | ORAL_CAPSULE | Freq: Two times a day (BID) | ORAL | 0 refills | Status: DC

## 2021-08-20 NOTE — Telephone Encounter (Signed)
Pt states he went Urgent Care this morning. Received medications for bronchitis. If symptoms aren't relieved in 2 weeks return to Dr. Ashley Royalty.

## 2021-08-20 NOTE — ED Triage Notes (Addendum)
Pt c/o productive cough, sore throat, and nasal congestion x 2 days. Denies fever. Using inhaler from last visit, cough gtts and OTC cold medication.

## 2021-08-20 NOTE — Discharge Instructions (Addendum)
Drink lots of fluids Continue Mucinex DM 2 times a day Add Tessalon 2 times a day I have prescribed hydrocodone cough syrup to use at nighttime only Take prednisone as directed Take doxycycline 2 times a day.  Take the antibiotic with food See Dr. Ashley Royalty if not improving in 2 to 3 weeks

## 2021-08-20 NOTE — ED Provider Notes (Signed)
[facility]    CSN: [id number] Arrival date & time: 08/20/21  1014      History   Chief Complaint Chief Complaint  Patient presents with   Cough    HPI Damon Conrad is a 55 y.o. male.   HPI  This patient was seen by me 13 days ago for upper respiratory infection.  He was given Augmentin.  He states he felt slightly better on the Augmentin and as soon as he went off of it he started getting worse again.  He has a productive cough, yellow mucus, shortness of breath, sore throat and nasal congestion for 2 days.  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  He states he feels like he has "lung damage" from that "double pneumonia" infection.  He states he has had pneumonia 7 times.  He feels like he has "weak lungs".  He is concerned regarding increasing shortness of breath.  Vital signs are good today with an oxygen at rest of 97%.  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 Authorizing Provider  benzonatate (TESSALON) 200 MG capsule Take 1 capsule (200 mg total) by mouth 2 (two) times daily as needed for cough. 08/20/21  Yes Eustace Moore, MD  doxycycline (VIBRAMYCIN) 100 MG capsule Take 1 capsule (100 mg total) by mouth 2 (two) times daily. 08/20/21  Yes Eustace Moore, MD  HYDROcodone bit-homatropine [facility]) 5-1.5 MG/5ML syrup Take 5 mLs by mouth every 6 (six) hours as needed for cough. 08/20/21  Yes Eustace Moore, MD  predniSONE (DELTASONE) 20 MG tablet Take 2 tablets a day for 5 days with food.  After this take 1 tablet a day for 5 days, then discontinue 08/20/21  Yes Eustace Moore, MD  albuterol (VENTOLIN HFA) 108 (90 Base) MCG/ACT inhaler Inhale into the lungs. 06/20/20   [provider]  allopurinol (ZYLOPRIM) 300 MG tablet Take 1 tablet (300 mg total) by mouth daily. To prevent gout 05/02/19   Rodolph Bong, MD  amLODipine (NORVASC) 5 MG tablet Take 1 tablet (5 mg total) by mouth daily. 12/12/20   Everrett Coombe, DO  atorvastatin (LIPITOR) 40 MG tablet Take 1 tablet (40 mg total) by mouth daily. 12/15/19   Everrett Coombe, DO  clonazePAM (KLONOPIN) 0.5 MG tablet Take 1 tablet (0.5 mg  total) by mouth at bedtime as needed for anxiety. 08/22/20   Everrett Coombe, DO  diclofenac sodium (VOLTAREN) 1 % GEL Apply 4 g topically 4 (four) times daily. To affected joint. 05/01/19   Rodolph Bong, MD  metoprolol succinate (TOPROL-XL) 100 MG 24 hr tablet TAKE 1 TABLET(100 MG) BY MOUTH DAILY WITH OR IMMEDIATELY FOLLOWING A MEAL 12/16/20   Everrett Coombe, DO  naproxen (NAPROSYN) 500 MG tablet Take 1 tablet (500 mg total) by mouth 2 (two) times daily with a meal. 11/05/20 11/05/21  Rodolph Bong, MD  omeprazole (PRILOSEC) 40 MG capsule TAKE 1 CAPSULE(40 MG) BY MOUTH DAILY 07/15/21   Everrett Coombe, DO  tiZANidine (ZANAFLEX) 4 MG tablet Take 1  tablet (4 mg total) by mouth every 8 (eight) hours as needed for muscle spasms. 10/29/20   Rodolph Bong, MD  triamcinolone (NASACORT) 55 MCG/ACT AERO nasal inhaler Place 2 sprays into the nose daily. 01/20/19   Monica Becton, MD    Family History Family History  Problem Relation Age of Onset   Alcohol abuse Father    Cancer Mother    Hypertension Mother     Social History Social History   Tobacco Use   Smoking status: Never   Smokeless tobacco: Never  Substance Use Topics   Alcohol use: Yes    Comment: Rarely   Drug use: No     Allergies   Meloxicam and Lisinopril   Review of Systems Review of Systems See HPI  Physical Exam Triage Vital Signs ED Triage Vitals  Enc Vitals Group     BP 08/20/21 1022 134/88     Pulse Rate 08/20/21 1022 70     Resp 08/20/21 1022 18     Temp 08/20/21 1022 98.4 F (36.9 C)     Temp Source 08/20/21 1022 Oral     SpO2 08/20/21 1022 97 %     Weight 08/20/21 1021 205 lb (93 kg)     Height 08/20/21 1021 5\' 8"  (1.727 m)     Head Circumference --      Peak Flow --      Pain Score 08/20/21 1021 0     Pain Loc --      Pain Edu? --      Excl. in GC? --    No data found.  Updated Vital Signs BP 134/88 (BP Location: Right Arm)   Pulse 70   Temp 98.4 F (36.9 C) (Oral)   Resp 18   Ht 5\' 8"  (1.727 m)   Wt 93 kg   SpO2 97%   BMI 31.17 kg/m      Physical Exam Constitutional:      General: He is not in acute distress.    Appearance: He is well-developed.     Comments: Appears tired  HENT:     Head: Normocephalic and atraumatic.     Right Ear: Tympanic membrane and ear canal normal.     Left Ear: Tympanic membrane and ear canal normal.     Nose: Nose normal. No rhinorrhea.     Mouth/Throat:     Mouth: Mucous membranes are moist.     Pharynx: No posterior oropharyngeal erythema.  Eyes:     Conjunctiva/sclera: Conjunctivae normal.     Pupils: Pupils are equal, round, and reactive to light.  Cardiovascular:     Rate  and Rhythm: Normal rate and regular rhythm.     Heart sounds: Normal heart sounds.  Pulmonary:  Effort: Pulmonary effort is normal. No respiratory distress.     Comments: Patient coughs with effort to take deep breath. Abdominal:     General: There is no distension.     Palpations: Abdomen is soft.  Musculoskeletal:        General: Normal range of motion.     Cervical back: Normal range of motion.  Lymphadenopathy:     Cervical: No cervical adenopathy.  Skin:    General: Skin is warm and dry.  Neurological:     General: No focal deficit present.     Mental Status: He is alert.     Gait: Gait normal.  Psychiatric:        Behavior: Behavior normal.     UC Treatments / Results  Labs (all labs ordered are listed, but only abnormal results are displayed) Labs Reviewed - No data to display  EKG   Radiology DG Chest 2 View  Result Date: 08/20/2021 CLINICAL DATA:  Cough and congestion over the last 4 days. EXAM: CHEST - 2 VIEW COMPARISON:  05/16/2021 FINDINGS: Heart size is normal. Mediastinal shadows are normal. The lungs are clear except for a calcified granuloma in the left upper lobe. No bronchial thickening. No infiltrate, mass, effusion or collapse. Pulmonary vascularity is normal. No bony abnormality. IMPRESSION: No active cardiopulmonary disease. Electronically Signed   By: Paulina Fusi M.D.   On: 08/20/2021 11:11    Procedures Procedures (including critical care time)  Medications Ordered in UC Medications - No data to display  Initial Impression / Assessment and Plan / UC Course  I have reviewed the triage vital signs and the nursing notes.  Pertinent labs & imaging results that were available during my care of the patient were reviewed by me and considered in my medical decision making (see chart for details).    Is difficult to tell whether patient has another viral infection or continuation of his prior infection.  In any event he has been sick for 3 weeks  now.  He states that he does have a history of lung disease and recurrent pneumonia.  Since he failed a course of Augmentin, I will try doxycycline for 10 days.  He is cautioned to take the doxycycline with food.  In addition he is provided with cough management and prednisone.  Importance of following up with his primary care doctor is emphasized  Final Clinical Impressions(s) / UC Diagnoses   Final diagnoses:  Viral URI with cough  Acute bronchitis, unspecified organism     Discharge Instructions      Drink lots of fluids Continue Mucinex DM 2 times a day Add Tessalon 2 times a day I have prescribed hydrocodone cough syrup to use at nighttime only Take prednisone as directed Take doxycycline 2 times a day.  Take the antibiotic with food See Dr. Ashley Royalty if not improving in 2 to 3 weeks     ED Prescriptions     Medication Sig Dispense Auth. Provider   doxycycline (VIBRAMYCIN) 100 MG capsule Take 1 capsule (100 mg total) by mouth 2 (two) times daily. 20 capsule Eustace Moore, MD   predniSONE (DELTASONE) 20 MG tablet Take 2 tablets a day for 5 days with food.  After this take 1 tablet a day for 5 days, then discontinue 15 tablet Eustace Moore, MD   HYDROcodone bit-homatropine (HYCODAN) 5-1.5 MG/5ML syrup Take 5 mLs by mouth every 6 (six) hours as needed for cough. 120 mL Eustace Moore, MD  benzonatate (TESSALON) 200 MG capsule Take 1 capsule (200 mg total) by mouth 2 (two) times daily as needed for cough. 20 capsule Eustace Moore, MD      PDMP not reviewed this encounter.   Eustace Moore, MD 08/20/21 1137

## 2021-09-02 ENCOUNTER — Telehealth: Payer: Self-pay

## 2021-09-02 NOTE — Telephone Encounter (Signed)
Pt called stating that he tested positive for COVID this morning.  Pt is requesting medications to treat symptoms.  Pt was given appt with Dr. Benjamin Stain for 09/03/2021 by Elease Hashimoto.  Tiajuana Amass, CMA

## 2021-09-03 ENCOUNTER — Telehealth (INDEPENDENT_AMBULATORY_CARE_PROVIDER_SITE_OTHER): Payer: Commercial Managed Care - PPO | Admitting: Sports Medicine

## 2021-09-03 DIAGNOSIS — U071 COVID-19: Secondary | ICD-10-CM

## 2021-09-03 MED ORDER — NIRMATRELVIR/RITONAVIR (PAXLOVID)TABLET
3.0000 | ORAL_TABLET | Freq: Two times a day (BID) | ORAL | 0 refills | Status: DC
Start: 2021-09-03 — End: 2021-09-09

## 2021-09-03 NOTE — Progress Notes (Signed)
   Patient never picked up the phone after calling 3 times.  COVID-19 virus infection This is a 55 year old male, it sounds like he tested COVID-positive yesterday, positive symptoms, he did not pick up the phone after multiple calls so I am just going to go ahead and send in Paxlovid. He will need to hold his cholesterol medication while taking Paxlovid. He can touch base with [place] when he desires.  I spent 5 total minutes of online digital evaluation and management services.  ___________________________________________ Ihor Austin. Benjamin Stain, M.D., ABFM., CAQSM. Primary Care and Sports Medicine [facility]  Adjunct Instructor of Family Medicine  [employer]

## 2021-09-03 NOTE — Assessment & Plan Note (Addendum)
This is a 55 year old male, it sounds like he tested COVID-positive yesterday, positive symptoms, he did not pick up the phone after multiple calls so I am just going to go ahead and send in Paxlovid. He will need to hold his cholesterol medication while taking Paxlovid. He can touch base with [place] when he desires.

## 2021-09-03 NOTE — Progress Notes (Signed)
Congestion in head and chest; positive COVID test yesterday. Cough, sore throat.   Been to UC twice and has been on antibiotics and prednisone over the past month.

## 2021-09-04 ENCOUNTER — Telehealth: Payer: Self-pay

## 2021-09-04 NOTE — Telephone Encounter (Signed)
Patient called in . He is requesting something to help with cough. He states he coughs all throughout the night and has not been able to sleep .   Pharmacy in chart is correct

## 2021-09-05 NOTE — Telephone Encounter (Signed)
He has Jerilynn Som, the urgent care provider also sent in 24 doses of Hycodan syrup, he should use this at night.

## 2021-09-05 NOTE — Telephone Encounter (Signed)
Pt informed.  Pt stated that he has about 1/4 of the cough syrup left and 10 Tessalone pearles.  Pt states that he has been taking the Tessalon 3-4 times per day.  Advised pt that he is only to take this medication twice daily.  Pt expressed understanding.  Advised pt to call back in a few days if no improvement or may proceed to ED if symptoms worsen.  Pt expressed understanding.  Tiajuana Amass, CMA

## 2021-09-09 ENCOUNTER — Other Ambulatory Visit: Payer: Self-pay

## 2021-09-09 ENCOUNTER — Ambulatory Visit (INDEPENDENT_AMBULATORY_CARE_PROVIDER_SITE_OTHER): Payer: Commercial Managed Care - PPO | Admitting: Sports Medicine

## 2021-09-09 ENCOUNTER — Ambulatory Visit (INDEPENDENT_AMBULATORY_CARE_PROVIDER_SITE_OTHER): Payer: Commercial Managed Care - PPO

## 2021-09-09 DIAGNOSIS — U071 COVID-19: Secondary | ICD-10-CM | POA: Diagnosis not present

## 2021-09-09 MED ORDER — HYDROCOD POLST-CPM POLST ER 10-8 MG/5ML PO SUER
5.0000 mL | Freq: Two times a day (BID) | ORAL | 0 refills | Status: DC | PRN
Start: 2021-09-09 — End: 2022-02-15

## 2021-09-09 MED ORDER — PREDNISONE 50 MG PO TABS: 50.0000 mg | ORAL_TABLET | Freq: Every day | ORAL | 0 refills | Status: DC

## 2021-09-09 MED ORDER — AZITHROMYCIN 250 MG PO TABS: ORAL_TABLET | ORAL | 0 refills | Status: DC

## 2021-09-09 NOTE — Progress Notes (Signed)
    Procedures performed today:    None.  Independent interpretation of notes and tests performed by another provider:   None.  Brief History, Exam, Impression, and Recommendations:    COVID-19 virus infection This is a pleasant 55 year old male, he does have obesity, history of respiratory failure due to COVID in the past, more recently on the second of this month we attempted to do a video visit/telephone visit, multiple calls were not answered.  Because he had called [place] letting us know he tested positive for COVID I went ahead and sent in Paxlovid, he tells me he did end up taking this, unfortunately continues to have cough, facial pain and pressure, muscle aches in the legs. Cough is productive of yellowish sputum. On exam he has coarse sounds in the left upper lobe, otherwise oropharyngeal exam is unremarkable, speaking full sentences, no nasal flaring or other signs of respiratory distress. We will add a chest x-ray, azithromycin, prednisone, Tussionex for his nocturnal cough. Return to see [place] if no better in 1 to 2 weeks.    ___________________________________________ Ihor Austin. Benjamin Stain, M.D., ABFM., CAQSM. Primary Care and Sports Medicine [facility] [facility]  Adjunct Instructor of Family Medicine  [employer]

## 2021-09-09 NOTE — Assessment & Plan Note (Addendum)
This is a pleasant 54 year old male, he does have obesity, history of respiratory failure due to COVID in the past, more recently on the second of this month we attempted to do a video visit/telephone visit, multiple calls were not answered.  Because he had called [place] letting us know he tested positive for COVID I went ahead and sent in Paxlovid, he tells me he did end up taking this, unfortunately continues to have cough, facial pain and pressure, muscle aches in the legs. Cough is productive of yellowish sputum. On exam he has coarse sounds in the left upper lobe, otherwise oropharyngeal exam is unremarkable, speaking full sentences, no nasal flaring or other signs of respiratory distress. We will add a chest x-ray, azithromycin, prednisone, Tussionex for his nocturnal cough. Return to see [place] if no better in 1 to 2 weeks.

## 2021-09-16 IMAGING — DX DG LUMBAR SPINE 2-3V
3 series · 3 of 3 positions shown · non-contrast
Comparison: 02/25/2015

CLINICAL DATA: Low back pain

EXAM:
LUMBAR SPINE - 2-3 VIEW

[l-spine ap]
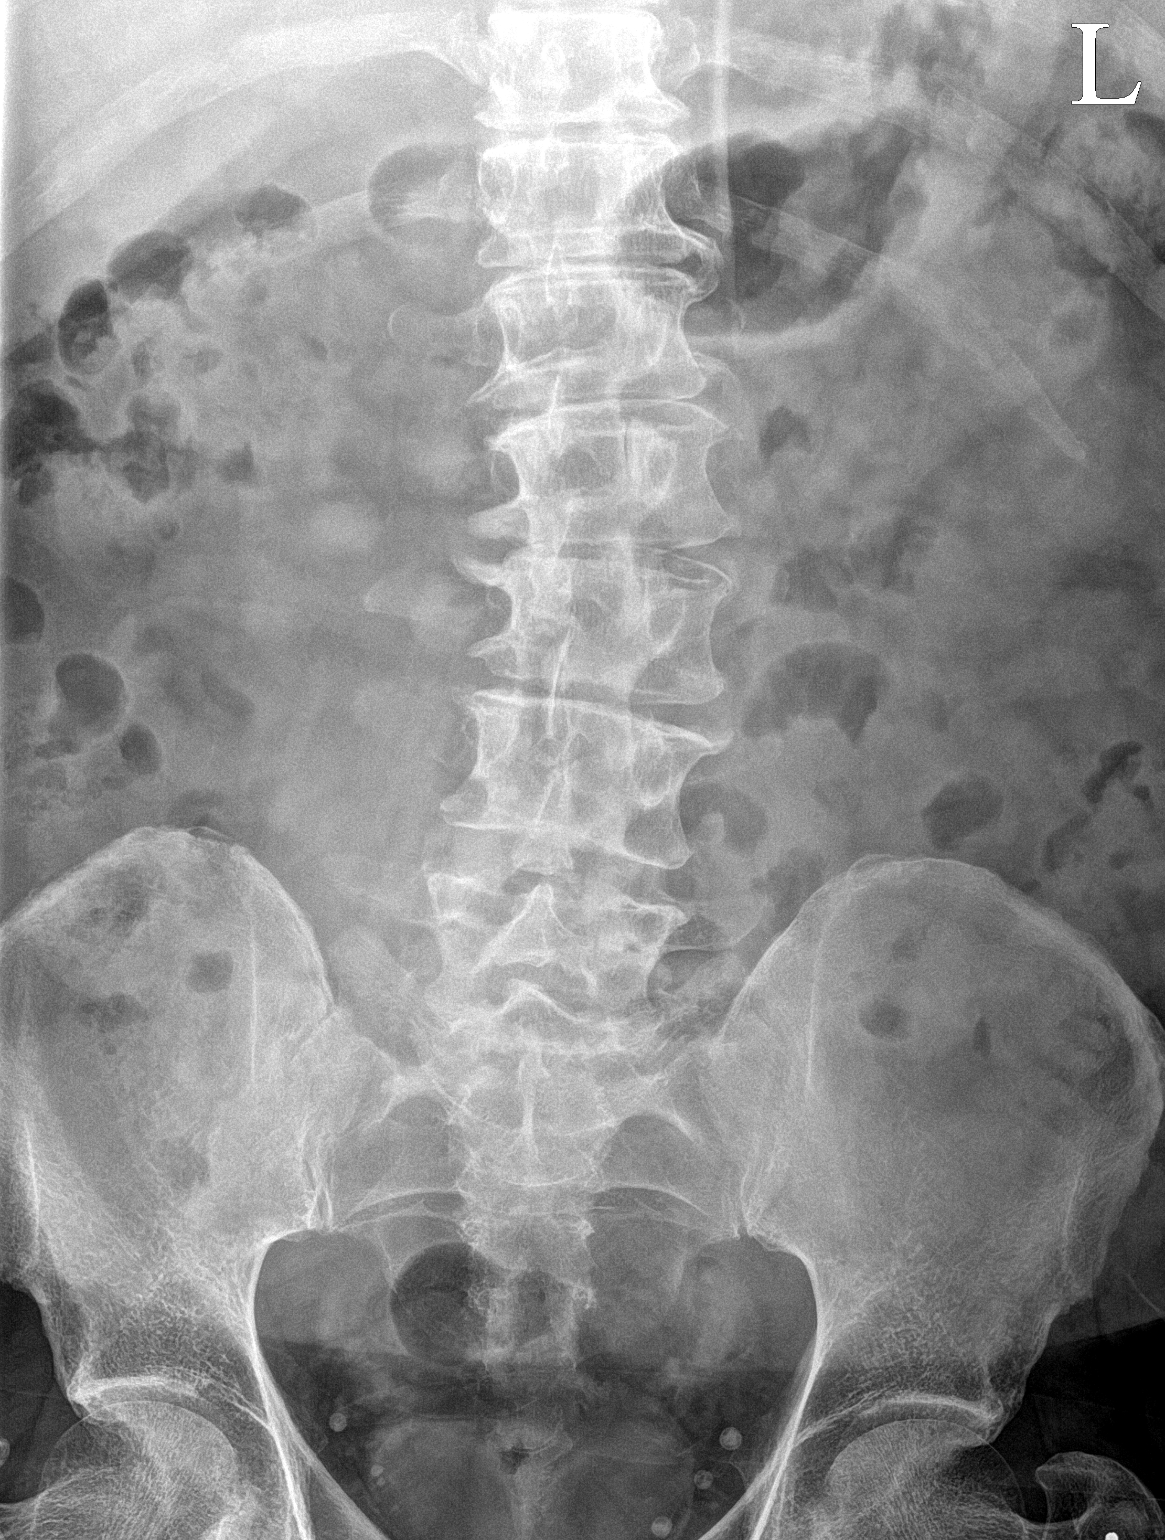

[l-spine lateral (1 of 2)]
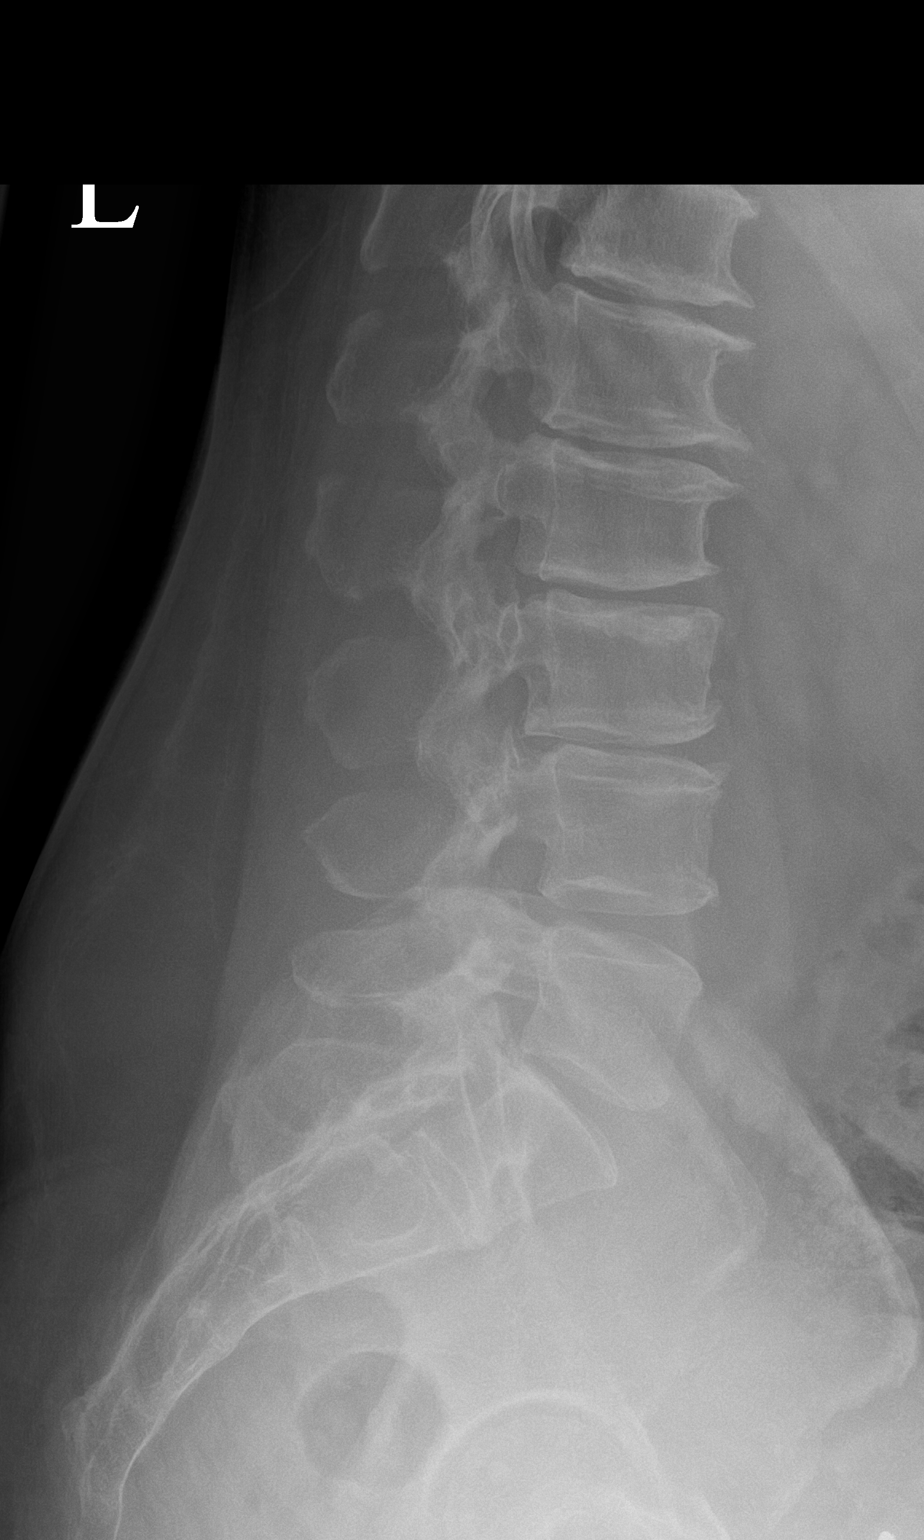

[l-spine lateral (2 of 2)]
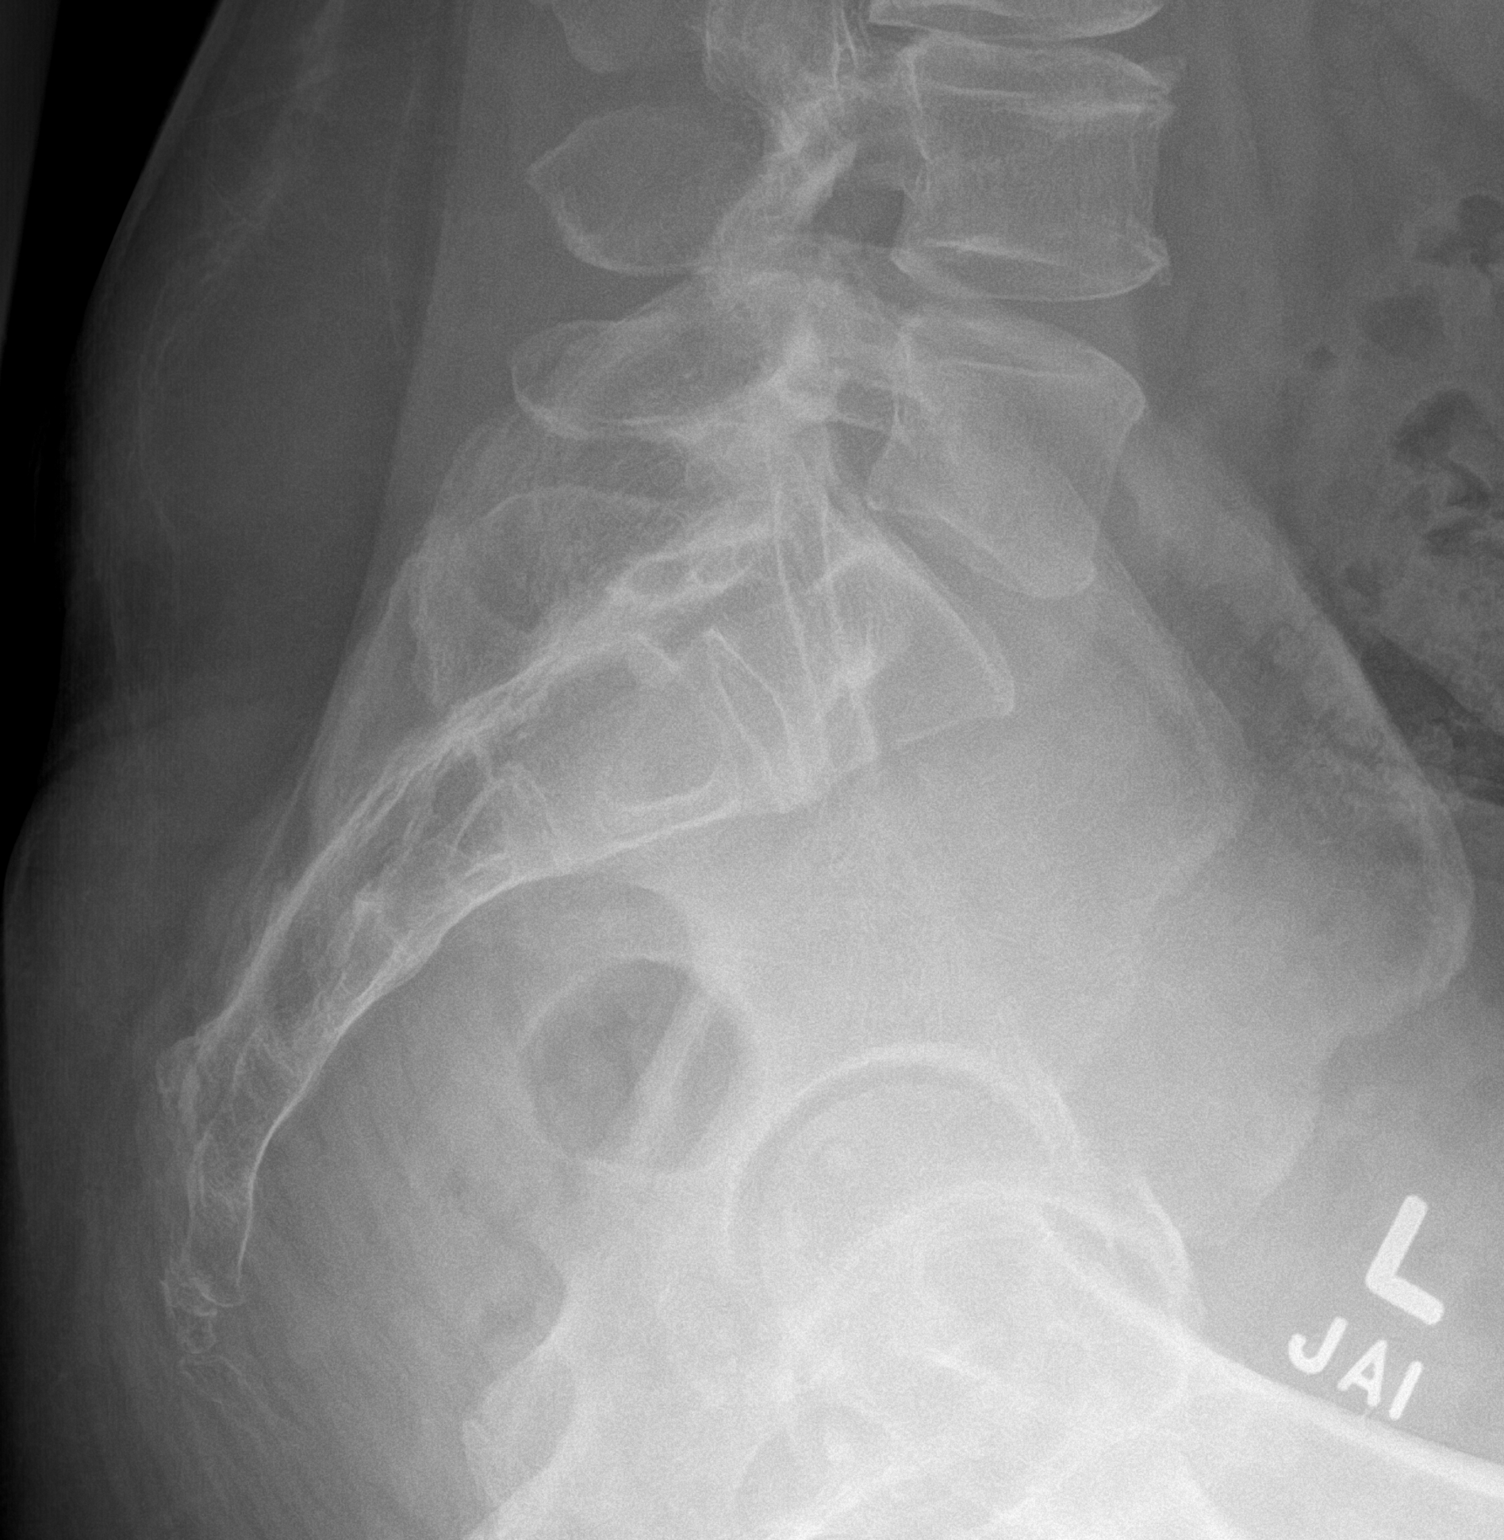

[3 of 3 positions shown; findings below may reference images not displayed]

FINDINGS: Three view radiograph lumbar spine demonstrates 5 non rib bearing
segments of the lumbar spine. There is progressive, mild lumbar
levoscoliosis, apex left at L3. No acute fracture or listhesis of
the lumbar spine. Stable 2-3 mm retrolisthesis of L2 upon L3 and L3
upon L4. There is multilevel intervertebral disc space narrowing and
endplate remodeling within the lumbar spine, most severe at T12-L4
in keeping with changes of moderate degenerative disc disease,
slightly progressive since prior examination, particularly at L2-3.
The paraspinal soft tissues are unremarkable.
IMPRESSION: Multilevel degenerative disc disease, as outlined above, slightly
progressive since prior examination with progressive mild lumbar
levoscoliosis.

## 2021-09-19 ENCOUNTER — Other Ambulatory Visit: Payer: Self-pay

## 2021-09-19 DIAGNOSIS — I10 Essential (primary) hypertension: Secondary | ICD-10-CM

## 2021-09-19 MED ORDER — METOPROLOL SUCCINATE ER 100 MG PO TB24: ORAL_TABLET | ORAL | 2 refills | Status: DC

## 2021-12-08 ENCOUNTER — Telehealth: Payer: Self-pay

## 2021-12-10 ENCOUNTER — Ambulatory Visit: Payer: Commercial Managed Care - PPO | Admitting: Sports Medicine

## 2021-12-10 NOTE — Telephone Encounter (Signed)
Pt aware to contact office for appt/video visit for meds for cold

## 2022-02-15 ENCOUNTER — Encounter: Payer: Self-pay | Admitting: Emergency Medicine

## 2022-02-15 ENCOUNTER — Emergency Department
Admission: EM | Admit: 2022-02-15 | Discharge: 2022-02-15 | Disposition: A | Discharge status: Home / Self Care | Payer: Commercial Managed Care - PPO | Source: Home / Self Care | Attending: Family Medicine | Admitting: Family Medicine

## 2022-02-15 ENCOUNTER — Emergency Department (INDEPENDENT_AMBULATORY_CARE_PROVIDER_SITE_OTHER): Payer: Commercial Managed Care - PPO

## 2022-02-15 DIAGNOSIS — R059 Cough, unspecified: Secondary | ICD-10-CM | POA: Diagnosis not present

## 2022-02-15 DIAGNOSIS — R0602 Shortness of breath: Secondary | ICD-10-CM | POA: Diagnosis not present

## 2022-02-15 DIAGNOSIS — J069 Acute upper respiratory infection, unspecified: Secondary | ICD-10-CM | POA: Diagnosis not present

## 2022-02-15 DIAGNOSIS — R079 Chest pain, unspecified: Secondary | ICD-10-CM | POA: Diagnosis not present

## 2022-02-15 DIAGNOSIS — U071 COVID-19: Secondary | ICD-10-CM

## 2022-02-15 MED ORDER — DOXYCYCLINE HYCLATE 100 MG PO CAPS: 100.0000 mg | ORAL_CAPSULE | Freq: Two times a day (BID) | ORAL | 0 refills | Status: DC

## 2022-02-15 MED ORDER — PREDNISONE 50 MG PO TABS: 50.0000 mg | ORAL_TABLET | Freq: Every day | ORAL | 0 refills | Status: DC

## 2022-02-15 MED ORDER — ALBUTEROL SULFATE HFA 108 (90 BASE) MCG/ACT IN AERS
2.0000 | INHALATION_SPRAY | RESPIRATORY_TRACT | Status: AC
Start: 2022-02-15 — End: 2022-02-15
  Administered 2022-02-15: 2 via RESPIRATORY_TRACT

## 2022-02-15 NOTE — ED Triage Notes (Addendum)
Patient presents to Urgent Care with complaints of diarrhea, sore throat, headache, body aches, cough-productive, nasal congestion-yellow since 6 days ago. Patient reports taking Pepto, Advil cold and sinus and Tylenol. Negative covid test on Monday. Denies any fever or chills. Having some back pain. Wanted a refill of the Albuterol HFA inhaler ?

## 2022-02-15 NOTE — ED Provider Notes (Signed)
?[facility] ? ? ? ?CSN: [id number] ?Arrival date & time: 02/15/22  1216 ? ? ?  ? ?History   ?Chief Complaint ?Chief Complaint  ?Patient presents with  ? Cough  ? Shortness of Breath  ? ? ?HPI ?Josephina GipJames Lynde is a 56 y.o. male.  ? ?HPI ? ?Patient states that this is the fourth episode he has had with bronchitis this year (2023).  He states that he has sore throat headache body aches and productive cough for 6 going on 7 days.  He also initially had some diarrhea.  He said he did a COVID test at home that was negative.  He has been taking Pepto-Bismol, Advil Cold and Sinus, Tylenol, and using his albuterol.  In spite of this he continues to have cough and shortness of breath.  He has been on several courses of antibiotics.  He states he was completely well before the symptoms started again last week ? ?Past Medical History:  ?Diagnosis Date  ? GERD (gastroesophageal reflux disease)   ? Gout   ? HTN (hypertension), benign   ? Hyperlipidemia   ? ? ?Patient Active Problem List  ? Diagnosis Date Noted  ? Acute bacterial sinusitis 05/17/2021  ? Pain of left calf 12/13/2020  ? Upper respiratory infection 10/15/2020  ? Panic attack 08/25/2020  ? Chest pain 07/14/2020  ? COVID-19 virus infection 06/24/2020  ? Hyponatremia 06/14/2020  ? Elevated liver enzymes 06/08/2020  ? Recurrent maxillary sinusitis 01/20/2019  ? BMI 31.0-31.9,adult 12/16/2017  ? Haglund's deformity of right heel 09/15/2016  ? Plantar fasciitis, right 06/15/2016  ? Chronic gout 02/21/2016  ? Right lumbar radiculopathy 02/25/2015  ? HLD (hyperlipidemia) 05/08/2014  ? Essential hypertension, benign 05/07/2014  ? GERD (gastroesophageal reflux disease) 05/07/2014  ? ? ?Past Surgical History:  ?Procedure Laterality Date  ? ABDOMINAL HERNIA REPAIR Bilateral   ? x 2  ? HERNIA REPAIR    ? ? ? ? ? ?Home Medications   ? ?Prior to Admission medications   ?Medication Sig Start Date End Date Taking? Authorizing Provider  ?albuterol (VENTOLIN HFA) 108 (90 Base)  MCG/ACT inhaler Inhale into the lungs. 06/20/20  Yes [provider]  ?doxycycline (VIBRAMYCIN) 100 MG capsule Take 1 capsule (100 mg total) by mouth 2 (two) times daily. 02/15/22  Yes Eustace MooreNelson, Jaheim Canino Sue, MD  ?metoprolol succinate (TOPROL-XL) 100 MG 24 hr tablet TAKE 1 TABLET(100 MG) BY MOUTH DAILY WITH OR IMMEDIATELY FOLLOWING A MEAL 09/19/21  Yes Everrett CoombeMatthews, Cody, DO  ?omeprazole (PRILOSEC) 40 MG capsule TAKE 1 CAPSULE(40 MG) BY MOUTH DAILY 07/15/21  Yes Everrett CoombeMatthews, Cody, DO  ?allopurinol (ZYLOPRIM) 300 MG tablet Take 1 tablet (300 mg total) by mouth daily. To prevent gout 05/02/19   Rodolph Bongorey, Evan S, MD  ?amLODipine (NORVASC) 5 MG tablet Take 1 tablet (5 mg total) by mouth daily. 12/12/20   Everrett CoombeMatthews, Cody, DO  ?clonazePAM (KLONOPIN) 0.5 MG tablet Take 1 tablet (0.5 mg total) by mouth at bedtime as needed for anxiety. 08/22/20   Everrett CoombeMatthews, Cody, DO  ?predniSONE (DELTASONE) 50 MG tablet Take 1 tablet (50 mg total) by mouth daily. 02/15/22   Eustace MooreNelson, Lovelle Deitrick Sue, MD  ? ? ?Family History ?Family History  ?Problem Relation Age of Onset  ? Alcohol abuse Father   ? Cancer Mother   ? Hypertension Mother   ? ? ?Social History ?Social History  ? ?Tobacco Use  ? Smoking status: Never  ? Smokeless tobacco: Never  ?Substance Use Topics  ? Alcohol use: Yes  ?  Comment: Rarely  ? Drug use: No  ? ? ? ?Allergies   ?Meloxicam and Lisinopril ? ? ?Review of Systems ?Review of Systems ?See HPI ? ?Physical Exam ?Triage Vital Signs ?ED Triage Vitals  ?Enc Vitals Group  ?   BP 02/15/22 1235 134/84  ?   Pulse Rate 02/15/22 1235 74  ?   Resp 02/15/22 1235 18  ?   Temp 02/15/22 1235 98.5 ?F (36.9 ?C)  ?   Temp Source 02/15/22 1235 Oral  ?   SpO2 02/15/22 1235 97 %  ?   Weight --   ?   Height --   ?   Head Circumference --   ?   Peak Flow --   ?   Pain Score 02/15/22 1232 4  ?   Pain Loc --   ?   Pain Edu? --   ?   Excl. in GC? --   ? ?No data found. ? ?Updated Vital Signs ?BP 134/84 (BP Location: Right Arm)   Pulse 74   Temp 98.5 ?F (36.9 ?C)  (Oral)   Resp 18   SpO2 97%  ?   ? ?Physical Exam ?Constitutional:   ?   General: He is not in acute distress. ?   Appearance: He is well-developed.  ?   Comments: Overweight.  Appears tired  ?HENT:  ?   Head: Normocephalic and atraumatic.  ?   Mouth/Throat:  ?   Comments: Mouth slightly dry ?Eyes:  ?   Conjunctiva/sclera: Conjunctivae normal.  ?   Pupils: Pupils are equal, round, and reactive to light.  ?Cardiovascular:  ?   Rate and Rhythm: Normal rate and regular rhythm.  ?   Heart sounds: Normal heart sounds.  ?Pulmonary:  ?   Effort: Pulmonary effort is normal. No respiratory distress.  ?   Breath sounds: Examination of the right-middle field reveals rhonchi. Examination of the left-middle field reveals rhonchi. Wheezing and rhonchi present.  ?   Comments: Few scattered end inspiratory wheeze.  Central rhonchi. ?Chest:  ?   Chest wall: No tenderness. There is no dullness to percussion.  ?Abdominal:  ?   General: There is no distension.  ?   Palpations: Abdomen is soft.  ?Musculoskeletal:     ?   General: Normal range of motion.  ?   Cervical back: Normal range of motion.  ?Lymphadenopathy:  ?   Cervical: No cervical adenopathy.  ?Skin: ?   General: Skin is warm and dry.  ?Neurological:  ?   Mental Status: He is alert.  ?Psychiatric:     ?   Mood and Affect: Mood normal.     ?   Behavior: Behavior normal.  ? ? ? ?UC Treatments / Results  ?Labs ?(all labs ordered are listed, but only abnormal results are displayed) ?Labs Reviewed - No data to display ? ?EKG ? ? ?Radiology ?DG Chest 2 View ? ?Result Date: 02/15/2022 ?CLINICAL DATA:  Cough shortness of breath, and right-sided chest pain. EXAM: CHEST - 2 VIEW COMPARISON:  09/09/2021 FINDINGS: The heart size and mediastinal contours are within normal limits. Both lungs are clear. Tiny calcified granuloma again seen in the left upper lobe the visualized skeletal structures are unremarkable. IMPRESSION: No active cardiopulmonary disease. Electronically Signed   By:  Danae Orleans M.D.   On: 02/15/2022 13:30   ? ?Procedures ?Procedures (including critical care time) ? ?Medications Ordered in UC ?Medications  ?albuterol (VENTOLIN HFA) 108 (90 Base) MCG/ACT inhaler 2 puff (2  puffs Inhalation Given 02/15/22 1330)  ? ? ?Initial Impression / Assessment and Plan / UC Course  ?I have reviewed the triage vital signs and the nursing notes. ? ?Pertinent labs & imaging results that were available during my care of the patient were reviewed by me and considered in my medical decision making (see chart for details). ? ?  ?Final Clinical Impressions(s) / UC Diagnoses  ? ?Final diagnoses:  ?Viral upper respiratory tract infection  ? ? ? ?Discharge Instructions   ? ?  ?Take doxycycline 2 times a day with food ?Take prednisone once a day ?Take over-the-counter cough and cold medicines ?See your doctor if not improving by next week ? ? ? ?ED Prescriptions   ? ? Medication Sig Dispense Auth. Provider  ? predniSONE (DELTASONE) 50 MG tablet Take 1 tablet (50 mg total) by mouth daily. 5 tablet Eustace Moore, MD  ? doxycycline (VIBRAMYCIN) 100 MG capsule Take 1 capsule (100 mg total) by mouth 2 (two) times daily. 20 capsule Eustace Moore, MD  ? ?  ? ?PDMP not reviewed this encounter. ?  ?Eustace Moore, MD ?02/15/22 1409 ? ?

## 2022-02-15 NOTE — Discharge Instructions (Addendum)
Take doxycycline 2 times a day with food ?Take prednisone once a day ?Take over-the-counter cough and cold medicines ?See your doctor if not improving by next week ? ?

## 2022-04-06 ENCOUNTER — Other Ambulatory Visit: Payer: Self-pay | Admitting: Family Medicine

## 2022-04-06 DIAGNOSIS — K2100 Gastro-esophageal reflux disease with esophagitis, without bleeding: Secondary | ICD-10-CM

## 2022-05-01 ENCOUNTER — Ambulatory Visit (INDEPENDENT_AMBULATORY_CARE_PROVIDER_SITE_OTHER): Payer: Commercial Managed Care - PPO

## 2022-05-01 ENCOUNTER — Ambulatory Visit: Payer: Commercial Managed Care - PPO | Admitting: Family Medicine

## 2022-05-01 VITALS — BP 164/98 | HR 58 | Ht 68.0 in | Wt 213.6 lb

## 2022-05-01 DIAGNOSIS — M5416 Radiculopathy, lumbar region: Secondary | ICD-10-CM

## 2022-05-01 MED ORDER — GABAPENTIN 300 MG PO CAPS: 300.0000 mg | ORAL_CAPSULE | Freq: Three times a day (TID) | ORAL | 3 refills | Status: DC | PRN

## 2022-05-01 MED ORDER — PREDNISONE 50 MG PO TABS: 50.0000 mg | ORAL_TABLET | Freq: Every day | ORAL | 0 refills | Status: DC

## 2022-05-01 NOTE — Progress Notes (Signed)
   I, Philbert Riser, LAT, ATC acting as a scribe for Clementeen Graham, MD.  Subjective:    CC: Back pain  HPI: Pt is a 56 y/o male c/o back pain. Pt was last seen by Dr. Denyse Amass on 10/29/20 for chronic L-side LBP w/ sciatica into the L leg. Today, pt c/o low back pain ongoing for 1+ month. Pt was picking up heavy boxes at work when he noticed the back pain starting. Pt locates pain to R buttock. Pt will only have radiating pain, when it's exacerbated.  Radiating pain: yes- when exacerbates LE numbness/tingling: yes LE weakness: no Aggravates: standing, walking,  Treatments tried: meloxicam, salonpas, ice hot, IBU,Tylenol  Dx imaging: 10/29/20 L-spine XR  Pertinent review of Systems: No fevers or chills  Relevant historical information: Hypertension   Objective:    Vitals:   05/01/22 0956  BP: (!) 164/98  Pulse: (!) 58  SpO2: 96%   General: Well Developed, well nourished, and in no acute distress.   MSK: L-spine: Nontender midline.  Mildly tender palpation right lumbar paraspinal musculature. Significantly decreased lumbar motion.  Decreased motion to extension rotation and lateral flexion to the right. Lower extremity strength is intact. Reflexes are intact and Positive right-sided slump test.  Lab and Radiology Results  X-ray images L-spine obtained today personally and independently interpreted DDD worse at L1-L2 3.  No acute fractures. Await formal radiology review    Impression and Recommendations:    Assessment and Plan: 56 y.o. male with acute exacerbation of chronic low back pain with now lumbar radiculopathy down the right leg in a L5 dermatomal pattern.  Plan for short course of prednisone and trial of gabapentin.  If this is not sufficient we will add formal physical therapy.  Home exercise program taught today in clinic.  Consider MRI in the future again if not improving.Marland Kitchen  PDMP not reviewed this encounter. Orders Placed This Encounter  Procedures   DG  Lumbar Spine 2-3 Views    Standing Status:   Future    Number of Occurrences:   1    Standing Expiration Date:   05/02/2023    Order Specific Question:   Reason for Exam (SYMPTOM  OR DIAGNOSIS REQUIRED)    Answer:   eval rt low back pain and lumbar rad Rt l5    Order Specific Question:   Preferred imaging location?    Answer:   [name]   Meds ordered this encounter  Medications   predniSONE (DELTASONE) 50 MG tablet    Sig: Take 1 tablet (50 mg total) by mouth daily.    Dispense:  5 tablet    Refill:  0   gabapentin (NEURONTIN) 300 MG capsule    Sig: Take 1 capsule (300 mg total) by mouth 3 (three) times daily as needed.    Dispense:  90 capsule    Refill:  3    Discussed warning signs or symptoms. Please see discharge instructions. Patient expresses understanding.   The above documentation has been reviewed and is accurate and complete Clementeen Graham, M.D.

## 2022-05-01 NOTE — Patient Instructions (Addendum)
Thank you for coming in today.   Please get an Xray today before you leave   Take the prednisone daily  Take gabapenitin mostly at bedtime.   If not improving let me know.  Next step is physical therapy.   If still not better we can do an MRI.

## 2022-05-11 NOTE — Progress Notes (Signed)
Lumbar spine x-ray does show some arthritis changes.

## 2022-05-13 ENCOUNTER — Ambulatory Visit: Payer: Commercial Managed Care - PPO | Admitting: Family Medicine

## 2022-05-21 ENCOUNTER — Other Ambulatory Visit: Payer: Self-pay | Admitting: Physical Therapy

## 2022-05-21 ENCOUNTER — Telehealth: Payer: Self-pay | Admitting: Family Medicine

## 2022-05-21 DIAGNOSIS — M5416 Radiculopathy, lumbar region: Secondary | ICD-10-CM

## 2022-05-21 NOTE — Telephone Encounter (Signed)
Patient called stating that he is still having a back pain. He asked if Dr Denyse Amass would be able to order an MRI (to [facility]).

## 2022-05-21 NOTE — Telephone Encounter (Signed)
Done.  Lumbar MRI ordered to [facility].

## 2022-06-02 ENCOUNTER — Ambulatory Visit (INDEPENDENT_AMBULATORY_CARE_PROVIDER_SITE_OTHER): Payer: Commercial Managed Care - PPO

## 2022-06-02 DIAGNOSIS — M5416 Radiculopathy, lumbar region: Secondary | ICD-10-CM

## 2022-06-03 NOTE — Progress Notes (Signed)
MRI lumbar spine shows some arthritis changes and some mild spinal stenosis.  This could cause some back pain and leg pain.  Would you like me to get you set up for a back injection to help with the leg pain?

## 2022-06-19 ENCOUNTER — Other Ambulatory Visit: Payer: Self-pay

## 2022-06-22 ENCOUNTER — Telehealth: Payer: Self-pay

## 2022-06-22 ENCOUNTER — Other Ambulatory Visit: Payer: Self-pay

## 2022-06-22 NOTE — Telephone Encounter (Signed)
Patient left a vm msg requesting a med refill for his blood pressure medication. Refill is denied. Patient needs an appointment with provider. Last seen on 05/16/21. Please contact patient to schedule an appointment for medication refill. Thanks in advance.

## 2022-06-23 ENCOUNTER — Other Ambulatory Visit: Payer: Self-pay

## 2022-06-23 DIAGNOSIS — I10 Essential (primary) hypertension: Secondary | ICD-10-CM

## 2022-06-23 MED ORDER — AMLODIPINE BESYLATE 5 MG PO TABS: 5.0000 mg | ORAL_TABLET | Freq: Every day | ORAL | 0 refills | Status: DC

## 2022-06-23 MED ORDER — METOPROLOL SUCCINATE ER 100 MG PO TB24
ORAL_TABLET | ORAL | 0 refills | Status: DC
Start: 2022-06-23 — End: 2022-07-03

## 2022-06-23 NOTE — Telephone Encounter (Signed)
Task completed. Sent in a 14 day supply of both amlodipine and metoprolol rxs to the preferred pharmacy.

## 2022-06-23 NOTE — Telephone Encounter (Signed)
Patient has been scheduled for 07/03/22 with PCP & needed a refill on BP meds up until appt date sent to pharmacy below. [facility]  [id number] - [place], [place] - [address] AT [facility] [address] Phone:  [phone]  Fax:  [phone]

## 2022-07-03 ENCOUNTER — Ambulatory Visit (INDEPENDENT_AMBULATORY_CARE_PROVIDER_SITE_OTHER): Payer: Commercial Managed Care - PPO | Admitting: Family Medicine

## 2022-07-03 ENCOUNTER — Encounter: Payer: Self-pay | Admitting: Family Medicine

## 2022-07-03 VITALS — BP 128/79 | HR 60 | Ht 68.0 in | Wt 215.1 lb

## 2022-07-03 DIAGNOSIS — E782 Mixed hyperlipidemia: Secondary | ICD-10-CM | POA: Diagnosis not present

## 2022-07-03 DIAGNOSIS — Z1211 Encounter for screening for malignant neoplasm of colon: Secondary | ICD-10-CM

## 2022-07-03 DIAGNOSIS — I10 Essential (primary) hypertension: Secondary | ICD-10-CM

## 2022-07-03 DIAGNOSIS — M5416 Radiculopathy, lumbar region: Secondary | ICD-10-CM

## 2022-07-03 DIAGNOSIS — K2100 Gastro-esophageal reflux disease with esophagitis, without bleeding: Secondary | ICD-10-CM

## 2022-07-03 DIAGNOSIS — Z13220 Encounter for screening for lipoid disorders: Secondary | ICD-10-CM | POA: Diagnosis not present

## 2022-07-03 DIAGNOSIS — Z125 Encounter for screening for malignant neoplasm of prostate: Secondary | ICD-10-CM

## 2022-07-03 DIAGNOSIS — M1A9XX0 Chronic gout, unspecified, without tophus (tophi): Secondary | ICD-10-CM | POA: Diagnosis not present

## 2022-07-03 MED ORDER — GABAPENTIN 300 MG PO CAPS: 300.0000 mg | ORAL_CAPSULE | Freq: Three times a day (TID) | ORAL | 3 refills | Status: DC | PRN

## 2022-07-03 MED ORDER — METOPROLOL SUCCINATE ER 100 MG PO TB24: ORAL_TABLET | ORAL | 2 refills | Status: DC

## 2022-07-03 MED ORDER — AMLODIPINE BESYLATE 5 MG PO TABS: 5.0000 mg | ORAL_TABLET | Freq: Every day | ORAL | 2 refills | Status: DC

## 2022-07-03 MED ORDER — ALLOPURINOL 300 MG PO TABS: 300.0000 mg | ORAL_TABLET | Freq: Every day | ORAL | 2 refills | Status: DC

## 2022-07-03 MED ORDER — OMEPRAZOLE 40 MG PO CPDR: DELAYED_RELEASE_CAPSULE | ORAL | 2 refills | Status: DC

## 2022-07-03 NOTE — Progress Notes (Signed)
Damon Conrad - 56 y.o. male MRN [id number]  Date of birth: May 10, 1966  Subjective No chief complaint on file.   HPI Damon Conrad is a 56 y.o. male here today for follow up visit.    He continues on amlodipine and metoprolol for management of HTN.  He is doing well with current medications.  Denies side effects.  He has not had chest pain ,headache, shortness of breath,  or vision changes.   No recent gout flares.  Continue allopurinol.  Doing well at current strength.  ROS:  A comprehensive ROS was completed and negative except as noted per HPI   Allergies  Allergen Reactions   Meloxicam     rash   Lisinopril Other (See Comments)    Patient reports "makes him feel off".     Past Medical History:  Diagnosis Date   GERD (gastroesophageal reflux disease)    Gout    HTN (hypertension), benign    Hyperlipidemia     Past Surgical History:  Procedure Laterality Date   ABDOMINAL HERNIA REPAIR Bilateral    x 2   HERNIA REPAIR      Social History   Socioeconomic History   Marital status: Single    Spouse name: Not on file   Number of children: Not on file   Years of education: Not on file   Highest education level: Not on file  Occupational History   Not on file  Tobacco Use   Smoking status: Never   Smokeless tobacco: Never  Substance and Sexual Activity   Alcohol use: Yes    Comment: Rarely   Drug use: No   Sexual activity: Yes    Partners: Female  Other Topics Concern   Not on file  Social History Narrative   Not on file   Social Determinants of Health   Financial Resource Strain: Not on file  Food Insecurity: Not on file  Transportation Needs: Not on file  Physical Activity: Not on file  Stress: Not on file  Social Connections: Not on file    Family History  Problem Relation Age of Onset   Alcohol abuse Father    Cancer Mother    Hypertension Mother     Health Maintenance  Topic Date Due   Zoster Vaccines- Shingrix (1 of 2) 10/02/2022  (Originally 06/20/2014)   COVID-19 Vaccine (2 - Booster for Janssen series) 12/03/2022 (Originally 11/02/2020)   Fecal DNA (Cologuard)  07/04/2023 (Originally 06/20/2009)   TETANUS/TDAP  09/20/2026 (Originally 06/21/1983)   Hepatitis C Screening  Completed   HIV Screening  Completed   HPV VACCINES  Aged Out   INFLUENZA VACCINE  Discontinued     ----------------------------------------------------------------------------------------------------------------------------------------------------------------------------------------------------------------- Physical Exam BP 128/79   Pulse 60   Ht 5\' 8"  (1.727 m)   Wt 215 lb 1.9 oz (97.6 kg)   SpO2 95%   BMI 32.71 kg/m   Physical Exam Constitutional:      Appearance: Normal appearance.  Eyes:     General: No scleral icterus. Cardiovascular:     Rate and Rhythm: Normal rate and regular rhythm.  Pulmonary:     Effort: Pulmonary effort is normal.     Breath sounds: Normal breath sounds.  Musculoskeletal:     Cervical back: Neck supple.  Neurological:     Mental Status: He is alert.  Psychiatric:        Mood and Affect: Mood normal.        Behavior: Behavior normal.     ------------------------------------------------------------------------------------------------------------------------------------------------------------------------------------------------------------------- Assessment and  Plan  Essential hypertension, benign Blood pressure is well controlled at this time.  Recommend continuation of amlodipine and metoprolol at current strength.  Chronic gout Doing well with allopurinol at current strength.  Updating uric acid levels.  Right lumbar radiculopathy Remains on gabapentin as needed.   Meds ordered this encounter  Medications   allopurinol (ZYLOPRIM) 300 MG tablet    Sig: Take 1 tablet (300 mg total) by mouth daily. To prevent gout    Dispense:  90 tablet    Refill:  2    Replaces 100mg  dose   amLODipine  (NORVASC) 5 MG tablet    Sig: Take 1 tablet (5 mg total) by mouth daily.    Dispense:  90 tablet    Refill:  2   gabapentin (NEURONTIN) 300 MG capsule    Sig: Take 1 capsule (300 mg total) by mouth 3 (three) times daily as needed.    Dispense:  90 capsule    Refill:  3   metoprolol succinate (TOPROL-XL) 100 MG 24 hr tablet    Sig: TAKE 1 TABLET(100 MG) BY MOUTH DAILY WITH OR IMMEDIATELY FOLLOWING A MEAL    Dispense:  90 tablet    Refill:  2   omeprazole (PRILOSEC) 40 MG capsule    Sig: TAKE 1 CAPSULE(40 MG) BY MOUTH DAILY    Dispense:  90 capsule    Refill:  2    Return in about 6 months (around 01/01/2023) for HTN.    This visit occurred during the SARS-CoV-2 public health emergency.  Safety protocols were in place, including screening questions prior to the visit, additional usage of staff PPE, and extensive cleaning of exam room while observing appropriate contact time as indicated for disinfecting solutions.

## 2022-07-04 LAB — COMPLETE METABOLIC PANEL WITH GFR
AG Ratio: 1.5 (calc) (ref 1.0–2.5)
ALT: 21 U/L (ref 9–46)
AST: 19 U/L (ref 10–35)
Albumin: 4 g/dL (ref 3.6–5.1)
Alkaline phosphatase (APISO): 66 U/L (ref 35–144)
BUN: 17 mg/dL (ref 7–25)
CO2: 27 mmol/L (ref 20–32)
Calcium: 9.2 mg/dL (ref 8.6–10.3)
Chloride: 105 mmol/L (ref 98–110)
Creat: 1.27 mg/dL (ref 0.70–1.30)
Globulin: 2.7 g/dL (calc) (ref 1.9–3.7)
Glucose, Bld: 102 mg/dL (ref 65–139)
Potassium: 4.5 mmol/L (ref 3.5–5.3)
Sodium: 140 mmol/L (ref 135–146)
Total Bilirubin: 0.5 mg/dL (ref 0.2–1.2)
Total Protein: 6.7 g/dL (ref 6.1–8.1)
eGFR: 65 mL/min/{1.73_m2} (ref >=60)

## 2022-07-04 LAB — CBC WITH DIFFERENTIAL/PLATELET
Absolute Monocytes: 575 cells/uL (ref 200–950)
Basophils Absolute: 16 cells/uL (ref 0–200)
Basophils Relative: 0.2 %
Eosinophils Absolute: 203 cells/uL (ref 15–500)
Eosinophils Relative: 2.5 %
HCT: 41.9 % (ref 38.5–50.0)
Hemoglobin: 14.4 g/dL (ref 13.2–17.1)
Lymphs Abs: 2649 cells/uL (ref 850–3900)
MCH: 30.8 pg (ref 27.0–33.0)
MCHC: 34.4 g/dL (ref 32.0–36.0)
MCV: 89.5 fL (ref 80.0–100.0)
MPV: 10.8 fL (ref 7.5–12.5)
Monocytes Relative: 7.1 %
Neutro Abs: 4658 cells/uL (ref 1500–7800)
Neutrophils Relative %: 57.5 %
Platelets: 173 10*3/uL (ref 140–400)
RBC: 4.68 10*6/uL (ref 4.20–5.80)
RDW: 12.9 % (ref 11.0–15.0)
Total Lymphocyte: 32.7 %
WBC: 8.1 10*3/uL (ref 3.8–10.8)

## 2022-07-04 LAB — LIPID PANEL W/REFLEX DIRECT LDL
Cholesterol: 157 mg/dL (ref <200)
HDL: 29 mg/dL — ABNORMAL LOW (ref >=40)
LDL Cholesterol (Calc): 91 mg/dL (calc)
Non-HDL Cholesterol (Calc): 128 mg/dL (calc) (ref <130)
Total CHOL/HDL Ratio: 5.4 (calc) — ABNORMAL HIGH (ref <5.0)
Triglycerides: 279 mg/dL — ABNORMAL HIGH (ref <150)

## 2022-07-04 LAB — PSA: PSA: 0.32 ng/mL (ref <=4.00)

## 2022-07-04 LAB — TSH: TSH: 3.52 mIU/L (ref 0.40–4.50)

## 2022-07-04 LAB — URIC ACID: Uric Acid, Serum: 9.2 mg/dL — ABNORMAL HIGH (ref 4.0–8.0)

## 2022-07-06 NOTE — Assessment & Plan Note (Signed)
Doing well with allopurinol at current strength.  Updating uric acid levels.

## 2022-07-06 NOTE — Assessment & Plan Note (Signed)
Blood pressure is well controlled at this time.  Recommend continuation of amlodipine and metoprolol at current strength.

## 2022-07-06 NOTE — Assessment & Plan Note (Signed)
Remains on gabapentin as needed.

## 2022-07-25 ENCOUNTER — Other Ambulatory Visit: Payer: Self-pay | Admitting: Family Medicine

## 2022-07-27 MED ORDER — ALBUTEROL SULFATE HFA 108 (90 BASE) MCG/ACT IN AERS: 2.0000 | INHALATION_SPRAY | Freq: Four times a day (QID) | RESPIRATORY_TRACT | 3 refills | Status: DC | PRN

## 2022-07-29 LAB — COLOGUARD: COLOGUARD: NEGATIVE

## 2022-08-16 ENCOUNTER — Ambulatory Visit
Admission: EM | Admit: 2022-08-16 | Discharge: 2022-08-16 | Disposition: A | Discharge status: Home / Self Care | Payer: Commercial Managed Care - PPO | Attending: Family Medicine | Admitting: Family Medicine

## 2022-08-16 ENCOUNTER — Encounter: Payer: Self-pay | Admitting: Emergency Medicine

## 2022-08-16 DIAGNOSIS — J329 Chronic sinusitis, unspecified: Secondary | ICD-10-CM | POA: Diagnosis not present

## 2022-08-16 DIAGNOSIS — J0111 Acute recurrent frontal sinusitis: Secondary | ICD-10-CM | POA: Diagnosis not present

## 2022-08-16 MED ORDER — AZITHROMYCIN 250 MG PO TABS: ORAL_TABLET | ORAL | 0 refills | Status: DC

## 2022-08-16 MED ORDER — PREDNISONE 50 MG PO TABS: ORAL_TABLET | ORAL | 0 refills | Status: DC

## 2022-08-16 MED ORDER — HYDROCOD POLI-CHLORPHE POLI ER 10-8 MG/5ML PO SUER: 5.0000 mL | Freq: Two times a day (BID) | ORAL | 0 refills | Status: DC | PRN

## 2022-08-16 NOTE — Discharge Instructions (Signed)
Drink lots of water Take the azithromycin antibiotic as prescribed.  You take 2 pills today then 1 a day until gone Take the prednisone once a day for 5 days I have refilled your cough medicine.  This may cause drowsiness.  Do not drive on this cough medicine See your doctor if not improving in a week or so

## 2022-08-16 NOTE — ED Triage Notes (Signed)
Sinus  pain & pressure x 3 weeks OTC  cold medicines  Flonase prescription  Negative home  COVID tests

## 2022-08-16 NOTE — ED Provider Notes (Signed)
[facility]    CSN: [id number] Arrival date & time: 08/16/22  1523      History   Chief Complaint Chief Complaint  Patient presents with   Facial Pain    HPI Damon Conrad is a 56 y.o. male.   HPI  Patient is known to me from prior visits.  He has recurring sinusitis.  Currently has been sick for 3 weeks.  Sinus pressure and pain, thick sinus drainage, sore throat, hoarse voice, coughing and chest congestion.  Not getting better with over-the-counter medications and Flonase.  He is trying to drink enough fluids.  He has done 2 COVID tests at home that were negative.  He states there is COVID in his office.  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 Authorizing Provider  azithromycin (ZITHROMAX Z-PAK) 250 MG tablet Take 2 pills right away.  After this take 1 pill a day until gone 08/16/22  Yes Eustace Moore, MD  chlorpheniramine-HYDROcodone (TUSSIONEX) 10-8 MG/5ML Take 5 mLs by mouth every 12 (twelve) hours as needed for cough. 08/16/22  Yes Eustace Moore, MD  predniSONE (DELTASONE) 50  MG tablet Take once a day for 5 days.  Take with food 08/16/22  Yes Eustace Moore, MD  albuterol (VENTOLIN HFA) 108 (90 Base) MCG/ACT inhaler Inhale 2 puffs into the lungs every 6 (six) hours as needed for wheezing or shortness of breath. 07/27/22   Everrett Coombe, DO  allopurinol (ZYLOPRIM) 300 MG tablet Take 1 tablet (300 mg total) by mouth daily. To prevent gout 07/03/22   Everrett Coombe, DO  amLODipine (NORVASC) 5 MG tablet Take 1 tablet (5 mg total) by mouth daily. 07/03/22   Everrett Coombe, DO  gabapentin (NEURONTIN) 300 MG capsule Take 1 capsule (300 mg total) by mouth 3 (three) times daily as needed. Patient not taking: Reported on 08/16/2022 07/03/22   Everrett Coombe, DO  metoprolol succinate (TOPROL-XL) 100 MG 24 hr tablet TAKE 1 TABLET(100 MG) BY MOUTH DAILY WITH OR IMMEDIATELY FOLLOWING A MEAL 07/03/22   Everrett Coombe, DO  omeprazole (PRILOSEC) 40 MG capsule TAKE 1 CAPSULE(40 MG) BY MOUTH DAILY 07/03/22   Everrett Coombe, DO    Family History Family History  Problem Relation Age of Onset   Cancer Mother    Hypertension Mother    Alcohol abuse Father     Social History Social History   Tobacco Use   Smoking status: Never   Smokeless tobacco: Never  Vaping Use   Vaping Use: Never used  Substance Use  Topics   Alcohol use: Yes    Comment: Rarely   Drug use: No     Allergies   Meloxicam and Lisinopril   Review of Systems Review of Systems  See HPI Physical Exam Triage Vital Signs ED Triage Vitals  Enc Vitals Group     BP 08/16/22 1531 (!) 138/90     Pulse Rate 08/16/22 1531 63     Resp 08/16/22 1531 18     Temp 08/16/22 1531 98.8 F (37.1 C)     Temp Source 08/16/22 1531 Oral     SpO2 08/16/22 1531 97 %     Weight 08/16/22 1534 205 lb (93 kg)     Height 08/16/22 1534 5\' 8"  (1.727 m)     Head Circumference --      Peak Flow --      Pain Score 08/16/22 1533 4     Pain Loc --      Pain Edu? --      Excl. in [place]? --    No data found.  Updated Vital Signs BP  (!) 138/90 (BP Location: Left Arm)   Pulse 63   Temp 98.8 F (37.1 C) (Oral)   Resp 18   Ht 5\' 8"  (1.727 m)   Wt 93 kg   SpO2 97%   BMI 31.17 kg/m      Physical Exam Constitutional:      General: He is not in acute distress.    Appearance: He is well-developed.     Comments: Hoarse voice.  Appears ill  HENT:     Head: Normocephalic and atraumatic.     Right Ear: Tympanic membrane and ear canal normal.     Left Ear: Tympanic membrane and ear canal normal.     Nose: Congestion and rhinorrhea present.     Mouth/Throat:     Pharynx: Posterior oropharyngeal erythema present.  Eyes:     Conjunctiva/sclera: Conjunctivae normal.     Pupils: Pupils are equal, round, and reactive to light.  Cardiovascular:     Rate and Rhythm: Normal rate and regular rhythm.     Heart sounds: Normal heart sounds.  Pulmonary:     Effort: Pulmonary effort is normal. No respiratory distress.     Breath sounds: Rhonchi present.  Abdominal:     General: There is no distension.     Palpations: Abdomen is soft.  Musculoskeletal:        General: Normal range of motion.     Cervical back: Normal range of motion.  Lymphadenopathy:     Cervical: Cervical adenopathy present.  Skin:    General: Skin is warm and dry.  Neurological:     Mental Status: He is alert.  Psychiatric:        Mood and Affect: Mood normal.        Behavior: Behavior normal.      UC Treatments / Results  Labs (all labs ordered are listed, but only abnormal results are displayed) Labs Reviewed - No data to display  EKG   Radiology No results found.  Procedures Procedures (including critical care time)  Medications Ordered in UC Medications - No data to display  Initial Impression / Assessment and Plan / UC Course  I have reviewed the triage vital signs and the nursing notes.  Pertinent labs & imaging results that were available during my care of the patient were reviewed by me and considered in my medical decision  making (see chart for details).  After 3 weeks of illness will treat with antibiotics.  Patient has written down the antibiotics that worked best for him last time, and I will repeat these.  Follow-up with PCP Final Clinical Impressions(s) / UC Diagnoses   Final diagnoses:  Recurrent sinus infections  Acute recurrent frontal sinusitis     Discharge Instructions      Drink lots of water Take the azithromycin antibiotic as prescribed.  You take 2 pills today then 1 a day until gone Take the prednisone once a day for 5 days I have refilled your cough medicine.  This may cause drowsiness.  Do not drive on this cough medicine See your doctor if not improving in a week or so   ED Prescriptions     Medication Sig Dispense Auth. Provider   predniSONE (DELTASONE) 50 MG tablet Take once a day for 5 days.  Take with food 5 tablet Eustace Moore, MD   azithromycin (ZITHROMAX Z-PAK) 250 MG tablet Take 2 pills right away.  After this take 1 pill a day until gone 6 tablet Eustace Moore, MD   chlorpheniramine-HYDROcodone (TUSSIONEX) 10-8 MG/5ML Take 5 mLs by mouth every 12 (twelve) hours as needed for cough. 115 mL Eustace Moore, MD      PDMP not reviewed this encounter.   Eustace Moore, MD 08/16/22 [phone]

## 2023-01-04 ENCOUNTER — Ambulatory Visit (INDEPENDENT_AMBULATORY_CARE_PROVIDER_SITE_OTHER): Payer: Commercial Managed Care - PPO | Admitting: Family Medicine

## 2023-01-04 ENCOUNTER — Encounter: Payer: Self-pay | Admitting: Family Medicine

## 2023-01-04 VITALS — BP 136/83 | HR 64 | Ht 68.0 in | Wt 211.0 lb

## 2023-01-04 DIAGNOSIS — I10 Essential (primary) hypertension: Secondary | ICD-10-CM | POA: Diagnosis not present

## 2023-01-04 DIAGNOSIS — M5416 Radiculopathy, lumbar region: Secondary | ICD-10-CM | POA: Diagnosis not present

## 2023-01-04 DIAGNOSIS — R079 Chest pain, unspecified: Secondary | ICD-10-CM | POA: Diagnosis not present

## 2023-01-04 MED ORDER — ALBUTEROL SULFATE HFA 108 (90 BASE) MCG/ACT IN AERS: 2.0000 | INHALATION_SPRAY | Freq: Four times a day (QID) | RESPIRATORY_TRACT | 3 refills | Status: DC | PRN

## 2023-01-04 MED ORDER — GABAPENTIN 300 MG PO CAPS: 300.0000 mg | ORAL_CAPSULE | Freq: Every day | ORAL | 3 refills | Status: DC

## 2023-01-04 NOTE — Assessment & Plan Note (Addendum)
He does have some night time cramping in the R leg.  Adding gabapentin back on. He will let me know if this is not effective.

## 2023-01-04 NOTE — Assessment & Plan Note (Signed)
BP is well controlled with current medications.  Recommend continuation of current medications for management of HTN.

## 2023-01-04 NOTE — Progress Notes (Signed)
Damon Conrad - 57 y.o. male MRN [id number]  Date of birth: [birth year]  Subjective Chief Complaint  Patient presents with   Follow-up    HPI Damon Conrad is a 57 y.o. male here today for follow up visit.   He continues on amlodipine and metoprolol for management of HTN.  BP has remained well controlled with current medications.   He has had cramping sensation in both legs, R>L.  This is worse at night.  No worsening with walking.  He has had a couple of episodes of chest pain.  Describes this as tightness radiating across his chest.  Denies sharp or pleuritic type pain.  Denies recent illness, dyspnea or palpitations. .   History of gout and taking allopurinol at current strength.    ROS:  A comprehensive ROS was completed and negative except as noted per HPI  Allergies  Allergen Reactions   Meloxicam     rash   Lisinopril Other (See Comments)    Patient reports "makes him feel off".     Past Medical History:  Diagnosis Date   GERD (gastroesophageal reflux disease)    Gout    HTN (hypertension), benign    Hyperlipidemia     Past Surgical History:  Procedure Laterality Date   ABDOMINAL HERNIA REPAIR Bilateral    x 2   HERNIA REPAIR      Social History   Socioeconomic History   Marital status: Single    Spouse name: Not on file   Number of children: Not on file   Years of education: Not on file   Highest education level: Not on file  Occupational History   Not on file  Tobacco Use   Smoking status: Never   Smokeless tobacco: Never  Vaping Use   Vaping Use: Never used  Substance and Sexual Activity   Alcohol use: Yes    Comment: Rarely   Drug use: No   Sexual activity: Yes    Partners: Female  Other Topics Concern   Not on file  Social History Narrative   Not on file   Social Determinants of Health   Financial Resource Strain: Not on file  Food Insecurity: Not on file  Transportation Needs: Not on file  Physical Activity: Not on file  Stress: Not on  file  Social Connections: Not on file    Family History  Problem Relation Age of Onset   Cancer Mother    Hypertension Mother    Alcohol abuse Father     Health Maintenance  Topic Date Due   DTaP/Tdap/Td (1 - Tdap) Never done   COVID-19 Vaccine (2 - 2023-24 season) 01/20/2023 (Originally 07/03/2022)   Zoster Vaccines- Shingrix (1 of 2) 04/06/2023 (Originally 06/20/2014)   Fecal DNA (Cologuard)  07/20/2025   Hepatitis C Screening  Completed   HIV Screening  Completed   HPV VACCINES  Aged Out   INFLUENZA VACCINE  Discontinued     ----------------------------------------------------------------------------------------------------------------------------------------------------------------------------------------------------------------- Physical Exam BP 136/83   Pulse 64   Ht '5\' 8"'$  (1.727 m)   Wt 211 lb (95.7 kg)   SpO2 99%   BMI 32.08 kg/m   Physical Exam Constitutional:      Appearance: Normal appearance.  HENT:     Head: Normocephalic and atraumatic.  Eyes:     General: No scleral icterus. Cardiovascular:     Rate and Rhythm: Normal rate and regular rhythm.  Pulmonary:     Effort: Pulmonary effort is normal.     Breath sounds: Normal  breath sounds.  Musculoskeletal:     Cervical back: Neck supple.  Neurological:     General: No focal deficit present.     Mental Status: He is alert.  Psychiatric:        Mood and Affect: Mood normal.        Behavior: Behavior normal.     ------------------------------------------------------------------------------------------------------------------------------------------------------------------------------------------------------------------- Assessment and Plan  Essential hypertension, benign BP is well controlled with current medications.  Recommend continuation of current medications for management of HTN.    Chest pain Concern for anginal symptoms given risk factors.  Referral placed to cardiology.  Red flags and  precautions discussed  Right lumbar radiculopathy He does have some night time cramping in the R leg.  Adding gabapentin back on. He will let me know if this is not effective.    Meds ordered this encounter  Medications   gabapentin (NEURONTIN) 300 MG capsule    Sig: Take 1 capsule (300 mg total) by mouth at bedtime.    Dispense:  90 capsule    Refill:  3    No follow-ups on file.    This visit occurred during the SARS-CoV-2 public health emergency.  Safety protocols were in place, including screening questions prior to the visit, additional usage of staff PPE, and extensive cleaning of exam room while observing appropriate contact time as indicated for disinfecting solutions.

## 2023-01-04 NOTE — Assessment & Plan Note (Signed)
Concern for anginal symptoms given risk factors.  Referral placed to cardiology.  Red flags and precautions discussed

## 2023-02-05 NOTE — Progress Notes (Signed)
Referring-Cody Jerolyn CenterMathews, DO Reason for referral-chest pain  HPI: 57 year old male for evaluation of chest pain at request of Milford CageCody Mathews, DO.  Patient seen previously but not since 2015.  Exercise treadmill August 2015 normal.  [facility] showed normal LV function and mild tricuspid regurgitation.  Patient states that he has had occasional chest pain most recent episode being 3 to 4 weeks ago.  He develops a sharp pain in his left shoulder/back and then a "pressure" in his chest.  It is substernal without radiation.  No associated symptoms.  Not pleuritic or positional.  Lasts approximately 5 to 6 minutes and resolve spontaneously.  He otherwise denies exertional chest pain.  No dyspnea on exertion, orthopnea, PND, pedal edema or syncope.  Cardiology now asked to evaluate.  Current Outpatient Medications  Medication Sig Dispense Refill   albuterol (VENTOLIN HFA) 108 (90 Base) MCG/ACT inhaler Inhale 2 puffs into the lungs every 6 (six) hours as needed for wheezing or shortness of breath. 8 g 3   gabapentin (NEURONTIN) 300 MG capsule Take 1 capsule (300 mg total) by mouth at bedtime. (Patient taking differently: Take 300 mg by mouth as needed (back pain).) 90 capsule 3   metoprolol succinate (TOPROL-XL) 100 MG 24 hr tablet TAKE 1 TABLET(100 MG) BY MOUTH DAILY WITH OR IMMEDIATELY FOLLOWING A MEAL 90 tablet 2   omeprazole (PRILOSEC) 40 MG capsule TAKE 1 CAPSULE(40 MG) BY MOUTH DAILY 90 capsule 2   predniSONE (DELTASONE) 50 MG tablet Take 25 mg by mouth as needed (for gout).     No current facility-administered medications for this visit.    Allergies  Allergen Reactions   Meloxicam     rash   Lisinopril Other (See Comments)    Patient reports "makes him feel off".      Past Medical History:  Diagnosis Date   GERD (gastroesophageal reflux disease)    Gout    HTN (hypertension), benign    Hyperlipidemia     Past Surgical History:  Procedure Laterality  Date   ABDOMINAL HERNIA REPAIR Bilateral    x 2   HERNIA REPAIR      Social History   Socioeconomic History   Marital status: Married    Spouse name: Not on file   Number of children: Not on file   Years of education: Not on file   Highest education level: Not on file  Occupational History   Not on file  Tobacco Use   Smoking status: Never   Smokeless tobacco: Never  Vaping Use   Vaping Use: Never used  Substance and Sexual Activity   Alcohol use: Yes    Comment: Rarely   Drug use: No   Sexual activity: Yes    Partners: Female  Other Topics Concern   Not on file  Social History Narrative   Not on file   Social Determinants of Health   Financial Resource Strain: Not on file  Food Insecurity: Not on file  Transportation Needs: Not on file  Physical Activity: Not on file  Stress: Not on file  Social Connections: Not on file  Intimate Partner Violence: Not on file    Family History  Problem Relation Age of Onset   Cancer Mother    Hypertension Mother    Alcohol abuse Father     ROS: no fevers or chills, productive cough, hemoptysis, dysphasia, odynophagia, melena, hematochezia, dysuria, hematuria, rash, seizure activity, orthopnea, PND, pedal edema, claudication. Remaining systems are negative.  Physical Exam:   Blood pressure (!) 142/92, pulse (!) 56, height 5\' 7"  (1.702 m), weight 218 lb 1.3 oz (98.9 kg), SpO2 94 %.  General:  Well developed/well nourished in NAD Skin warm/dry Patient not depressed No peripheral clubbing Back-normal HEENT-normal/normal eyelids Neck supple/normal carotid upstroke bilaterally; no bruits; no JVD; no thyromegaly chest - CTA/ normal expansion CV - RRR/normal S1 and S2; no murmurs, rubs or gallops;  PMI nondisplaced Abdomen -NT/ND, no HSM, no mass, + bowel sounds, no bruit 2+ femoral pulses, no bruits Ext-no edema, chords, 2+ DP Neuro-grossly nonfocal  ECG -sinus bradycardia at a rate of 56, no significant ST changes.   Personally reviewed  A/P  1 chest pain-symptoms with both typical and atypical features.  Electrocardiogram without diagnostic ST changes.  I will arrange a cardiac CTA to rule out obstructive coronary disease.  2 hypertension-pressure elevated.  I have asked him to follow this at home and we will add additional medications as needed.  3 hyperlipidemia-most recent lipid panel July 03, 2022 showed total cholesterol 157, HDL 29, triglycerides 279, LDL 91.  If he has coronary disease he will require statin therapy.  Olga MillersBrian Aniston Christman, MD

## 2023-02-15 ENCOUNTER — Ambulatory Visit: Payer: Commercial Managed Care - PPO | Admitting: Cardiology

## 2023-02-15 ENCOUNTER — Encounter: Payer: Self-pay | Admitting: Cardiology

## 2023-02-15 VITALS — BP 142/92 | HR 56 | Ht 67.0 in | Wt 218.1 lb

## 2023-02-15 DIAGNOSIS — E7800 Pure hypercholesterolemia, unspecified: Secondary | ICD-10-CM | POA: Diagnosis not present

## 2023-02-15 DIAGNOSIS — I10 Essential (primary) hypertension: Secondary | ICD-10-CM

## 2023-02-15 DIAGNOSIS — R072 Precordial pain: Secondary | ICD-10-CM

## 2023-02-15 MED ORDER — METOPROLOL TARTRATE 25 MG PO TABS: ORAL_TABLET | ORAL | 0 refills | Status: DC

## 2023-02-15 NOTE — Patient Instructions (Signed)
  Testing/Procedures:    Your cardiac CT will be scheduled at one of the below locations:   [facility] [address] [place], [place] [address] [phone]    If scheduled at [facility], please arrive at the [facility] and Children's Entrance (Entrance C2) of [facility] 30 minutes prior to test start time. You can use the FREE valet parking offered at entrance C (encouraged to control the heart rate for the test)  Proceed to the [facility] Radiology Department (first floor) to check-in and test prep.  All radiology patients and guests should use entrance C2 at [facility], accessed from [facility], even though the hospital's physical address listed is [address].     Please follow these instructions carefully (unless otherwise directed):  Hold all erectile dysfunction medications at least 3 days (72 hrs) prior to test. (Ie viagra, cialis, sildenafil, tadalafil, etc) We will administer nitroglycerin during this exam.   On the Night Before the Test: Be sure to Drink plenty of water. Do not consume any caffeinated/decaffeinated beverages or chocolate 12 hours prior to your test. Do not take any antihistamines 12 hours prior to your test.   On the Day of the Test: Drink plenty of water until 1 hour prior to the test. Do not eat any food 1 hour prior to test. You may take your regular medications prior to the test.  Take metoprolol (Lopressor) 25 mg two hours prior to test.       After the Test: Drink plenty of water. After receiving IV contrast, you may experience a mild flushed feeling. This is normal. On occasion, you may experience a mild rash up to 24 hours after the test. This is not dangerous. If this occurs, you can take Benadryl 25 mg and increase your fluid intake. If you experience trouble breathing, this can be serious. If it is severe call 911 IMMEDIATELY. If it is mild, please call our office.  We  will call to schedule your test 2-4 weeks out understanding that some insurance companies will need an authorization prior to the service being performed.   For non-scheduling related questions, please contact the cardiac imaging nurse navigator should you have any questions/concerns: [name] Alexandria, Cardiac Imaging Nurse Navigator Larey Brick, Cardiac Imaging Nurse Navigator [facility] Direct Office Dial: [phone]   For scheduling needs, including cancellations and rescheduling, please call [place], [phone].    Follow-Up: At [facility], you and your health needs are our priority.  As part of our continuing mission to provide you with exceptional heart care, we have created designated Provider Care Teams.  These Care Teams include your primary Cardiologist (physician) and Advanced Practice Providers (APPs -  Physician Assistants and Nurse Practitioners) who all work together to provide you with the care you need, when you need it.  We recommend signing up for the patient portal called "MyChart".  Sign up information is provided on this After Visit Summary.  MyChart is used to connect with patients for Virtual Visits (Telemedicine).  Patients are able to view lab/test results, encounter notes, upcoming appointments, etc.  Non-urgent messages can be sent to your provider as well.   To learn more about what you can do with MyChart, go to [email or web address].    Your next appointment:   12 month(s)  Provider:   Olga Millers MD

## 2023-02-18 ENCOUNTER — Other Ambulatory Visit (HOSPITAL_COMMUNITY): Payer: Commercial Managed Care - PPO

## 2023-02-19 ENCOUNTER — Telehealth (HOSPITAL_COMMUNITY): Payer: Self-pay | Admitting: *Deleted

## 2023-02-19 NOTE — Telephone Encounter (Signed)
Attempted to call patient regarding upcoming cardiac CT appointment. °Left message on voicemail with name and callback number ° °Lizabeth Fellner RN Navigator Cardiac Imaging °[facility] °[phone] Office °[phone] Cell ° °

## 2023-02-22 ENCOUNTER — Ambulatory Visit (HOSPITAL_COMMUNITY)
Admission: RE | Admit: 2023-02-22 | Discharge: 2023-02-22 | Disposition: A | Discharge status: Home / Self Care | Payer: Commercial Managed Care - PPO | Source: Ambulatory Visit | Attending: Cardiology | Admitting: Cardiology

## 2023-02-22 DIAGNOSIS — R072 Precordial pain: Secondary | ICD-10-CM | POA: Insufficient documentation

## 2023-02-22 DIAGNOSIS — I2510 Atherosclerotic heart disease of native coronary artery without angina pectoris: Secondary | ICD-10-CM

## 2023-02-22 MED ORDER — IOHEXOL 350 MG/ML SOLN
95.0000 mL | Freq: Once | INTRAVENOUS | Status: AC | PRN
Start: 2023-02-22 — End: 2023-02-22
  Administered 2023-02-22: 95 mL via INTRAVENOUS

## 2023-02-22 MED ORDER — NITROGLYCERIN 0.4 MG SL SUBL
SUBLINGUAL_TABLET | SUBLINGUAL | Status: AC
  Filled 2023-02-22: qty 2

## 2023-02-22 MED ORDER — NITROGLYCERIN 0.4 MG SL SUBL
0.8000 mg | SUBLINGUAL_TABLET | Freq: Once | SUBLINGUAL | Status: AC
  Administered 2023-02-22: 0.8 mg via SUBLINGUAL

## 2023-02-23 ENCOUNTER — Telehealth: Payer: Self-pay | Admitting: *Deleted

## 2023-02-23 DIAGNOSIS — I2510 Atherosclerotic heart disease of native coronary artery without angina pectoris: Secondary | ICD-10-CM

## 2023-02-23 DIAGNOSIS — E7800 Pure hypercholesterolemia, unspecified: Secondary | ICD-10-CM

## 2023-02-23 MED ORDER — ROSUVASTATIN CALCIUM 40 MG PO TABS
40.0000 mg | ORAL_TABLET | Freq: Every day | ORAL | 3 refills | Status: DC
Start: 2023-02-23 — End: 2024-03-03

## 2023-02-23 MED ORDER — ASPIRIN 81 MG PO TBEC
81.0000 mg | DELAYED_RELEASE_TABLET | Freq: Every day | ORAL | 3 refills | Status: DC
Start: 2023-02-23 — End: 2023-06-20

## 2023-02-23 NOTE — Telephone Encounter (Signed)
-----   Message from Brian S Crenshaw, MD sent at 02/23/2023  7:17 AM EDT ----- Plan medical therapy; add asa 81 mg daily; add crestor 40 mg daily; lipids and liver 8 weeks Brian Crenshaw  

## 2023-02-23 NOTE — Telephone Encounter (Signed)
Left message for pt to call.

## 2023-02-23 NOTE — Telephone Encounter (Signed)
-----   Message from Lewayne Bunting, MD sent at 02/23/2023  7:17 AM EDT ----- Plan medical therapy; add asa 81 mg daily; add crestor 40 mg daily; lipids and liver 8 weeks Olga Millers

## 2023-02-23 NOTE — Telephone Encounter (Signed)
pt aware of results  New script sent to the pharmacy  Lab orders mailed to the pt  

## 2023-04-11 ENCOUNTER — Other Ambulatory Visit: Payer: Self-pay | Admitting: Family Medicine

## 2023-04-11 DIAGNOSIS — K2100 Gastro-esophageal reflux disease with esophagitis, without bleeding: Secondary | ICD-10-CM

## 2023-04-12 ENCOUNTER — Other Ambulatory Visit: Payer: Self-pay

## 2023-04-12 DIAGNOSIS — I10 Essential (primary) hypertension: Secondary | ICD-10-CM

## 2023-04-12 MED ORDER — METOPROLOL SUCCINATE ER 100 MG PO TB24
ORAL_TABLET | ORAL | 0 refills | Status: DC
Start: 2023-04-12 — End: 2023-07-08

## 2023-05-04 ENCOUNTER — Encounter: Payer: Self-pay | Admitting: *Deleted

## 2023-05-05 LAB — LIPID PANEL
Chol/HDL Ratio: 3.6 ratio (ref 0.0–5.0)
Cholesterol, Total: 123 mg/dL (ref 100–199)
HDL: 34 mg/dL — ABNORMAL LOW (ref >39)
LDL Chol Calc (NIH): 64 mg/dL (ref 0–99)
Triglycerides: 142 mg/dL (ref 0–149)
VLDL Cholesterol Cal: 25 mg/dL (ref 5–40)

## 2023-05-05 LAB — HEPATIC FUNCTION PANEL
ALT: 21 IU/L (ref 0–44)
AST: 22 IU/L (ref 0–40)
Albumin: 3.9 g/dL (ref 3.8–4.9)
Alkaline Phosphatase: 91 IU/L (ref 44–121)
Bilirubin Total: 0.6 mg/dL (ref 0.0–1.2)
Bilirubin, Direct: 0.19 mg/dL (ref 0.00–0.40)
Total Protein: 6.8 g/dL (ref 6.0–8.5)

## 2023-05-07 ENCOUNTER — Telehealth: Payer: Self-pay | Admitting: Family Medicine

## 2023-05-07 NOTE — Telephone Encounter (Signed)
Patient called.  Requesting refill on his Prednisone for gout flare up. Pharmacy: [facility]

## 2023-05-10 ENCOUNTER — Other Ambulatory Visit: Payer: Self-pay | Admitting: Family Medicine

## 2023-05-10 MED ORDER — PREDNISONE 50 MG PO TABS: ORAL_TABLET | ORAL | 0 refills | Status: DC

## 2023-06-20 ENCOUNTER — Ambulatory Visit
Admission: EM | Admit: 2023-06-20 | Discharge: 2023-06-20 | Disposition: A | Discharge status: Home / Self Care | Payer: Commercial Managed Care - PPO | Attending: Physician Assistant | Admitting: Physician Assistant

## 2023-06-20 ENCOUNTER — Other Ambulatory Visit: Payer: Self-pay

## 2023-06-20 DIAGNOSIS — U071 COVID-19: Secondary | ICD-10-CM

## 2023-06-20 DIAGNOSIS — E7800 Pure hypercholesterolemia, unspecified: Secondary | ICD-10-CM

## 2023-06-20 DIAGNOSIS — R051 Acute cough: Secondary | ICD-10-CM

## 2023-06-20 MED ORDER — MOLNUPIRAVIR EUA 200MG CAPSULE: 4.0000 | ORAL_CAPSULE | Freq: Two times a day (BID) | ORAL | 0 refills | Status: AC

## 2023-06-20 MED ORDER — PREDNISONE 20 MG PO TABS: 40.0000 mg | ORAL_TABLET | Freq: Every day | ORAL | 0 refills | Status: AC

## 2023-06-20 MED ORDER — FLUTICASONE PROPIONATE 50 MCG/ACT NA SUSP: 1.0000 | Freq: Every day | NASAL | 0 refills | Status: DC

## 2023-06-20 MED ORDER — HYDROCODONE BIT-HOMATROP MBR 5-1.5 MG/5ML PO SOLN: 5.0000 mL | Freq: Three times a day (TID) | ORAL | 0 refills | Status: AC | PRN

## 2023-06-20 NOTE — Discharge Instructions (Signed)
Given you had an at-home COVID test I believe this is the cause of your symptoms.  This is a virus and does not respond to antibiotics.  We are treating it with a medication that helps your body fight the virus known as molnupiravir.  Take this twice daily for 5 days.  We also need to manage her symptoms or have called in Flonase to help with your congestion as well as Hycodan cough syrup.  This has hydrocodone and it and is both addictive and sedating.  Do not drive or drink alcohol taking it.  We cannot provide any refills of this medicine.  You can use over-the-counter medication including Mucinex, Tylenol for additional symptom relief.  I have also called in a short course of prednisone for a few days to help with your symptoms.  Do not take NSAIDs with this medication which includes ibuprofen/Advil/Motrin, naproxen/Aleve, aspirin.  If your symptoms are not improving within a few days or if anything worsens you need to be seen immediately.

## 2023-06-20 NOTE — ED Provider Notes (Signed)
[facility]    CSN: [id number] Arrival date & time: 06/20/23  1328      History   Chief Complaint No chief complaint on file.   HPI Dam[facility] Conrad is a 57 y.o. male.   Patient presents today with a 3 to 4-day history of URI symptoms.  He reports congestion, cough, body aches, fever, chills.  He denies any chest pain, shortness of breath, nausea, vomiting.  Denies any known sick contacts but does have several people in his social circle who have tested positive for COVID.  He has had COVID several times with his last episode in 2022.  He has had [employer] COVID-vaccine but not had most recent boosters.  He has tried Tylenol as well as over-the-counter medication for symptom management.  Reports that he often gets sick annually and typically requires azithromycin as well as hydrocodone-based cough syrup for resolution of symptoms.  He is requesting a refill of these medications if appropriate today.  Denies any recent antibiotics or steroids.  He does not smoke and denies any history of chronic lung condition including asthma, COPD, allergies.  He does have hypertension and hyperlipidemia.    Past Medical History:  Diagnosis Date   GERD (gastroesophageal reflux disease)    Gout    HTN (hypertension), benign    Hyperlipidemia     Patient Active Problem List   Diagnosis Date Noted   Pain of left calf 12/13/2020   Chest pain 07/14/2020   COVID-19 virus infection 06/24/2020   Hyponatremia 06/14/2020   Elevated liver enzymes 06/08/2020   BMI 31.0-31.9,adult 12/16/2017   Haglund's deformity of right heel 09/15/2016   Chronic gout 02/21/2016   Right lumbar radiculopathy 02/25/2015   HLD (hyperlipidemia) 05/08/2014   Essential hypertension, benign 05/07/2014   GERD (gastroesophageal reflux disease) 05/07/2014    Past Surgical History:  Procedure Laterality Date   ABDOMINAL HERNIA REPAIR Bilateral    x 2   HERNIA REPAIR         Home Medications    Prior  to Admission medications   Medication Sig Start Date End Date Taking? Authorizing Provider  fluticasone (FLONASE) 50 MCG/ACT nasal spray Place 1 spray into both nostrils daily. 06/20/23  Yes Trayshawn Durkin K, PA-C  HYDROcodone bit-homatropine (HYCODAN) 5-1.5 MG/5ML syrup Take 5 mLs by mouth every 8 (eight) hours as needed for up to 5 days for cough. 06/20/23 06/25/23 Yes Karman Biswell K, PA-C  molnupiravir EUA (LAGEVRIO) 200 mg CAPS capsule Take 4 capsules (800 mg total) by mouth 2 (two) times daily for 5 days. 06/20/23 06/25/23 Yes Zebastian Carico, Denny Peon K, PA-C  predniSONE (DELTASONE) 20 MG tablet Take 2 tablets (40 mg total) by mouth daily for 4 days. 06/20/23 06/24/23 Yes Amos Gaber, Denny Peon K, PA-C  metoprolol succinate (TOPROL-XL) 100 MG 24 hr tablet TAKE 1 TABLET(100 MG) BY MOUTH DAILY WITH OR IMMEDIATELY FOLLOWING A MEAL 04/12/23   Everrett Coombe, DO  omeprazole (PRILOSEC) 40 MG capsule TAKE 1 CAPSULE(40 MG) BY MOUTH DAILY 04/12/23   Everrett Coombe, DO  rosuvastatin (CRESTOR) 40 MG tablet Take 1 tablet (40 mg total) by mouth daily. 02/23/23   Lewayne Bunting, MD    Family History Family History  Problem Relation Age of Onset   Cancer Mother    Hypertension Mother    Alcohol abuse Father     Social History Social History   Tobacco Use   Smoking status: Never   Smokeless tobacco: Never  Vaping Use   Vaping status:  Never Used  Substance Use Topics   Alcohol use: Yes    Comment: Rarely   Drug use: No     Allergies   Meloxicam and Lisinopril   Review of Systems Review of Systems  Constitutional:  Positive for activity change, fatigue and fever. Negative for appetite change.  HENT:  Positive for congestion and sore throat. Negative for sinus pressure and sneezing.   Respiratory:  Positive for cough. Negative for shortness of breath.   Cardiovascular:  Negative for chest pain.  Gastrointestinal:  Negative for abdominal pain, diarrhea, nausea and vomiting.  Musculoskeletal:  Negative for arthralgias  and myalgias.  Neurological:  Negative for dizziness, light-headedness and headaches.     Physical Exam Triage Vital Signs ED Triage Vitals  Encounter Vitals Group     BP 06/20/23 1334 128/75     Systolic BP Percentile --      Diastolic BP Percentile --      Pulse Rate 06/20/23 1334 66     Resp 06/20/23 1334 16     Temp 06/20/23 1334 98.1 F (36.7 C)     Temp Source 06/20/23 1334 Oral     SpO2 06/20/23 1334 98 %     Weight --      Height --      Head Circumference --      Peak Flow --      Pain Score 06/20/23 1335 5     Pain Loc --      Pain Education --      Exclude from Growth Chart --    No data found.  Updated Vital Signs BP 128/75 (BP Location: Right Arm)   Pulse 66   Temp 98.1 F (36.7 C) (Oral)   Resp 16   SpO2 98%   Visual Acuity Right Eye Distance:   Left Eye Distance:   Bilateral Distance:    Right Eye Near:   Left Eye Near:    Bilateral Near:     Physical Exam Vitals reviewed.  Constitutional:      General: He is awake.     Appearance: Normal appearance. He is well-developed. He is not ill-appearing.     Comments: Very pleasant male appears stated age in no acute distress sitting comfortably in exam room  HENT:     Head: Normocephalic and atraumatic.     Right Ear: Tympanic membrane, ear canal and external ear normal. Tympanic membrane is not erythematous or bulging.     Left Ear: Tympanic membrane, ear canal and external ear normal. Tympanic membrane is not erythematous or bulging.     Nose: Nose normal.     Mouth/Throat:     Pharynx: Uvula midline. Posterior oropharyngeal erythema present. No oropharyngeal exudate or uvula swelling.  Cardiovascular:     Rate and Rhythm: Normal rate and regular rhythm.     Heart sounds: Normal heart sounds, S1 normal and S2 normal. No murmur heard. Pulmonary:     Effort: Pulmonary effort is normal. No accessory muscle usage or respiratory distress.     Breath sounds: Normal breath sounds. No stridor. No  wheezing, rhonchi or rales.     Comments: Clear to auscultation bilaterally Neurological:     Mental Status: He is alert.  Psychiatric:        Behavior: Behavior is cooperative.      UC Treatments / Results  Labs (all labs ordered are listed, but only abnormal results are displayed) Labs Reviewed - No data to display  EKG  Radiology No results found.  Procedures Procedures (including critical care time)  Medications Ordered in UC Medications - No data to display  Initial Impression / Assessment and Plan / UC Course  I have reviewed the triage vital signs and the nursing notes.  Pertinent labs & imaging results that were available during my care of the patient were reviewed by me and considered in my medical decision making (see chart for details).     Patient is well-appearing, afebrile, nontoxic, nontachycardic.  He had a positive at-home COVID test so this was not repeated in our clinic and we discussed that this is the likely cause of his symptoms.  Chest x-ray was deferred as his oxygen saturation was 98% and he had no adventitious lung sounds on exam.  No evidence of acute infection on physical exam that would warrant initiation of antibiotics.  Patient did request a refill of azithromycin we discussed that this is not appropriate given his clinical presentation at this time.  He requested a refill of Hycodan.  Discussed that I do not typically prescribe this but he does report that he has tried multiple other cough medicines that have been ineffective and so 5-day course of this was sent to pharmacy.  Review of [place] controlled substance database shows no appropriate refills.  We did discuss that this is sedating and addictive so he should limit use is much as possible.  We did consider Paxlovid but we do not have a recent metabolic panel on file and by the time we had this drawn and resulted to be outside the window effectiveness for antiviral therapy.  Instead  started molnupiravir twice daily as this does not require kidney monitoring and he would not have to adjust his medications.  Prednisone burst results sent to pharmacy and he was instructed not to take NSAIDs with this medication.  Recommended he use over-the-counter medications including fluticasone, Mucinex, Tylenol.  He is to rest and drink plenty of fluid.  Recommend he follow-up with his primary care if symptoms are not resolved within a few days.  If anything worsens he needs to be seen immediately.  Strict return precautions given.  Work excuse note provided.  Final Clinical Impressions(s) / UC Diagnoses   Final diagnoses:  COVID-19  Acute cough     Discharge Instructions      Given you had an at-home COVID test I believe this is the cause of your symptoms.  This is a virus and does not respond to antibiotics.  We are treating it with a medication that helps your body fight the virus known as molnupiravir.  Take this twice daily for 5 days.  We also need to manage her symptoms or have called in Flonase to help with your congestion as well as Hycodan cough syrup.  This has hydrocodone and it and is both addictive and sedating.  Do not drive or drink alcohol taking it.  We cannot provide any refills of this medicine.  You can use over-the-counter medication including Mucinex, Tylenol for additional symptom relief.  I have also called in a short course of prednisone for a few days to help with your symptoms.  Do not take NSAIDs with this medication which includes ibuprofen/Advil/Motrin, naproxen/Aleve, aspirin.  If your symptoms are not improving within a few days or if anything worsens you need to be seen immediately.     ED Prescriptions     Medication Sig Dispense Auth. Provider   fluticasone (FLONASE) 50 MCG/ACT nasal spray Place  1 spray into both nostrils daily. 16 g Aldan Camey K, PA-C   HYDROcodone bit-homatropine (HYCODAN) 5-1.5 MG/5ML syrup Take 5 mLs by mouth every 8 (eight)  hours as needed for up to 5 days for cough. 75 mL Lunell Robart K, PA-C   molnupiravir EUA (LAGEVRIO) 200 mg CAPS capsule Take 4 capsules (800 mg total) by mouth 2 (two) times daily for 5 days. 40 capsule Aziyah Provencal K, PA-C   predniSONE (DELTASONE) 20 MG tablet Take 2 tablets (40 mg total) by mouth daily for 4 days. 8 tablet Meg Niemeier, Noberto Retort, PA-C      PDMP not reviewed this encounter.   Jeani Hawking, PA-C 06/20/23 1414

## 2023-06-20 NOTE — ED Triage Notes (Addendum)
Headaches, cough, body aches, sore throat x 4 days. Home test positive for covid.

## 2023-06-21 ENCOUNTER — Telehealth: Payer: Self-pay | Admitting: Family Medicine

## 2023-06-21 NOTE — Telephone Encounter (Signed)
Patient called stating he was seen at Urgent Care for Covid. Patient was prescribed molnupiravir EUA (LAGEVRIO) 200 mg CAPS capsule [[id number]] but would like an alternative due to a possible side affect that could hinder his breathing.   Pharmacy -  [facility] [phone] - [place], [place] - [address]

## 2023-06-22 NOTE — Telephone Encounter (Signed)
No alternative as he does not have a recent renal function on file.

## 2023-06-24 ENCOUNTER — Telehealth: Payer: Self-pay | Admitting: Family Medicine

## 2023-06-24 NOTE — Telephone Encounter (Signed)
Attempted call to patient. Left a voice mail message requesting a return call.  

## 2023-06-24 NOTE — Telephone Encounter (Signed)
This is still residual from COVID.  Recommend delsym for cough.  May use afrin for up to 3 days for congestion. Mucinex with increased fluids may be helpful.

## 2023-06-24 NOTE — Telephone Encounter (Signed)
Discussed message by Dr. Ashley Royalty.

## 2023-06-24 NOTE — Telephone Encounter (Signed)
Left a voice mail message requesting a return call. ==kph

## 2023-06-24 NOTE — Telephone Encounter (Signed)
Patient returned call he is asking if he can get azithromycin 250mg  for a cold please advise

## 2023-06-25 NOTE — Telephone Encounter (Signed)
Patient informed. 

## 2023-06-29 ENCOUNTER — Telehealth: Payer: Self-pay | Admitting: Family Medicine

## 2023-06-29 NOTE — Telephone Encounter (Signed)
Patient called requesting cough pill for Covid ? Please advise  [facility]  [phone]

## 2023-07-04 ENCOUNTER — Telehealth: Payer: Self-pay

## 2023-07-04 MED ORDER — BENZONATATE 200 MG PO CAPS: 200.0000 mg | ORAL_CAPSULE | Freq: Three times a day (TID) | ORAL | 0 refills | Status: DC | PRN

## 2023-07-04 NOTE — Telephone Encounter (Signed)
approved

## 2023-07-04 NOTE — Telephone Encounter (Signed)
Pt requesting rx for tesslon perles

## 2023-07-08 ENCOUNTER — Other Ambulatory Visit: Payer: Self-pay | Admitting: Family Medicine

## 2023-07-08 DIAGNOSIS — I10 Essential (primary) hypertension: Secondary | ICD-10-CM

## 2023-07-12 ENCOUNTER — Ambulatory Visit: Payer: Commercial Managed Care - PPO | Admitting: Family Medicine

## 2023-07-27 ENCOUNTER — Ambulatory Visit: Payer: Commercial Managed Care - PPO | Admitting: Family Medicine

## 2023-08-17 ENCOUNTER — Ambulatory Visit: Payer: Commercial Managed Care - PPO | Admitting: Family Medicine

## 2023-08-23 ENCOUNTER — Encounter: Payer: Self-pay | Admitting: Family Medicine

## 2023-08-23 ENCOUNTER — Ambulatory Visit (INDEPENDENT_AMBULATORY_CARE_PROVIDER_SITE_OTHER): Payer: Commercial Managed Care - PPO | Admitting: Family Medicine

## 2023-08-23 VITALS — BP 135/80 | HR 52 | Ht 67.0 in | Wt 223.0 lb

## 2023-08-23 DIAGNOSIS — J0100 Acute maxillary sinusitis, unspecified: Secondary | ICD-10-CM | POA: Insufficient documentation

## 2023-08-23 DIAGNOSIS — I10 Essential (primary) hypertension: Secondary | ICD-10-CM | POA: Diagnosis not present

## 2023-08-23 DIAGNOSIS — Z125 Encounter for screening for malignant neoplasm of prostate: Secondary | ICD-10-CM

## 2023-08-23 MED ORDER — PREDNISONE 50 MG PO TABS: ORAL_TABLET | ORAL | 0 refills | Status: DC

## 2023-08-23 MED ORDER — DOXYCYCLINE HYCLATE 100 MG PO TABS: 100.0000 mg | ORAL_TABLET | Freq: Two times a day (BID) | ORAL | 0 refills | Status: DC

## 2023-08-23 NOTE — Progress Notes (Signed)
Damon Conrad - 57 y.o. male MRN [id number]  Date of birth: 03/27/1966  Subjective Chief Complaint  Patient presents with   Hypertension   URI    HPI Damon Conrad is a 57 y.o. male here today for follow up visit.   Continues on metoprolol for management of hypertension.  Blood pressure is well-controlled at this time.  Denies chest pain, shortness of breath, palpitations, headaches or vision changes.  He has had sinus pain and pressure for about 2 weeks.  Mucus is thick and yellow.  Denies fever or chills at this time.  Has not tried anything at home so far.    ROS:  A comprehensive ROS was completed and negative except as noted per HPI  Allergies  Allergen Reactions   Meloxicam     rash   Lisinopril Other (See Comments)    Patient reports "makes him feel off".     Past Medical History:  Diagnosis Date   GERD (gastroesophageal reflux disease)    Gout    HTN (hypertension), benign    Hyperlipidemia     Past Surgical History:  Procedure Laterality Date   ABDOMINAL HERNIA REPAIR Bilateral    x 2   HERNIA REPAIR      Social History   Socioeconomic History   Marital status: Married    Spouse name: Not on file   Number of children: Not on file   Years of education: Not on file   Highest education level: Not on file  Occupational History   Not on file  Tobacco Use   Smoking status: Never   Smokeless tobacco: Never  Vaping Use   Vaping status: Never Used  Substance and Sexual Activity   Alcohol use: Yes    Comment: Rarely   Drug use: No   Sexual activity: Yes    Partners: Female  Other Topics Concern   Not on file  Social History Narrative   Not on file   Social Determinants of Health   Financial Resource Strain: Low Risk  (06/16/2020)   [facility], [facility]   Overall Financial Resource Strain (CARDIA)    Difficulty of Paying Living Expenses: Not very hard  Food Insecurity: Not on file  Transportation Needs: Not on file  Physical  Activity: Not on file  Stress: No Stress Concern Present (06/16/2020)   Received from [facility], [facility]   [employer] of Occupational Health - Occupational Stress Questionnaire    Feeling of Stress : Only a little  Social Connections: Unknown (03/13/2022)   Received from [facility], [facility]   Social Network    Social Network: Not on file    Family History  Problem Relation Age of Onset   Cancer Mother    Hypertension Mother    Alcohol abuse Father     Health Maintenance  Topic Date Due   DTaP/Tdap/Td (1 - Tdap) Never done   Zoster Vaccines- Shingrix (1 of 2) Never done   COVID-19 Vaccine (2 - 2023-24 season) 07/04/2023   Fecal DNA (Cologuard)  07/20/2025   Hepatitis C Screening  Completed   HIV Screening  Completed   HPV VACCINES  Aged Out   INFLUENZA VACCINE  Discontinued     ----------------------------------------------------------------------------------------------------------------------------------------------------------------------------------------------------------------- Physical Exam BP 135/80 (BP Location: Left Arm, Patient Position: Sitting, Cuff Size: Normal)   Pulse (!) 52   Ht 5\' 7"  (1.702 m)   Wt 223 lb (101.2 kg)   SpO2 96%   BMI 34.93 kg/m  Physical Exam Constitutional:      Appearance: Normal appearance.  Eyes:     General: No scleral icterus. Cardiovascular:     Rate and Rhythm: Normal rate and regular rhythm.  Pulmonary:     Effort: Pulmonary effort is normal.     Breath sounds: Normal breath sounds.  Musculoskeletal:     Cervical back: Neck supple.  Neurological:     Mental Status: He is alert.  Psychiatric:        Mood and Affect: Mood normal.        Behavior: Behavior normal.     ------------------------------------------------------------------------------------------------------------------------------------------------------------------------------------------------------------------- Assessment and  Plan  Essential hypertension, benign Blood pressure is well-controlled.  Recommend continuation of metoprolol at current strength.  Acute maxillary sinusitis Prescription for course of doxycycline as well as prednisone sent in.  Recommend supportive care at home with increase fluid intake and relative rest.   Meds ordered this encounter  Medications   doxycycline (VIBRA-TABS) 100 MG tablet    Sig: Take 1 tablet (100 mg total) by mouth 2 (two) times daily.    Dispense:  20 tablet    Refill:  0   predniSONE (DELTASONE) 50 MG tablet    Sig: Take 1 tab po daily x5 days.    Dispense:  5 tablet    Refill:  0    Return in about 6 months (around 02/21/2024) for Hypertension.    This visit occurred during the SARS-CoV-2 public health emergency.  Safety protocols were in place, including screening questions prior to the visit, additional usage of staff PPE, and extensive cleaning of exam room while observing appropriate contact time as indicated for disinfecting solutions.

## 2023-08-23 NOTE — Assessment & Plan Note (Signed)
Blood pressure is well-controlled.  Recommend continuation of metoprolol at current strength.

## 2023-08-23 NOTE — Assessment & Plan Note (Signed)
Prescription for course of doxycycline as well as prednisone sent in.  Recommend supportive care at home with increase fluid intake and relative rest.

## 2023-08-23 NOTE — Patient Instructions (Signed)

## 2023-08-24 LAB — CBC WITH DIFFERENTIAL/PLATELET
Basophils Absolute: 0 10*3/uL (ref 0.0–0.2)
Basos: 0 %
EOS (ABSOLUTE): 0.3 10*3/uL (ref 0.0–0.4)
Eos: 4 %
Hematocrit: 44.7 % (ref 37.5–51.0)
Hemoglobin: 14.3 g/dL (ref 13.0–17.7)
Immature Grans (Abs): 0 10*3/uL (ref 0.0–0.1)
Immature Granulocytes: 0 %
Lymphocytes Absolute: 2.3 10*3/uL (ref 0.7–3.1)
Lymphs: 34 %
MCH: 29.5 pg (ref 26.6–33.0)
MCHC: 32 g/dL (ref 31.5–35.7)
MCV: 92 fL (ref 79–97)
Monocytes Absolute: 0.5 10*3/uL (ref 0.1–0.9)
Monocytes: 7 %
Neutrophils Absolute: 3.8 10*3/uL (ref 1.4–7.0)
Neutrophils: 55 %
Platelets: 154 10*3/uL (ref 150–450)
RBC: 4.85 x10E6/uL (ref 4.14–5.80)
RDW: 13.1 % (ref 11.6–15.4)
WBC: 6.9 10*3/uL (ref 3.4–10.8)

## 2023-08-24 LAB — CMP14+EGFR
ALT: 19 [IU]/L (ref 0–44)
AST: 20 [IU]/L (ref 0–40)
Albumin: 3.8 g/dL (ref 3.8–4.9)
Alkaline Phosphatase: 79 [IU]/L (ref 44–121)
BUN/Creatinine Ratio: 9 (ref 9–20)
BUN: 10 mg/dL (ref 6–24)
Bilirubin Total: 0.5 mg/dL (ref 0.0–1.2)
CO2: 26 mmol/L (ref 20–29)
Calcium: 9.3 mg/dL (ref 8.7–10.2)
Chloride: 103 mmol/L (ref 96–106)
Creatinine, Ser: 1.08 mg/dL (ref 0.76–1.27)
Globulin, Total: 2.8 g/dL (ref 1.5–4.5)
Glucose: 115 mg/dL — ABNORMAL HIGH (ref 70–99)
Potassium: 4.2 mmol/L (ref 3.5–5.2)
Sodium: 141 mmol/L (ref 134–144)
Total Protein: 6.6 g/dL (ref 6.0–8.5)
eGFR: 79 mL/min/{1.73_m2} (ref >59)

## 2023-08-24 LAB — PSA: Prostate Specific Ag, Serum: 0.3 ng/mL (ref 0.0–4.0)

## 2023-10-10 ENCOUNTER — Other Ambulatory Visit: Payer: Self-pay | Admitting: Family Medicine

## 2023-10-10 DIAGNOSIS — I10 Essential (primary) hypertension: Secondary | ICD-10-CM

## 2023-10-15 ENCOUNTER — Telehealth: Payer: Self-pay

## 2023-10-15 NOTE — Telephone Encounter (Signed)
Copied from CRM [id number]. Topic: Clinical - Medical Advice >> Oct 15, 2023 12:01 PM Sasha H wrote: Reason for CRM: Pt wants Dr.Matthews to know that he still has a sinus infection

## 2023-10-18 NOTE — Telephone Encounter (Signed)
Patient informed and schld for 10/22/23 with Dr. Tamera Punt.

## 2023-10-21 ENCOUNTER — Ambulatory Visit: Payer: Commercial Managed Care - PPO | Admitting: Family Medicine

## 2023-10-22 ENCOUNTER — Ambulatory Visit: Payer: Commercial Managed Care - PPO | Admitting: Family Medicine

## 2023-10-28 ENCOUNTER — Ambulatory Visit: Payer: Commercial Managed Care - PPO | Admitting: Family Medicine

## 2023-11-25 ENCOUNTER — Ambulatory Visit
Admission: EM | Admit: 2023-11-25 | Discharge: 2023-11-25 | Disposition: A | Discharge status: Home / Self Care | Payer: No Typology Code available for payment source | Attending: Emergency Medicine | Admitting: Emergency Medicine

## 2023-11-25 DIAGNOSIS — J101 Influenza due to other identified influenza virus with other respiratory manifestations: Secondary | ICD-10-CM

## 2023-11-25 DIAGNOSIS — J0100 Acute maxillary sinusitis, unspecified: Secondary | ICD-10-CM | POA: Diagnosis not present

## 2023-11-25 LAB — POC INFLUENZA A AND B ANTIGEN (URGENT CARE ONLY)
Influenza A Ag: POSITIVE — AB
Influenza B Ag: NEGATIVE

## 2023-11-25 MED ORDER — PROMETHAZINE-DM 6.25-15 MG/5ML PO SYRP: 5.0000 mL | ORAL_SOLUTION | Freq: Four times a day (QID) | ORAL | 0 refills | Status: DC | PRN

## 2023-11-25 MED ORDER — ACETAMINOPHEN 325 MG PO TABS
650.0000 mg | ORAL_TABLET | Freq: Once | ORAL | Status: AC
  Administered 2023-11-25: 650 mg via ORAL

## 2023-11-25 MED ORDER — AZITHROMYCIN 250 MG PO TABS: ORAL_TABLET | ORAL | 0 refills | Status: DC

## 2023-11-25 NOTE — Discharge Instructions (Addendum)
For sinus infection -- azithromycin as prescribed. Please note this will NOT treat your flu symptoms  For the flu -- alternate xylenol and ibuprofen every 4 hours as needed for fever and body aches. Drink lots of fluids. The promethazine DM cough syrup can be used up to 4 times daily. If this medication makes you drowsy, take only once before bed.

## 2023-11-25 NOTE — ED Provider Notes (Signed)
[name]    CSN: [id number] Arrival date & time: 11/25/23  1357      History   Chief Complaint Chief Complaint  Patient presents with   Cough    HPI Damon Conrad is a 58 y.o. male.  2 day history of fever, bodyaches, and cough Has been using delsym and tylenol. Last med 6 hours ago Tolerating fluids Possible sick contacts at work  Also reports 2 week history of persistent nasal congestion. Has worsened the last few days. Having sinus pressure. Reports frequent sinus infections  Past Medical History:  Diagnosis Date   GERD (gastroesophageal reflux disease)    Gout    HTN (hypertension), benign    Hyperlipidemia     Patient Active Problem List   Diagnosis Date Noted   Acute maxillary sinusitis 08/23/2023   Pain of left calf 12/13/2020   Chest pain 07/14/2020   COVID-19 virus infection 06/24/2020   Hyponatremia 06/14/2020   Elevated liver enzymes 06/08/2020   BMI 31.0-31.9,adult 12/16/2017   Haglund's deformity of right heel 09/15/2016   Chronic gout 02/21/2016   Right lumbar radiculopathy 02/25/2015   HLD (hyperlipidemia) 05/08/2014   Essential hypertension, benign 05/07/2014   GERD (gastroesophageal reflux disease) 05/07/2014    Past Surgical History:  Procedure Laterality Date   ABDOMINAL HERNIA REPAIR Bilateral    x 2   HERNIA REPAIR         Home Medications    Prior to Admission medications   Medication Sig Start Date End Date Taking? Authorizing Provider  azithromycin (ZITHROMAX) 250 MG tablet Take 2 tablets together on day 1, then take 1 tablet daily for 4 days 11/25/23  Yes Raffaela Ladley, Lurena Joiner, PA-C  promethazine-dextromethorphan (PROMETHAZINE-DM) 6.25-15 MG/5ML syrup Take 5 mLs by mouth 4 (four) times daily as needed for cough. 11/25/23  Yes Elida Harbin, Lurena Joiner, PA-C  fluticasone (FLONASE) 50 MCG/ACT nasal spray Place 1 spray into both nostrils daily. 06/20/23   Raspet, Noberto Retort, PA-C  metoprolol succinate (TOPROL-XL) 100 MG 24 hr tablet TAKE  1 TABLET(100 MG) BY MOUTH DAILY WITH OR IMMEDIATELY FOLLOWING A MEAL 10/11/23   Everrett Coombe, DO  omeprazole (PRILOSEC) 40 MG capsule TAKE 1 CAPSULE(40 MG) BY MOUTH DAILY 04/12/23   Everrett Coombe, DO  Pseudoephedrine-Ibuprofen 30-200 MG TABS Take by mouth.    [provider]  rosuvastatin (CRESTOR) 40 MG tablet Take 1 tablet (40 mg total) by mouth daily. 02/23/23   Lewayne Bunting, MD    Family History Family History  Problem Relation Age of Onset   Cancer Mother    Hypertension Mother    Alcohol abuse Father     Social History Social History   Tobacco Use   Smoking status: Never   Smokeless tobacco: Never  Vaping Use   Vaping status: Never Used  Substance Use Topics   Alcohol use: Yes    Comment: Rarely   Drug use: No     Allergies   Meloxicam and Lisinopril   Review of Systems Review of Systems Per HPI  Physical Exam Triage Vital Signs ED Triage Vitals  Encounter Vitals Group     BP 11/25/23 1413 115/73     Systolic BP Percentile --      Diastolic BP Percentile --      Pulse Rate 11/25/23 1413 85     Resp 11/25/23 1413 17     Temp 11/25/23 1413 (!) 101.4 F (38.6 C)     Temp Source 11/25/23 1413 Oral  SpO2 11/25/23 1413 99 %     Weight --      Height --      Head Circumference --      Peak Flow --      Pain Score 11/25/23 1412 2     Pain Loc --      Pain Education --      Exclude from Growth Chart --    No data found.  Updated Vital Signs BP 115/73 (BP Location: Right Arm)   Pulse 85   Temp (!) 100.8 F (38.2 C)   Resp 17   SpO2 99%   Physical Exam Vitals and nursing note reviewed.  Constitutional:      Appearance: He is not ill-appearing.  HENT:     Right Ear: Tympanic membrane and ear canal normal.     Left Ear: Tympanic membrane and ear canal normal.     Nose: Septal deviation (to left) and congestion present. No rhinorrhea.     Comments: Sinus tenderness    Mouth/Throat:     Mouth: Mucous membranes are moist.      Pharynx: Oropharynx is clear. Posterior oropharyngeal erythema (mild) present.  Eyes:     Conjunctiva/sclera: Conjunctivae normal.  Cardiovascular:     Rate and Rhythm: Normal rate and regular rhythm.     Pulses: Normal pulses.     Heart sounds: Normal heart sounds.  Pulmonary:     Effort: Pulmonary effort is normal.     Breath sounds: Normal breath sounds. No wheezing or rales.  Musculoskeletal:     Cervical back: Normal range of motion. No rigidity.  Lymphadenopathy:     Cervical: No cervical adenopathy.  Skin:    General: Skin is warm and dry.  Neurological:     Mental Status: He is alert and oriented to person, place, and time.     UC Treatments / Results  Labs (all labs ordered are listed, but only abnormal results are displayed) Labs Reviewed  POC INFLUENZA A AND B ANTIGEN (URGENT CARE ONLY) - Abnormal; Notable for the following components:      Result Value   Influenza A Ag Positive (*)    All other components within normal limits    EKG  Radiology No results found.  Procedures Procedures (including critical care time)  Medications Ordered in UC Medications  acetaminophen (TYLENOL) tablet 650 mg (650 mg Oral Given 11/25/23 1418)    Initial Impression / Assessment and Plan / UC Course  I have reviewed the triage vital signs and the nursing notes.  Pertinent labs & imaging results that were available during my care of the patient were reviewed by me and considered in my medical decision making (see chart for details).  Temp 101.4 on arrival, Tylenol dose given Rapid flu A is positive Discussed virus with patient, continue symptomatic care and OTC medications as needed. Discussed tamiflu although advised will not treat symptoms. Patient declines today.  With 2 weeks of nasal congestion, sinus pressure, recommend abx. He reports azithromycin works for him. Sent to pharmacy. Advised abx will not treat viral symptoms.  Return precautions   Final Clinical  Impressions(s) / UC Diagnoses   Final diagnoses:  Influenza A  Acute maxillary sinusitis, recurrence not specified     Discharge Instructions      For sinus infection -- azithromycin as prescribed. Please note this will NOT treat your flu symptoms  For the flu -- alternate xylenol and ibuprofen every 4 hours as needed for fever and  body aches. Drink lots of fluids. The promethazine DM cough syrup can be used up to 4 times daily. If this medication makes you drowsy, take only once before bed.      ED Prescriptions     Medication Sig Dispense Auth. Provider   promethazine-dextromethorphan (PROMETHAZINE-DM) 6.25-15 MG/5ML syrup Take 5 mLs by mouth 4 (four) times daily as needed for cough. 240 mL Brandolyn Shortridge, PA-C   azithromycin (ZITHROMAX) 250 MG tablet Take 2 tablets together on day 1, then take 1 tablet daily for 4 days 6 tablet Jeff Frieden, Lurena Joiner, PA-C      PDMP not reviewed this encounter.   Aleia Larocca, Lurena Joiner, [place] 11/25/23 [phone]

## 2023-11-25 NOTE — ED Triage Notes (Signed)
Pt c/o bodyaches, cough and fever x 3 days. Taking delsym and tylenol prn.

## 2023-11-30 ENCOUNTER — Ambulatory Visit: Payer: No Typology Code available for payment source | Admitting: Sports Medicine

## 2023-11-30 ENCOUNTER — Other Ambulatory Visit: Payer: No Typology Code available for payment source

## 2023-11-30 ENCOUNTER — Encounter: Payer: Self-pay | Admitting: Sports Medicine

## 2023-11-30 VITALS — BP 153/92 | HR 56 | Temp 97.7°F | Ht 67.0 in | Wt 222.0 lb

## 2023-11-30 DIAGNOSIS — J101 Influenza due to other identified influenza virus with other respiratory manifestations: Secondary | ICD-10-CM | POA: Insufficient documentation

## 2023-11-30 DIAGNOSIS — J36 Peritonsillar abscess: Secondary | ICD-10-CM | POA: Insufficient documentation

## 2023-11-30 MED ORDER — PREDNISONE 50 MG PO TABS: ORAL_TABLET | ORAL | 0 refills | Status: DC

## 2023-11-30 MED ORDER — HYDROCOD POLI-CHLORPHE POLI ER 10-8 MG/5ML PO SUER: 5.0000 mL | Freq: Two times a day (BID) | ORAL | 0 refills | Status: DC | PRN

## 2023-11-30 NOTE — Assessment & Plan Note (Signed)
Pleasant 58 year old male, recent diagnosis influenza A, not treated with Tamiflu, he was given azithromycin. He returns 5 days later with persistent symptoms, facial pain and pressure, sore throat, coughing, malaise. He does appear ill, he is however speaking full sentences, no respiratory distress, no nasal flaring or accessory muscle use. He does have a large swollen left tonsil, swelling in the left oropharynx. The rest of his head and neck exam is normal, lungs are clear. We are going to give him a course of steroids, due to the swelling in the left tonsillar region I would like a CT maxillofacial to ensure that were not missing a peritonsillar abscess.

## 2023-11-30 NOTE — Progress Notes (Addendum)
    Procedures performed today:    None.  Independent interpretation of notes and tests performed by another provider:   None.  Brief History, Exam, Impression, and Recommendations:    Influenza A Pleasant 58 year old male, recent diagnosis influenza A, not treated with Tamiflu, he was given azithromycin . He returns 5 days later with persistent symptoms, facial pain and pressure, sore throat, coughing, malaise. He does appear ill, he is however speaking full sentences, no respiratory distress, no nasal flaring or accessory muscle use. He does have a large swollen left tonsil, swelling in the left oropharynx. The rest of his head and neck exam is normal, lungs are clear. We are going to give him a course of steroids, due to the swelling in the left tonsillar region I would like a CT maxillofacial to ensure that were not missing a peritonsillar abscess.   Peritonsillar abscess Pleasant 58 year old male, recent diagnosis influenza A, not treated with Tamiflu, he was given azithromycin . He returns 5 days later with persistent symptoms, facial pain and pressure, sore throat, coughing, malaise. He does appear ill, he is however speaking full sentences, no respiratory distress, no nasal flaring or accessory muscle use. He does have a large swollen left tonsil, swelling in the left oropharynx. The rest of his head and neck exam is normal, lungs are clear. We are going to give him a course of steroids, due to the swelling in the left tonsillar region I would like a CT maxillofacial to ensure that were not missing a peritonsillar abscess.  CT reviewed: There does appear to be a low-density lesion in the left tonsil consistent with an abscess, we will add Augmentin  as the antibiotic. 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    ____________________________________________ Debby PARAS. Curtis, M.D., ABFM., CAQSM.,  AME. Primary Care and Sports Medicine [facility]  Adjunct Professor of [facility] [employer] Medicine  [employer]  [employer], [employer]

## 2023-12-01 ENCOUNTER — Ambulatory Visit: Payer: No Typology Code available for payment source

## 2023-12-01 DIAGNOSIS — K122 Cellulitis and abscess of mouth: Secondary | ICD-10-CM | POA: Diagnosis not present

## 2023-12-01 DIAGNOSIS — J101 Influenza due to other identified influenza virus with other respiratory manifestations: Secondary | ICD-10-CM

## 2023-12-01 MED ORDER — IOHEXOL 300 MG/ML  SOLN
100.0000 mL | Freq: Once | INTRAMUSCULAR | Status: AC | PRN
  Administered 2023-12-01: 75 mL via INTRAVENOUS

## 2023-12-02 ENCOUNTER — Encounter: Payer: Self-pay | Admitting: Sports Medicine

## 2023-12-02 MED ORDER — AMOXICILLIN-POT CLAVULANATE 875-125 MG PO TABS: 1.0000 | ORAL_TABLET | Freq: Two times a day (BID) | ORAL | 0 refills | Status: DC

## 2023-12-02 NOTE — Addendum Note (Signed)
Addended by: Monica Becton on: 12/02/2023 10:53 AM   Modules accepted: Orders

## 2023-12-07 ENCOUNTER — Encounter: Payer: Self-pay | Admitting: Emergency Medicine

## 2023-12-09 ENCOUNTER — Telehealth: Payer: Self-pay

## 2023-12-09 NOTE — Telephone Encounter (Signed)
 Copied from CRM [id number]. Topic: Clinical - Medical Advice >> Dec 09, 2023 10:21 AM Damon Conrad wrote: Reason for CRM: Patient states that cough from two weeks ago is persisting despite medication and treatment prescribed lat visit. Patient is requesting something additional to assist with quieting down the cough.

## 2023-12-10 ENCOUNTER — Ambulatory Visit: Payer: Self-pay | Admitting: Family Medicine

## 2023-12-10 NOTE — Telephone Encounter (Signed)
 Chief Complaint: worsening cough Symptoms: cough, sinus pressure, runny nose Frequency: 2 weeks Pertinent Negatives: Patient denies SOB, CP, Fever Disposition: [] ED /[] Urgent Care (no appt availability in office) / [x] Appointment(In office/virtual)/ []  [facility]/ [] Home Care/ [x] Refused Recommended Disposition /[] [facility]/ []  Follow-up with PCP Additional Notes: Patient called reporting lingering cough x 2 weeks and sinus pressure that began today. States he was treated two weeks ago for Flu A and feels he is not improving. Patient requesting antibiotics for sinus infection. Per protocol, patient to be evaluated within 4 hours. Offered in clinic visit, Patient requested virtual visit, first available 1645- states he cannot do that due to work. Patient states if his symptoms worsen he will go to urgent care for evaluation. Care advice reviewed, patient verbalized understanding. Alerting PCP for review.   Copied from CRM [id number]. Topic: Clinical - Medication Question >> Dec 10, 2023 11:02 AM Susanna ORN wrote: Reason for CRM: Patient called stating that he saw Dr. ONEIDA about two weeks ago. Patient states he still has the cold & is still coughing. He says he even went and bought more medicine but it's not helping. Woke up this morning congested. Patient wants to see if he can be prescribed something like a Z-pack or something else or he wants to know do he need to come and be seen again? Please give a call to patient to advise. CB #: [id number]. Reason for Disposition  [1] Continuous (nonstop) coughing interferes with work or school AND [2] no improvement using cough treatment per Care Advice  Answer Assessment - Initial Assessment Questions 1. ONSET: When did the cough begin?      Two weeks 2. SEVERITY: How bad is the cough today?      Not as bad today as yesterday- when he starts coughing he cannot stop. 3. SPUTUM: Describe the color of your sputum (none, dry  cough; clear, white, yellow, green)     Dry cough, occasional yellow or white. 4. HEMOPTYSIS: Are you coughing up any blood? If so ask: How much? (flecks, streaks, tablespoons, etc.)     Denies 5. DIFFICULTY BREATHING: Are you having difficulty breathing? If Yes, ask: How bad is it? (e.g., mild, moderate, severe)    - MILD: No SOB at rest, mild SOB with walking, speaks normally in sentences, can lie down, no retractions, pulse < 100.    - MODERATE: SOB at rest, SOB with minimal exertion and prefers to sit, cannot lie down flat, speaks in phrases, mild retractions, audible wheezing, pulse 100-120.    - SEVERE: Very SOB at rest, speaks in single words, struggling to breathe, sitting hunched forward, retractions, pulse > 120      Denies 6. FEVER: Do you have a fever? If Yes, ask: What is your temperature, how was it measured, and when did it start?     Denies 7. CARDIAC HISTORY: Do you have any history of heart disease? (e.g., heart attack, congestive heart failure)      Denies 8. LUNG HISTORY: Do you have any history of lung disease?  (e.g., pulmonary embolus, asthma, emphysema)     Denies 9. PE RISK FACTORS: Do you have a history of blood clots? (or: recent major surgery, recent prolonged travel, bedridden)     Denies 10. OTHER SYMPTOMS: Do you have any other symptoms? (e.g., runny nose, wheezing, chest pain)       Runny nose, sinus pressure that began this morning  12. TRAVEL: Have you traveled out of  the country in the last month? (e.g., travel history, exposures)       Denies  Protocols used: Cough - Acute Non-Productive-A-AH

## 2023-12-10 NOTE — Telephone Encounter (Signed)
 Attempted call to patient. Left a voice mail message requesting a return call.

## 2023-12-14 NOTE — Telephone Encounter (Signed)
Left message advising patient to return call

## 2023-12-14 NOTE — Telephone Encounter (Signed)
Attempted call to patient. Left a voice mail message requesting a return call.

## 2023-12-17 ENCOUNTER — Other Ambulatory Visit: Payer: Self-pay | Admitting: Family Medicine

## 2023-12-17 DIAGNOSIS — J101 Influenza due to other identified influenza virus with other respiratory manifestations: Secondary | ICD-10-CM

## 2023-12-17 MED ORDER — HYDROCOD POLI-CHLORPHE POLI ER 10-8 MG/5ML PO SUER: 5.0000 mL | Freq: Two times a day (BID) | ORAL | 0 refills | Status: DC | PRN

## 2023-12-17 MED ORDER — BENZONATATE 200 MG PO CAPS
200.0000 mg | ORAL_CAPSULE | Freq: Two times a day (BID) | ORAL | 0 refills | Status: DC | PRN
Start: 2023-12-17 — End: 2024-05-01

## 2023-12-17 NOTE — Telephone Encounter (Signed)
Spoke with patient. States that  he has tried two over the counter cough syrups and cough drops. Using home inhaler Still coughing somewhat during daytime - states it is worse at nighttime and awakens him from  sleep occasionally  Patient call back # [phone] o.k. to leave voice mail message on this number.

## 2023-12-17 NOTE — Telephone Encounter (Signed)
Called and left detailed voice mail message on patient cell number ( listed in message) - with number to office so he could call with any questions.

## 2024-01-15 ENCOUNTER — Other Ambulatory Visit: Payer: Self-pay | Admitting: Family Medicine

## 2024-01-16 ENCOUNTER — Other Ambulatory Visit: Payer: Self-pay | Admitting: Family Medicine

## 2024-01-16 DIAGNOSIS — K2100 Gastro-esophageal reflux disease with esophagitis, without bleeding: Secondary | ICD-10-CM

## 2024-02-21 ENCOUNTER — Ambulatory Visit: Payer: Commercial Managed Care - PPO | Admitting: Family Medicine

## 2024-02-23 ENCOUNTER — Ambulatory Visit: Admitting: Family Medicine

## 2024-03-01 ENCOUNTER — Other Ambulatory Visit: Payer: Self-pay | Admitting: Cardiology

## 2024-03-01 DIAGNOSIS — E7800 Pure hypercholesterolemia, unspecified: Secondary | ICD-10-CM

## 2024-03-03 ENCOUNTER — Other Ambulatory Visit: Payer: Self-pay

## 2024-03-03 DIAGNOSIS — E7800 Pure hypercholesterolemia, unspecified: Secondary | ICD-10-CM

## 2024-03-03 MED ORDER — ROSUVASTATIN CALCIUM 40 MG PO TABS
40.0000 mg | ORAL_TABLET | Freq: Every day | ORAL | 0 refills | Status: DC
Start: 2024-03-03 — End: 2024-04-11

## 2024-03-15 ENCOUNTER — Ambulatory Visit: Admitting: Family Medicine

## 2024-04-09 ENCOUNTER — Other Ambulatory Visit: Payer: Self-pay | Admitting: Cardiology

## 2024-04-09 DIAGNOSIS — E7800 Pure hypercholesterolemia, unspecified: Secondary | ICD-10-CM

## 2024-04-17 ENCOUNTER — Telehealth: Payer: Self-pay | Admitting: Family Medicine

## 2024-04-17 DIAGNOSIS — I10 Essential (primary) hypertension: Secondary | ICD-10-CM

## 2024-04-17 MED ORDER — METOPROLOL SUCCINATE ER 100 MG PO TB24: ORAL_TABLET | ORAL | 1 refills | Status: DC

## 2024-04-17 NOTE — Telephone Encounter (Signed)
 Copied from CRM [id number]. Topic: Clinical - Medication Refill >> Apr 17, 2024  9:20 AM Tisa Forester wrote: Medication:  Disp Refills Start End  metoprolol  succinate (TOPROL -XL) 100 MG 24 hr tablet      Has the patient contacted their pharmacy? Yes , pharmacy contacted provider twice no response  (Agent: If no, request that the patient contact the pharmacy for the refill. If patient does not wish to contact the pharmacy document the reason why and proceed with request.) (Agent: If yes, when and what did the pharmacy advise?)  This is the patient's preferred pharmacy:  [id number] - [place], [place] - [address] AT [facility] OF [address] [place] [address] Phone: [phone] Fax: [phone]  Is this the correct pharmacy for this prescription? Yes If no, delete pharmacy and type the correct one.   Has the prescription been filled recently? No  Is the patient out of the medication? Yes , been out of medication for 4 days now  Patient want to know if provider can prescribe enough medication , patient will not be able to get an appointment until around 05/01/24 his spouse is in the hospital  Has the patient been seen for an appointment in the last year OR does the patient have an upcoming appointment? Yes  Can we respond through MyChart? Yes  Agent: Please be advised that Rx refills may take up to 3 business days. We ask that you follow-up with your pharmacy.

## 2024-05-01 ENCOUNTER — Encounter: Payer: Self-pay | Admitting: Family Medicine

## 2024-05-01 ENCOUNTER — Ambulatory Visit: Admitting: Family Medicine

## 2024-05-02 ENCOUNTER — Encounter: Payer: Self-pay | Admitting: Family Medicine

## 2024-05-10 ENCOUNTER — Other Ambulatory Visit: Payer: Self-pay | Admitting: Cardiology

## 2024-05-10 DIAGNOSIS — E7800 Pure hypercholesterolemia, unspecified: Secondary | ICD-10-CM

## 2024-05-27 ENCOUNTER — Other Ambulatory Visit: Payer: Self-pay | Admitting: Cardiology

## 2024-05-27 DIAGNOSIS — E7800 Pure hypercholesterolemia, unspecified: Secondary | ICD-10-CM

## 2024-05-29 ENCOUNTER — Ambulatory Visit
Admission: EM | Admit: 2024-05-29 | Discharge: 2024-05-29 | Disposition: A | Discharge status: Home / Self Care | Attending: Family Medicine | Admitting: Family Medicine

## 2024-05-29 ENCOUNTER — Ambulatory Visit: Admitting: Family Medicine

## 2024-05-29 ENCOUNTER — Telehealth: Payer: Self-pay | Admitting: Cardiology

## 2024-05-29 DIAGNOSIS — E7800 Pure hypercholesterolemia, unspecified: Secondary | ICD-10-CM

## 2024-05-29 DIAGNOSIS — J22 Unspecified acute lower respiratory infection: Secondary | ICD-10-CM | POA: Diagnosis not present

## 2024-05-29 MED ORDER — AZITHROMYCIN 250 MG PO TABS: ORAL_TABLET | ORAL | 0 refills | Status: DC

## 2024-05-29 MED ORDER — ROSUVASTATIN CALCIUM 40 MG PO TABS: 40.0000 mg | ORAL_TABLET | Freq: Every day | ORAL | 1 refills | Status: DC

## 2024-05-29 MED ORDER — PREDNISONE 20 MG PO TABS: 20.0000 mg | ORAL_TABLET | Freq: Two times a day (BID) | ORAL | 0 refills | Status: DC

## 2024-05-29 MED ORDER — HYDROCOD POLI-CHLORPHE POLI ER 10-8 MG/5ML PO SUER: 5.0000 mL | Freq: Two times a day (BID) | ORAL | 0 refills | Status: DC | PRN

## 2024-05-29 NOTE — Telephone Encounter (Signed)
*  STAT* If patient is at the pharmacy, call can be transferred to refill team.   1. Which medications need to be refilled? (please list name of each medication and dose if known)  rosuvastatin  (CRESTOR ) 40 MG tablet    2. Would you like to learn more about the convenience, safety, & potential cost savings by using the [facility]?    3. Are you open to using the [facility] (Type [facility].  ).   4. Which pharmacy/location (including street and city if local pharmacy) is medication to be sent to? [id number] - [place], [place] - [address] AT [address]    5. Do they need a 30 day or 90 day supply? 90 day Patient is scheduled for 09/27/24 with Dr. Pietro.

## 2024-05-29 NOTE — Discharge Instructions (Signed)
 Make sure you are drinking lots of fluids Take the Z-Pak as directed, 2 pills today then 1 a day until gone Take prednisone  daily for 5 days I have given you a cough medicine to use at nighttime.  This will cause drowsiness during the day.  Take Mucinex  or Delsym during the day See your doctor if not improving by next week

## 2024-05-29 NOTE — ED Provider Notes (Signed)
 [facility]    CSN: [id number] Arrival date & time: 05/29/24  1426      History   Chief Complaint Chief Complaint  Patient presents with   Sore Throat   Nasal Congestion    HPI Damon Conrad is a 58 y.o. male.   HPI  Patient states he has had a cough for over a week.  Some runny nose and sore throat.  He states he had some fever chills and bodyaches.  He states his symptoms are persisting and the cough is worsening.  He is coughing up some mucus.  He states last time he felt like this he was seen and given a Z-Pak.  He states that this helped him. Patient is a non-smoker He did a COVID test at home that was negative Is under treatment for well-controlled hypertension and hyperlipidemia  Past Medical History:  Diagnosis Date   GERD (gastroesophageal reflux disease)    Gout    HTN (hypertension), benign    Hyperlipidemia     Patient Active Problem List   Diagnosis Date Noted   Influenza A 11/30/2023   Peritonsillar abscess 11/30/2023   Acute maxillary sinusitis 08/23/2023   Pain of left calf 12/13/2020   Chest pain 07/14/2020   COVID-19 virus infection 06/24/2020   Hyponatremia 06/14/2020   Elevated liver enzymes 06/08/2020   BMI 31.0-31.9,adult 12/16/2017   Haglund's deformity of right heel 09/15/2016   Chronic gout 02/21/2016   Right lumbar radiculopathy 02/25/2015   HLD (hyperlipidemia) 05/08/2014   Essential hypertension, benign 05/07/2014   GERD (gastroesophageal reflux disease) 05/07/2014    Past Surgical History:  Procedure Laterality Date   ABDOMINAL HERNIA REPAIR Bilateral    x 2   HERNIA REPAIR         Home Medications    Prior to Admission medications   Medication Sig Start Date End Date Taking? Authorizing Provider  azithromycin  (ZITHROMAX  Z-PAK) 250 MG tablet Take 2 pills right away.  After this take 1 pill a day until gone 05/29/24  Yes Maranda Jamee Jacob, MD  chlorpheniramine-HYDROcodone  (TUSSIONEX) 10-8 MG/5ML Take 5 mLs by  mouth every 12 (twelve) hours as needed for cough. 05/29/24  Yes Maranda Jamee Jacob, MD  predniSONE  (DELTASONE ) 20 MG tablet Take 1 tablet (20 mg total) by mouth 2 (two) times daily with a meal. 05/29/24  Yes Maranda Jamee Jacob, MD  albuterol  (VENTOLIN  HFA) 108 [phone] Base) MCG/ACT inhaler INHALE 2 PUFFS INTO THE LUNGS EVERY 6 HOURS AS NEEDED FOR WHEEZING OR SHORTNESS OF BREATH 01/19/24   Alvia Bring, DO  fluticasone  (FLONASE ) 50 MCG/ACT nasal spray Place 1 spray into both nostrils daily. 06/20/23   Raspet, Erin K, PA-C  metoprolol  succinate (TOPROL -XL) 100 MG 24 hr tablet TAKE 1 TABLET(100 MG) BY MOUTH DAILY WITH OR IMMEDIATELY FOLLOWING A MEAL 04/17/24   Alvia Bring, DO  omeprazole  (PRILOSEC) 40 MG capsule TAKE 1 CAPSULE(40 MG) BY MOUTH DAILY 01/19/24   Alvia Bring, DO  Pseudoephedrine-Ibuprofen 30-200 MG TABS Take by mouth.    [provider]  rosuvastatin  (CRESTOR ) 40 MG tablet Take 1 tablet (40 mg total) by mouth daily. 05/29/24   Pietro Redell RAMAN, MD    Family History Family History  Problem Relation Age of Onset   Cancer Mother    Hypertension Mother    Alcohol abuse Father     Social History Social History   Tobacco Use   Smoking status: Never   Smokeless tobacco: Never  Vaping Use   Vaping  status: Never Used  Substance Use Topics   Alcohol use: Yes    Comment: Rarely   Drug use: No     Allergies   Meloxicam  and Lisinopril    Review of Systems Review of Systems See HPI  Physical Exam Triage Vital Signs ED Triage Vitals  Encounter Vitals Group     BP 05/29/24 1434 (!) 151/98     Girls Systolic BP Percentile --      Girls Diastolic BP Percentile --      Boys Systolic BP Percentile --      Boys Diastolic BP Percentile --      Pulse Rate 05/29/24 1434 60     Resp 05/29/24 1434 17     Temp 05/29/24 1434 98.2 F (36.8 C)     Temp Source 05/29/24 1433 Oral     SpO2 05/29/24 1434 97 %     Weight --      Height --      Head Circumference --      Peak  Flow --      Pain Score 05/29/24 1435 5     Pain Loc --      Pain Education --      Exclude from Growth Chart --    No data found.  Updated Vital Signs BP (!) 151/98 (BP Location: Right Arm)   Pulse 60   Temp 98.2 F (36.8 C) (Oral)   Resp 17   SpO2 97%       Physical Exam Constitutional:      General: He is not in acute distress.    Appearance: He is well-developed. He is ill-appearing.     Comments: Appears tired.  Moderate overweight.  HENT:     Head: Normocephalic and atraumatic.     Right Ear: Tympanic membrane normal.     Left Ear: Tympanic membrane normal.     Nose: Congestion present. No rhinorrhea.     Mouth/Throat:     Tonsils: No tonsillar exudate. 0 on the right. 1+ on the left.  Eyes:     Conjunctiva/sclera: Conjunctivae normal.     Pupils: Pupils are equal, round, and reactive to light.  Cardiovascular:     Rate and Rhythm: Normal rate and regular rhythm.  Pulmonary:     Effort: Pulmonary effort is normal. No respiratory distress.     Breath sounds: Normal breath sounds.  Abdominal:     General: There is no distension.     Palpations: Abdomen is soft.  Musculoskeletal:        General: Normal range of motion.     Cervical back: Normal range of motion and neck supple.  Skin:    General: Skin is warm and dry.  Neurological:     Mental Status: He is alert.      UC Treatments / Results  Labs (all labs ordered are listed, but only abnormal results are displayed) Labs Reviewed - No data to display  EKG   Radiology No results found.  Procedures Procedures (including critical care time)  Medications Ordered in UC Medications - No data to display  Initial Impression / Assessment and Plan / UC Course  I have reviewed the triage vital signs and the nursing notes.  Pertinent labs & imaging results that were available during my care of the patient were reviewed by me and considered in my medical decision making (see chart for details).      Final Clinical Impressions(s) / UC Diagnoses   Final diagnoses:  LRTI (lower respiratory tract infection)     Discharge Instructions      Make sure you are drinking lots of fluids Take the Z-Pak as directed, 2 pills today then 1 a day until gone Take prednisone  daily for 5 days I have given you a cough medicine to use at nighttime.  This will cause drowsiness during the day.  Take Mucinex  or Delsym during the day See your doctor if not improving by next week   ED Prescriptions     Medication Sig Dispense Auth. Provider   predniSONE  (DELTASONE ) 20 MG tablet Take 1 tablet (20 mg total) by mouth 2 (two) times daily with a meal. 10 tablet Maranda Jamee Jacob, MD   azithromycin  (ZITHROMAX  Z-PAK) 250 MG tablet Take 2 pills right away.  After this take 1 pill a day until gone 6 tablet Maranda Jamee Jacob, MD   chlorpheniramine-HYDROcodone  (TUSSIONEX) 10-8 MG/5ML Take 5 mLs by mouth every 12 (twelve) hours as needed for cough. 115 mL Maranda Jamee Jacob, MD      I have reviewed the PDMP during this encounter.   Maranda Jamee Jacob, MD 05/29/24 [phone]

## 2024-05-29 NOTE — ED Triage Notes (Signed)
 Pt c/o sore throat and runny nose x 1 week. No known fever. Cough started Thurs. Tylenol  and nyquil prn.

## 2024-06-08 ENCOUNTER — Telehealth: Payer: Self-pay

## 2024-06-08 MED ORDER — BENZONATATE 200 MG PO CAPS: 200.0000 mg | ORAL_CAPSULE | Freq: Three times a day (TID) | ORAL | 0 refills | Status: DC | PRN

## 2024-06-08 NOTE — Telephone Encounter (Signed)
 Dr. Maranda sent tessalon  to pharmacy. Pt called and voicemail left.

## 2024-06-08 NOTE — Telephone Encounter (Signed)
 Pt still having coughing, wants rx for tesslon. Dr. Maranda messaged and notified.

## 2024-06-08 NOTE — Telephone Encounter (Signed)
 Patient desires tessalon  Sent to pharmacy

## 2024-07-04 ENCOUNTER — Encounter: Payer: Self-pay | Admitting: Sports Medicine

## 2024-07-20 ENCOUNTER — Telehealth: Payer: Self-pay | Admitting: Cardiology

## 2024-07-20 DIAGNOSIS — E7800 Pure hypercholesterolemia, unspecified: Secondary | ICD-10-CM

## 2024-07-20 NOTE — Telephone Encounter (Signed)
 Pt is requesting a callback regarding him wanting to know if he can have lab order paperwork mailed to him since it was done before so that he can have labs done before his November appt. Please advise

## 2024-07-20 NOTE — Telephone Encounter (Signed)
 Called and spoke with patient. Patient states its been over a year since doing labs and would like to know if he needs to have labs before he sees Dr. Pietro 09/27/24. Patient states he started cholesterol medication and would like to see how that is doing. Patient states he would like lab orders mailed to him like they have been in the past so he can get done before his visit. Advised patient this note would be routed to Dr. Pietro to advise on if patient needs to have labs ordered and completed before his visit. Patient verbalized understanding and agreeable to plan.

## 2024-07-24 NOTE — Telephone Encounter (Signed)
 Spoke with pt, aware will mail the paperwork for the lab work needed.

## 2024-09-20 NOTE — Progress Notes (Signed)
 HPI: Follow-up coronary artery disease. Exercise treadmill August 2015 normal. Echocardiogram at [facility] August 2021 showed normal LV function and mild tricuspid regurgitation.  Coronary CTA April 2024 showed calcium  score 61.4 (66 percentile), acute marginal branch off of the right coronary artery with 50% plaque and minimal stenosis in the LAD.  Since last seen patient recently lost his wife and is grieving appropriately.  He denies dyspnea, chest pain or syncope.  Current Outpatient Medications  Medication Sig Dispense Refill   albuterol  (VENTOLIN  HFA) 108 (90 Base) MCG/ACT inhaler INHALE 2 PUFFS INTO THE LUNGS EVERY 6 HOURS AS NEEDED FOR WHEEZING OR SHORTNESS OF BREATH 18 g 0   metoprolol  succinate (TOPROL -XL) 100 MG 24 hr tablet TAKE 1 TABLET(100 MG) BY MOUTH DAILY WITH OR IMMEDIATELY FOLLOWING A MEAL 90 tablet 1   omeprazole  (PRILOSEC) 40 MG capsule TAKE 1 CAPSULE(40 MG) BY MOUTH DAILY 90 capsule 2   Pseudoephedrine-Ibuprofen 30-200 MG TABS Take by mouth.     rosuvastatin  (CRESTOR ) 40 MG tablet Take 1 tablet (40 mg total) by mouth daily. 90 tablet 1   No current facility-administered medications for this visit.     Past Medical History:  Diagnosis Date   GERD (gastroesophageal reflux disease)    Gout    HTN (hypertension), benign    Hyperlipidemia     Past Surgical History:  Procedure Laterality Date   ABDOMINAL HERNIA REPAIR Bilateral    x 2   HERNIA REPAIR      Social History   Socioeconomic History   Marital status: Married    Spouse name: Not on file   Number of children: Not on file   Years of education: Not on file   Highest education level: Not on file  Occupational History   Not on file  Tobacco Use   Smoking status: Never   Smokeless tobacco: Never  Vaping Use   Vaping status: Never Used  Substance and Sexual Activity   Alcohol use: Yes    Comment: Rarely   Drug use: No   Sexual activity: Yes    Partners: Female  Other Topics Concern   Not on  file  Social History Narrative   Not on file   Social Drivers of Health   Financial Resource Strain: Low Risk  (06/16/2020)   Received from [facility]   Overall Financial Resource Strain (CARDIA)    Difficulty of Paying Living Expenses: Not very hard  Food Insecurity: Not on file  Transportation Needs: Not on file  Physical Activity: Not on file  Stress: No Stress Concern Present (06/16/2020)   Received from [facility] - Occupational Stress Questionnaire    Feeling of Stress : Only a little  Social Connections: Unknown (03/13/2022)   Received from [facility]   Social Network    Social Network: Not on file  Intimate Partner Violence: Unknown (02/02/2022)   Received from [facility]   HITS    Physically Hurt: Not on file    Insult or Talk Down To: Not on file    Threaten Physical Harm: Not on file    Scream or Curse: Not on file    Family History  Problem Relation Age of Onset   Cancer Mother    Hypertension Mother    Alcohol abuse Father     ROS: no fevers or chills, productive cough, hemoptysis, dysphasia, odynophagia, melena, hematochezia, dysuria, hematuria, rash, seizure activity, orthopnea, PND, pedal edema, claudication. Remaining systems  are negative.  Physical Exam: Well-developed well-nourished in no acute distress.  Skin is warm and dry.  HEENT is normal.  Neck is supple.  Chest is clear to auscultation with normal expansion.  Cardiovascular exam is regular rate and rhythm.  Abdominal exam nontender or distended. No masses palpated. Extremities show no edema. neuro grossly intact  EKG Interpretation Date/Time:  Wednesday September 27 2024 15:24:00 EST Ventricular Rate:  69 PR Interval:  160 QRS Duration:  90 QT Interval:  400 QTC Calculation: 428 R Axis:   95  Text Interpretation: Normal sinus rhythm Rightward axis No previous ECGs available Confirmed by Pietro Rogue ([id number]) on 09/27/2024 3:26:54 PM     A/P  1 coronary artery disease-mild on previous CTA.  Plan medical therapy.  Continue aspirin  and statin.  He denies exertional chest pain.  2 hyperlipidemia-continue statin.  3 hypertension-patient's blood pressure is controlled.  Continue present medications.  Rogue Pietro, MD

## 2024-09-21 ENCOUNTER — Ambulatory Visit: Payer: Self-pay | Admitting: Cardiology

## 2024-09-21 LAB — HEPATIC FUNCTION PANEL
ALT: 18 IU/L (ref 0–44)
AST: 20 IU/L (ref 0–40)
Albumin: 4.3 g/dL (ref 3.8–4.9)
Alkaline Phosphatase: 81 IU/L (ref 47–123)
Bilirubin Total: 0.7 mg/dL (ref 0.0–1.2)
Bilirubin, Direct: 0.22 mg/dL (ref 0.00–0.40)
Total Protein: 7.1 g/dL (ref 6.0–8.5)

## 2024-09-21 LAB — LIPID PANEL
Chol/HDL Ratio: 3 ratio (ref 0.0–5.0)
Cholesterol, Total: 100 mg/dL (ref 100–199)
HDL: 33 mg/dL — ABNORMAL LOW (ref >39)
LDL Chol Calc (NIH): 43 mg/dL (ref 0–99)
Triglycerides: 134 mg/dL (ref 0–149)
VLDL Cholesterol Cal: 24 mg/dL (ref 5–40)

## 2024-09-27 ENCOUNTER — Encounter: Payer: Self-pay | Admitting: Cardiology

## 2024-09-27 ENCOUNTER — Ambulatory Visit: Admitting: Cardiology

## 2024-09-27 VITALS — BP 115/75 | HR 69 | Ht 67.0 in | Wt 205.0 lb

## 2024-09-27 DIAGNOSIS — I2510 Atherosclerotic heart disease of native coronary artery without angina pectoris: Secondary | ICD-10-CM | POA: Diagnosis not present

## 2024-09-27 DIAGNOSIS — I10 Essential (primary) hypertension: Secondary | ICD-10-CM

## 2024-09-27 DIAGNOSIS — K2100 Gastro-esophageal reflux disease with esophagitis, without bleeding: Secondary | ICD-10-CM

## 2024-09-27 DIAGNOSIS — E7800 Pure hypercholesterolemia, unspecified: Secondary | ICD-10-CM | POA: Diagnosis not present

## 2024-09-27 MED ORDER — ROSUVASTATIN CALCIUM 40 MG PO TABS: 40.0000 mg | ORAL_TABLET | Freq: Every day | ORAL | 3 refills | Status: AC

## 2024-09-27 MED ORDER — METOPROLOL SUCCINATE ER 100 MG PO TB24: ORAL_TABLET | ORAL | 3 refills | Status: AC

## 2024-09-27 MED ORDER — OMEPRAZOLE 40 MG PO CPDR: DELAYED_RELEASE_CAPSULE | ORAL | 2 refills | Status: AC

## 2024-09-27 NOTE — Patient Instructions (Signed)

## 2024-10-01 ENCOUNTER — Other Ambulatory Visit: Payer: Self-pay

## 2024-10-01 ENCOUNTER — Ambulatory Visit
Admission: RE | Admit: 2024-10-01 | Discharge: 2024-10-01 | Disposition: A | Discharge status: Home / Self Care | Source: Ambulatory Visit | Attending: Internal Medicine | Admitting: Internal Medicine

## 2024-10-01 VITALS — BP 171/95 | HR 67 | Temp 98.1°F | Resp 16

## 2024-10-01 DIAGNOSIS — R0981 Nasal congestion: Secondary | ICD-10-CM

## 2024-10-01 DIAGNOSIS — J029 Acute pharyngitis, unspecified: Secondary | ICD-10-CM

## 2024-10-01 DIAGNOSIS — R051 Acute cough: Secondary | ICD-10-CM

## 2024-10-01 LAB — POCT RAPID STREP A (OFFICE): Rapid Strep A Screen: NEGATIVE

## 2024-10-01 MED ORDER — BENZONATATE 100 MG PO CAPS: 100.0000 mg | ORAL_CAPSULE | Freq: Three times a day (TID) | ORAL | 0 refills | Status: AC | PRN

## 2024-10-01 MED ORDER — AZITHROMYCIN 250 MG PO TABS: ORAL_TABLET | ORAL | 0 refills | Status: AC

## 2024-10-01 MED ORDER — PREDNISONE 20 MG PO TABS: 20.0000 mg | ORAL_TABLET | Freq: Every day | ORAL | 0 refills | Status: AC

## 2024-10-01 NOTE — ED Triage Notes (Signed)
 C/o cough, ha, rhinorrhea, sore throat since Monday. No fever. Has had advil for cold/sinus, mucinex , robitussin, tylenol .

## 2024-10-01 NOTE — ED Provider Notes (Signed)
 [name]    CSN: [id number] Arrival date & time: 10/01/24  1028      History   Chief Complaint Chief Complaint  Patient presents with   Cough   Sore Throat    HPI Damon Conrad is a 58 y.o. male.   Patient presents with 6 to 7-day history of cough, headache, runny nose, sore throat.  Denies any obvious known sick contacts.  Denies fever but does report chills.  Patient has taken Advil Cold and Sinus, Mucinex , Robitussin, Tylenol  with minimal improvement in symptoms.  Denies history of asthma or COPD.  Denies smoking history.  Blood pressure is slightly elevated, and he has been taking his blood pressure medication as prescribed.  He is not reporting any chest pain, shortness of breath, dizziness, blurred vision.  Patient is requesting azithromycin , prednisone , cough medication.   Cough Sore Throat    Past Medical History:  Diagnosis Date   GERD (gastroesophageal reflux disease)    Gout    HTN (hypertension), benign    Hyperlipidemia     Patient Active Problem List   Diagnosis Date Noted   Influenza A 11/30/2023   Peritonsillar abscess 11/30/2023   Acute maxillary sinusitis 08/23/2023   Pain of left calf 12/13/2020   Chest pain 07/14/2020   COVID-19 virus infection 06/24/2020   Hyponatremia 06/14/2020   Elevated liver enzymes 06/08/2020   BMI 31.0-31.9,adult 12/16/2017   Haglund's deformity of right heel 09/15/2016   Chronic gout 02/21/2016   Right lumbar radiculopathy 02/25/2015   HLD (hyperlipidemia) 05/08/2014   Essential hypertension, benign 05/07/2014   GERD (gastroesophageal reflux disease) 05/07/2014    Past Surgical History:  Procedure Laterality Date   ABDOMINAL HERNIA REPAIR Bilateral    x 2   HERNIA REPAIR         Home Medications    Prior to Admission medications   Medication Sig Start Date End Date Taking? Authorizing Provider  azithromycin  (ZITHROMAX ) 250 MG tablet Take 2 tablets (500 mg total) by mouth daily for 1 day,  THEN 1 tablet (250 mg total) daily for 4 days. Take first 2 tablets together, then 1 every day until finished. 10/01/24 10/06/24 Yes Bonnee Zertuche, Darryle BRAVO, FNP  benzonatate  (TESSALON ) 100 MG capsule Take 1 capsule (100 mg total) by mouth every 8 (eight) hours as needed for cough. 10/01/24  Yes Izreal Kock, Darryle E, FNP  predniSONE  (DELTASONE ) 20 MG tablet Take 1 tablet (20 mg total) by mouth daily for 5 days. 10/01/24 10/06/24 Yes Chou Busler, Darryle E, FNP  albuterol  (VENTOLIN  HFA) 108 (90 Base) MCG/ACT inhaler INHALE 2 PUFFS INTO THE LUNGS EVERY 6 HOURS AS NEEDED FOR WHEEZING OR SHORTNESS OF BREATH 01/19/24   Alvia Bring, DO  metoprolol  succinate (TOPROL -XL) 100 MG 24 hr tablet TAKE 1 TABLET(100 MG) BY MOUTH DAILY WITH OR IMMEDIATELY FOLLOWING A MEAL 09/27/24   Pietro Redell RAMAN, MD  omeprazole  (PRILOSEC) 40 MG capsule TAKE 1 CAPSULE(40 MG) BY MOUTH DAILY 09/27/24   Pietro Redell RAMAN, MD  Pseudoephedrine-Ibuprofen 30-200 MG TABS Take by mouth.    [provider]  rosuvastatin  (CRESTOR ) 40 MG tablet Take 1 tablet (40 mg total) by mouth daily. 09/27/24   Pietro Redell RAMAN, MD    Family History Family History  Problem Relation Age of Onset   Cancer Mother    Hypertension Mother    Alcohol abuse Father     Social History Social History   Tobacco Use   Smoking status: Never   Smokeless tobacco: Never  Vaping Use   Vaping status: Never Used  Substance Use Topics   Alcohol use: Yes    Comment: Rarely   Drug use: No     Allergies   Meloxicam  and Lisinopril    Review of Systems Review of Systems Per HPI  Physical Exam Triage Vital Signs ED Triage Vitals  Encounter Vitals Group     BP 10/01/24 1036 (!) 171/95     Girls Systolic BP Percentile --      Girls Diastolic BP Percentile --      Boys Systolic BP Percentile --      Boys Diastolic BP Percentile --      Pulse Rate 10/01/24 1036 67     Resp 10/01/24 1036 16     Temp 10/01/24 1036 98.1 F (36.7 C)     Temp src --      SpO2  10/01/24 1036 97 %     Weight --      Height --      Head Circumference --      Peak Flow --      Pain Score 10/01/24 1039 8     Pain Loc --      Pain Education --      Exclude from Growth Chart --    No data found.  Updated Vital Signs BP (!) 171/95   Pulse 67   Temp 98.1 F (36.7 C)   Resp 16   SpO2 97%   Visual Acuity Right Eye Distance:   Left Eye Distance:   Bilateral Distance:    Right Eye Near:   Left Eye Near:    Bilateral Near:     Physical Exam Constitutional:      General: He is not in acute distress.    Appearance: Normal appearance. He is not toxic-appearing or diaphoretic.  HENT:     Head: Normocephalic and atraumatic.     Right Ear: Tympanic membrane and ear canal normal.     Left Ear: Tympanic membrane and ear canal normal.     Nose: Congestion present.     Mouth/Throat:     Mouth: Mucous membranes are moist.     Pharynx: Posterior oropharyngeal erythema present. No pharyngeal swelling or oropharyngeal exudate.     Tonsils: No tonsillar exudate or tonsillar abscesses. 1+ on the right. 1+ on the left.  Eyes:     Extraocular Movements: Extraocular movements intact.     Conjunctiva/sclera: Conjunctivae normal.     Pupils: Pupils are equal, round, and reactive to light.  Cardiovascular:     Rate and Rhythm: Normal rate and regular rhythm.     Pulses: Normal pulses.     Heart sounds: Normal heart sounds.  Pulmonary:     Effort: Pulmonary effort is normal. No respiratory distress.     Breath sounds: Normal breath sounds. No stridor. No wheezing, rhonchi or rales.  Musculoskeletal:        General: Normal range of motion.     Cervical back: Normal range of motion.  Skin:    General: Skin is warm and dry.  Neurological:     General: No focal deficit present.     Mental Status: He is alert and oriented to person, place, and time. Mental status is at baseline.  Psychiatric:        Mood and Affect: Mood normal.        Behavior: Behavior normal.       UC Treatments / Results  Labs (all labs ordered  are listed, but only abnormal results are displayed) Labs Reviewed  POCT RAPID STREP A (OFFICE) - Normal  CULTURE, GROUP A STREP [facility])    EKG   Radiology No results found.  Procedures Procedures (including critical care time)  Medications Ordered in UC Medications - No data to display  Initial Impression / Assessment and Plan / UC Course  I have reviewed the triage vital signs and the nursing notes.  Pertinent labs & imaging results that were available during my care of the patient were reviewed by me and considered in my medical decision making (see chart for details).     Suspect viral cause to symptoms but given duration of symptoms and symptoms being refractory to over-the-counter medications will opt to treat with azithromycin  to cover for atypical infection.  Patient also requesting course of prednisone  which I do think is reasonable to decrease inflammation so low-dose and short course of prednisone  was prescribed.  Patient has taken both of these medications previously and tolerated well.  Benzonatate  prescribed to take as needed for coughing.  Rapid strep completed that was negative.  Throat culture pending.  Viral testing deferred given duration of symptoms as it would not change treatment.  There are no adventitious lungs on exam so chest imaging was deferred.  Blood pressure elevated today but suspect this could be due to patient taking over-the-counter cold medications.  Patient encouraged to monitor at home and follow-up with PCP or urgent care if it remains elevated.  No signs of hypertensive urgency on exam.  Patient was advised of strict return precautions if symptoms persist or worsen.  Patient verbalized understanding and was agreeable with plan. Final Clinical Impressions(s) / UC Diagnoses   Final diagnoses:  Sore throat  Nasal congestion  Acute cough     Discharge Instructions      Strep test was  negative.  Throat culture is pending.  I have prescribed you 3 medications to help with symptoms.  Follow-up if any symptoms persist or worsen.     ED Prescriptions     Medication Sig Dispense Auth. Provider   azithromycin  (ZITHROMAX ) 250 MG tablet Take 2 tablets (500 mg total) by mouth daily for 1 day, THEN 1 tablet (250 mg total) daily for 4 days. Take first 2 tablets together, then 1 every day until finished. 6 tablet [place], Shakema Surita E, [place]   benzonatate  (TESSALON ) 100 MG capsule Take 1 capsule (100 mg total) by mouth every 8 (eight) hours as needed for cough. 21 capsule [place], Amorita E, FNP   predniSONE  (DELTASONE ) 20 MG tablet Take 1 tablet (20 mg total) by mouth daily for 5 days. 5 tablet [place], Darryle BRAVO, [place]      PDMP not reviewed this encounter.   Hazen Darryle BRAVO, [place] 10/01/24 [phone]

## 2024-10-01 NOTE — Discharge Instructions (Signed)
 Strep test was negative.  Throat culture is pending.  I have prescribed you 3 medications to help with symptoms.  Follow-up if any symptoms persist or worsen.

## 2024-10-04 LAB — CULTURE, GROUP A STREP (THRC)

## 2024-10-06 ENCOUNTER — Telehealth: Payer: Self-pay

## 2024-10-06 NOTE — Telephone Encounter (Signed)
 Pt called stating he's still having some congestion after finishing abx. Per provider on staff can try OTC Flonase  and allegra or zyrtec. If no improvement by Monday, f/u with PCP or here for re-eval.
